# Patient Record
Sex: Female | Born: 1960 | Race: White | Hispanic: No | Marital: Married | Smoking: Never smoker
Health system: Southern US, Community
[De-identification: ages and names within clinical notes are randomized; demographics above are authoritative.]

## PROBLEM LIST (undated history)

## (undated) DIAGNOSIS — M1990 Unspecified osteoarthritis, unspecified site: Secondary | ICD-10-CM

## (undated) DIAGNOSIS — G47419 Narcolepsy without cataplexy: Secondary | ICD-10-CM

## (undated) DIAGNOSIS — G43909 Migraine, unspecified, not intractable, without status migrainosus: Secondary | ICD-10-CM

## (undated) DIAGNOSIS — R351 Nocturia: Secondary | ICD-10-CM

## (undated) DIAGNOSIS — R112 Nausea with vomiting, unspecified: Secondary | ICD-10-CM

## (undated) DIAGNOSIS — E569 Vitamin deficiency, unspecified: Secondary | ICD-10-CM

## (undated) DIAGNOSIS — M329 Systemic lupus erythematosus, unspecified: Secondary | ICD-10-CM

## (undated) DIAGNOSIS — G4710 Hypersomnia, unspecified: Secondary | ICD-10-CM

## (undated) DIAGNOSIS — M8580 Other specified disorders of bone density and structure, unspecified site: Secondary | ICD-10-CM

## (undated) DIAGNOSIS — Z8489 Family history of other specified conditions: Secondary | ICD-10-CM

## (undated) DIAGNOSIS — N903 Dysplasia of vulva, unspecified: Secondary | ICD-10-CM

## (undated) DIAGNOSIS — G4730 Sleep apnea, unspecified: Secondary | ICD-10-CM

## (undated) DIAGNOSIS — Z9889 Other specified postprocedural states: Secondary | ICD-10-CM

## (undated) DIAGNOSIS — Z87442 Personal history of urinary calculi: Secondary | ICD-10-CM

## (undated) DIAGNOSIS — Z87820 Personal history of traumatic brain injury: Secondary | ICD-10-CM

## (undated) DIAGNOSIS — E039 Hypothyroidism, unspecified: Secondary | ICD-10-CM

## (undated) DIAGNOSIS — T148XXA Other injury of unspecified body region, initial encounter: Secondary | ICD-10-CM

## (undated) DIAGNOSIS — Z9289 Personal history of other medical treatment: Secondary | ICD-10-CM

## (undated) DIAGNOSIS — H3530 Unspecified macular degeneration: Secondary | ICD-10-CM

## (undated) DIAGNOSIS — Z973 Presence of spectacles and contact lenses: Secondary | ICD-10-CM

## (undated) DIAGNOSIS — G245 Blepharospasm: Secondary | ICD-10-CM

## (undated) DIAGNOSIS — I7300 Raynaud's syndrome without gangrene: Secondary | ICD-10-CM

## (undated) DIAGNOSIS — J302 Other seasonal allergic rhinitis: Secondary | ICD-10-CM

## (undated) DIAGNOSIS — N200 Calculus of kidney: Secondary | ICD-10-CM

## (undated) HISTORY — PX: SHOULDER ARTHROSCOPY WITH SUBACROMIAL DECOMPRESSION: SHX5684

## (undated) HISTORY — DX: Vitamin deficiency, unspecified: E56.9

## (undated) HISTORY — DX: Calculus of kidney: N20.0

## (undated) HISTORY — DX: Sleep apnea, unspecified: G47.30

## (undated) HISTORY — DX: Unspecified osteoarthritis, unspecified site: M19.90

## (undated) HISTORY — PX: CYSTOSCOPY/RETROGRADE/URETEROSCOPY/STONE EXTRACTION WITH BASKET: SHX5317

## (undated) HISTORY — DX: Other specified disorders of bone density and structure, unspecified site: M85.80

## (undated) HISTORY — PX: ABDOMINAL HYSTERECTOMY: SHX81

## (undated) HISTORY — DX: Raynaud's syndrome without gangrene: I73.00

## (undated) HISTORY — DX: Migraine, unspecified, not intractable, without status migrainosus: G43.909

## (undated) HISTORY — DX: Hypersomnia, unspecified: G47.10

## (undated) HISTORY — PX: WRIST GANGLION EXCISION: SUR520

---

## 1994-05-26 HISTORY — PX: TOTAL ABDOMINAL HYSTERECTOMY W/ BILATERAL SALPINGOOPHORECTOMY: SHX83

## 2002-07-25 ENCOUNTER — Other Ambulatory Visit: Admission: RE | Admit: 2002-07-25 | Discharge: 2002-07-25 | Payer: Self-pay | Admitting: Obstetrics and Gynecology

## 2002-11-29 ENCOUNTER — Encounter: Payer: Self-pay | Admitting: Family Medicine

## 2002-11-29 ENCOUNTER — Ambulatory Visit (HOSPITAL_COMMUNITY): Admission: RE | Admit: 2002-11-29 | Discharge: 2002-11-29 | Payer: Self-pay | Admitting: Family Medicine

## 2003-05-28 ENCOUNTER — Emergency Department (HOSPITAL_COMMUNITY): Admission: AD | Admit: 2003-05-28 | Discharge: 2003-05-28 | Payer: Self-pay | Admitting: Family Medicine

## 2003-08-15 ENCOUNTER — Encounter: Admission: RE | Admit: 2003-08-15 | Discharge: 2003-08-15 | Payer: Self-pay | Admitting: Obstetrics and Gynecology

## 2003-10-31 ENCOUNTER — Ambulatory Visit (HOSPITAL_BASED_OUTPATIENT_CLINIC_OR_DEPARTMENT_OTHER): Admission: RE | Admit: 2003-10-31 | Discharge: 2003-10-31 | Payer: Self-pay | Admitting: Orthopedic Surgery

## 2004-08-27 ENCOUNTER — Encounter: Admission: RE | Admit: 2004-08-27 | Discharge: 2004-08-27 | Payer: Self-pay | Admitting: Obstetrics and Gynecology

## 2005-10-07 ENCOUNTER — Encounter: Admission: RE | Admit: 2005-10-07 | Discharge: 2005-10-07 | Payer: Self-pay | Admitting: Obstetrics and Gynecology

## 2005-10-07 IMAGING — MG MM SCREEN MAMMOGRAM BILATERAL
5 series · 5 of 5 positions shown · non-contrast
Comparison: none

DG SCREEN MAMMOGRAM BILATERAL
Bilateral CC and MLO view(s) were taken.

SCREENING MAMMOGRAM:
Comparison is made to previous study dated [DATE].
There are scattered fibroglandular densities.  There is no dominant mass, architectural distortion,
or calcification to suggest malignancy.

[R CC (1 of 2)]
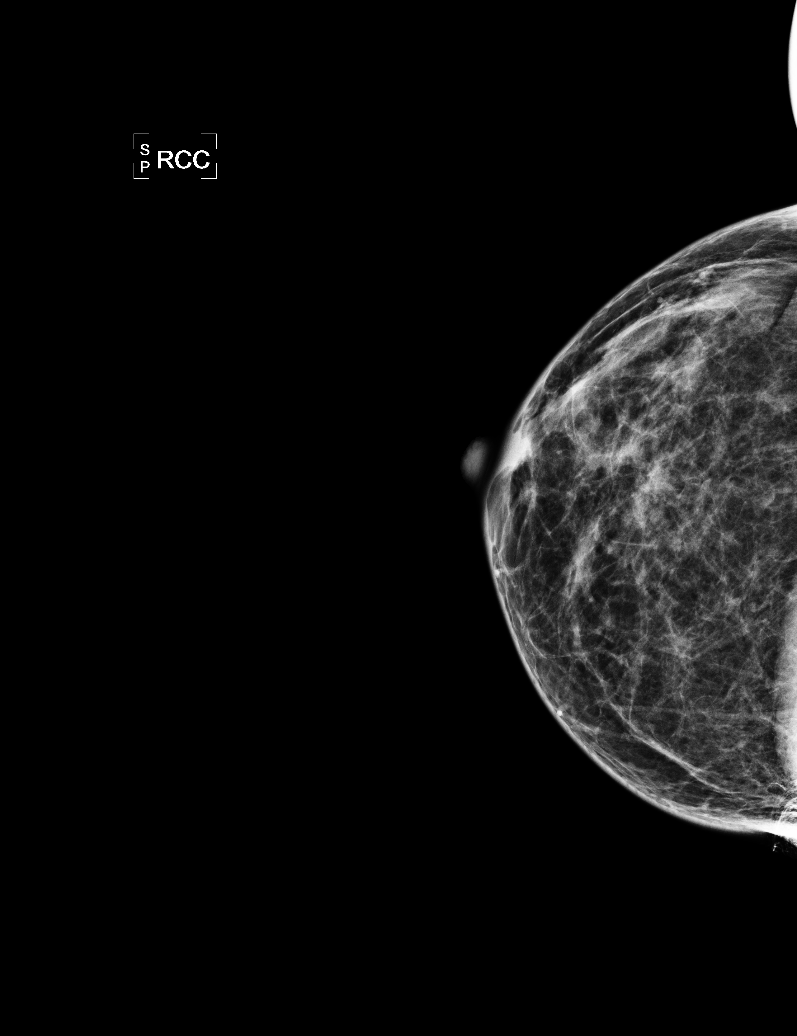

[L CC]
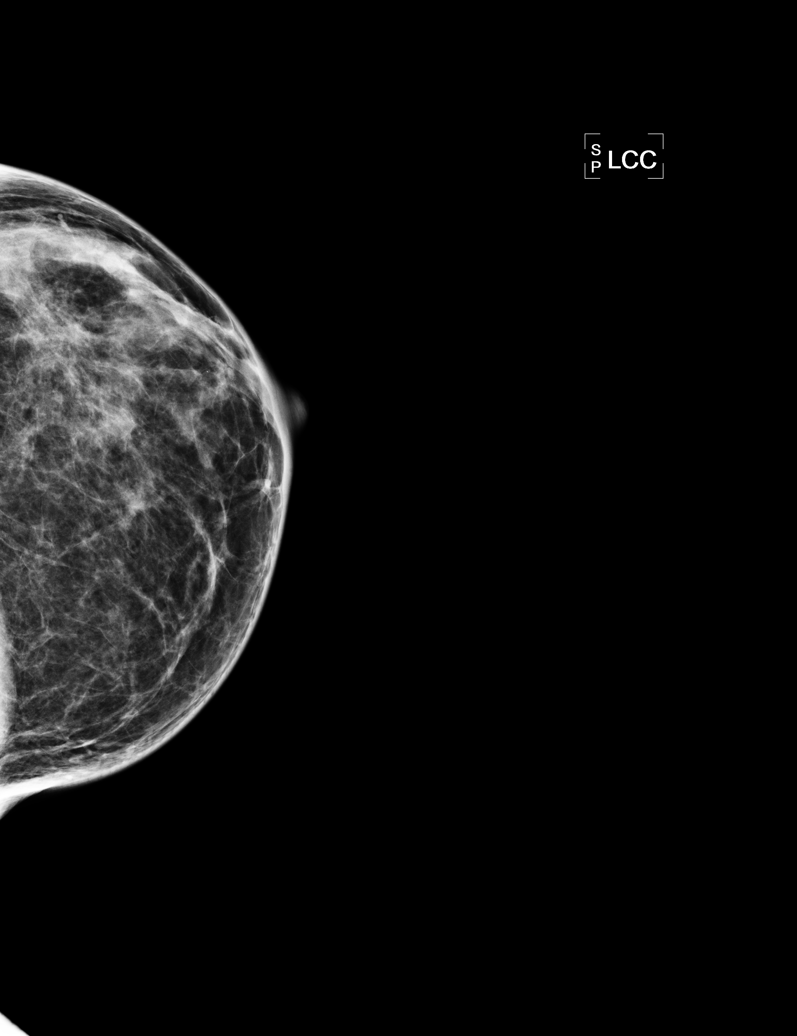

[L MLO]
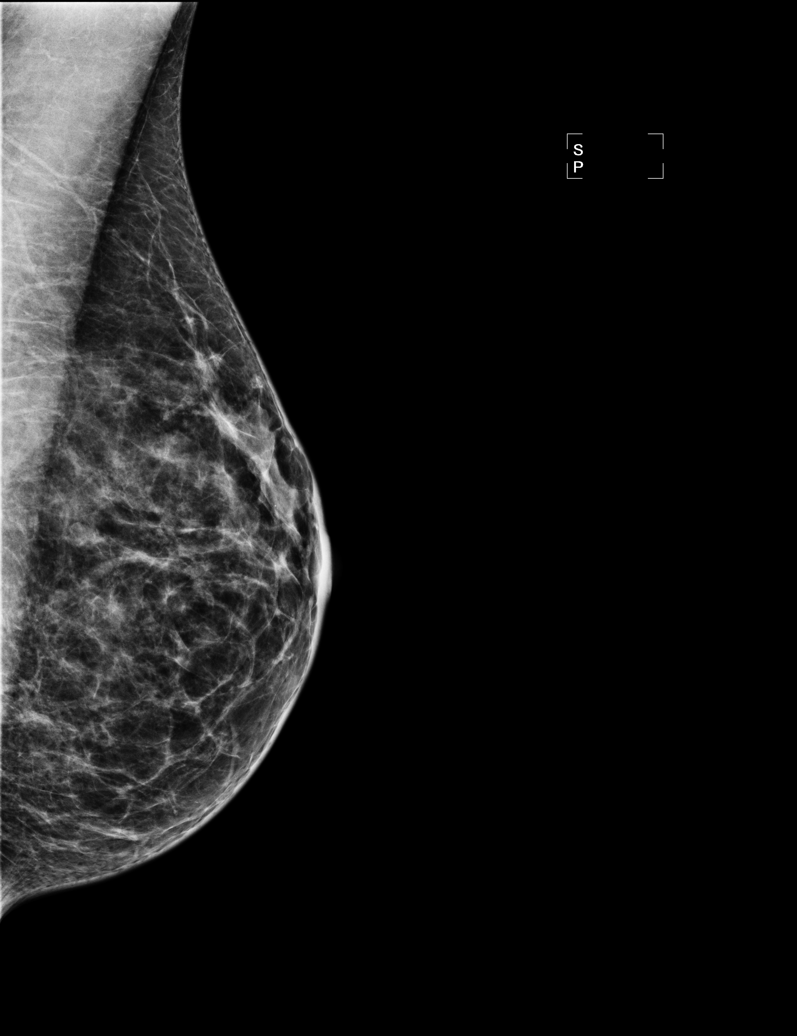

[R MLO]
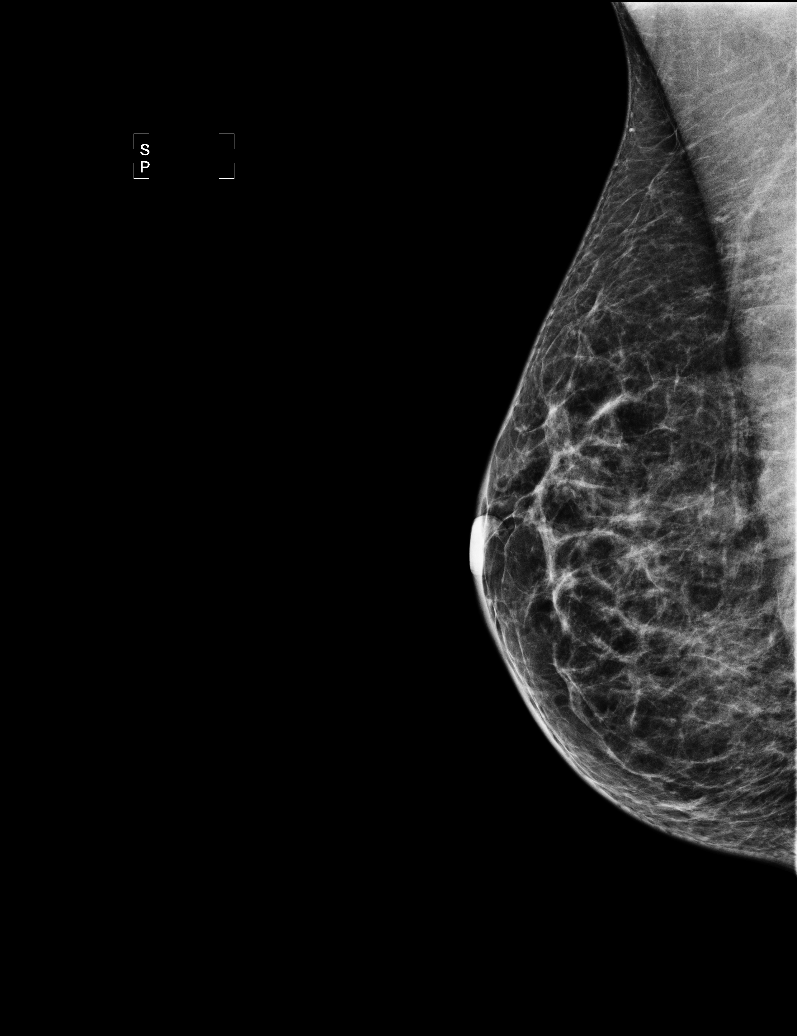

[R CC (2 of 2)]
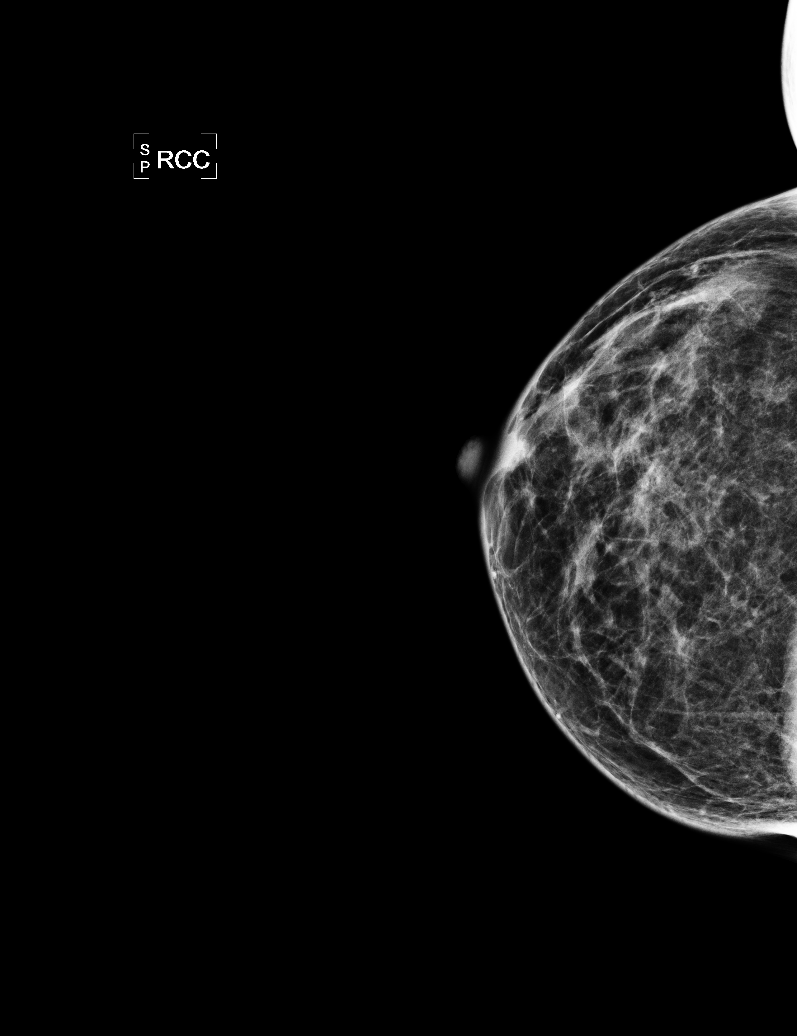

[5 of 5 positions shown; findings below may reference images not displayed]

IMPRESSION: No mammographic evidence of malignancy.  Suggest screening mammography in one year.

ASSESSMENT: Negative - BI-RADS 1

Screening mammogram in 1 year.
ANALYZED BY COMPUTER AIDED DETECTION. , THIS PROCEDURE WAS A DIGITAL MAMMOGRAM.

## 2005-11-11 ENCOUNTER — Encounter: Admission: RE | Admit: 2005-11-11 | Discharge: 2005-11-11 | Payer: Self-pay | Admitting: Family Medicine

## 2005-11-11 IMAGING — CR DG CHEST 2V
3 series · 3 of 3 positions shown · non-contrast
Comparison: none

CLINICAL DATA: Cough for several months. 
 CHEST X-RAY: 
 Two views of the chest show the lungs to be clear but hyperaerated.  No active infiltrate of effusion is seen.  The heart is within normal limits in size.

[view not recorded (1 of 3)]
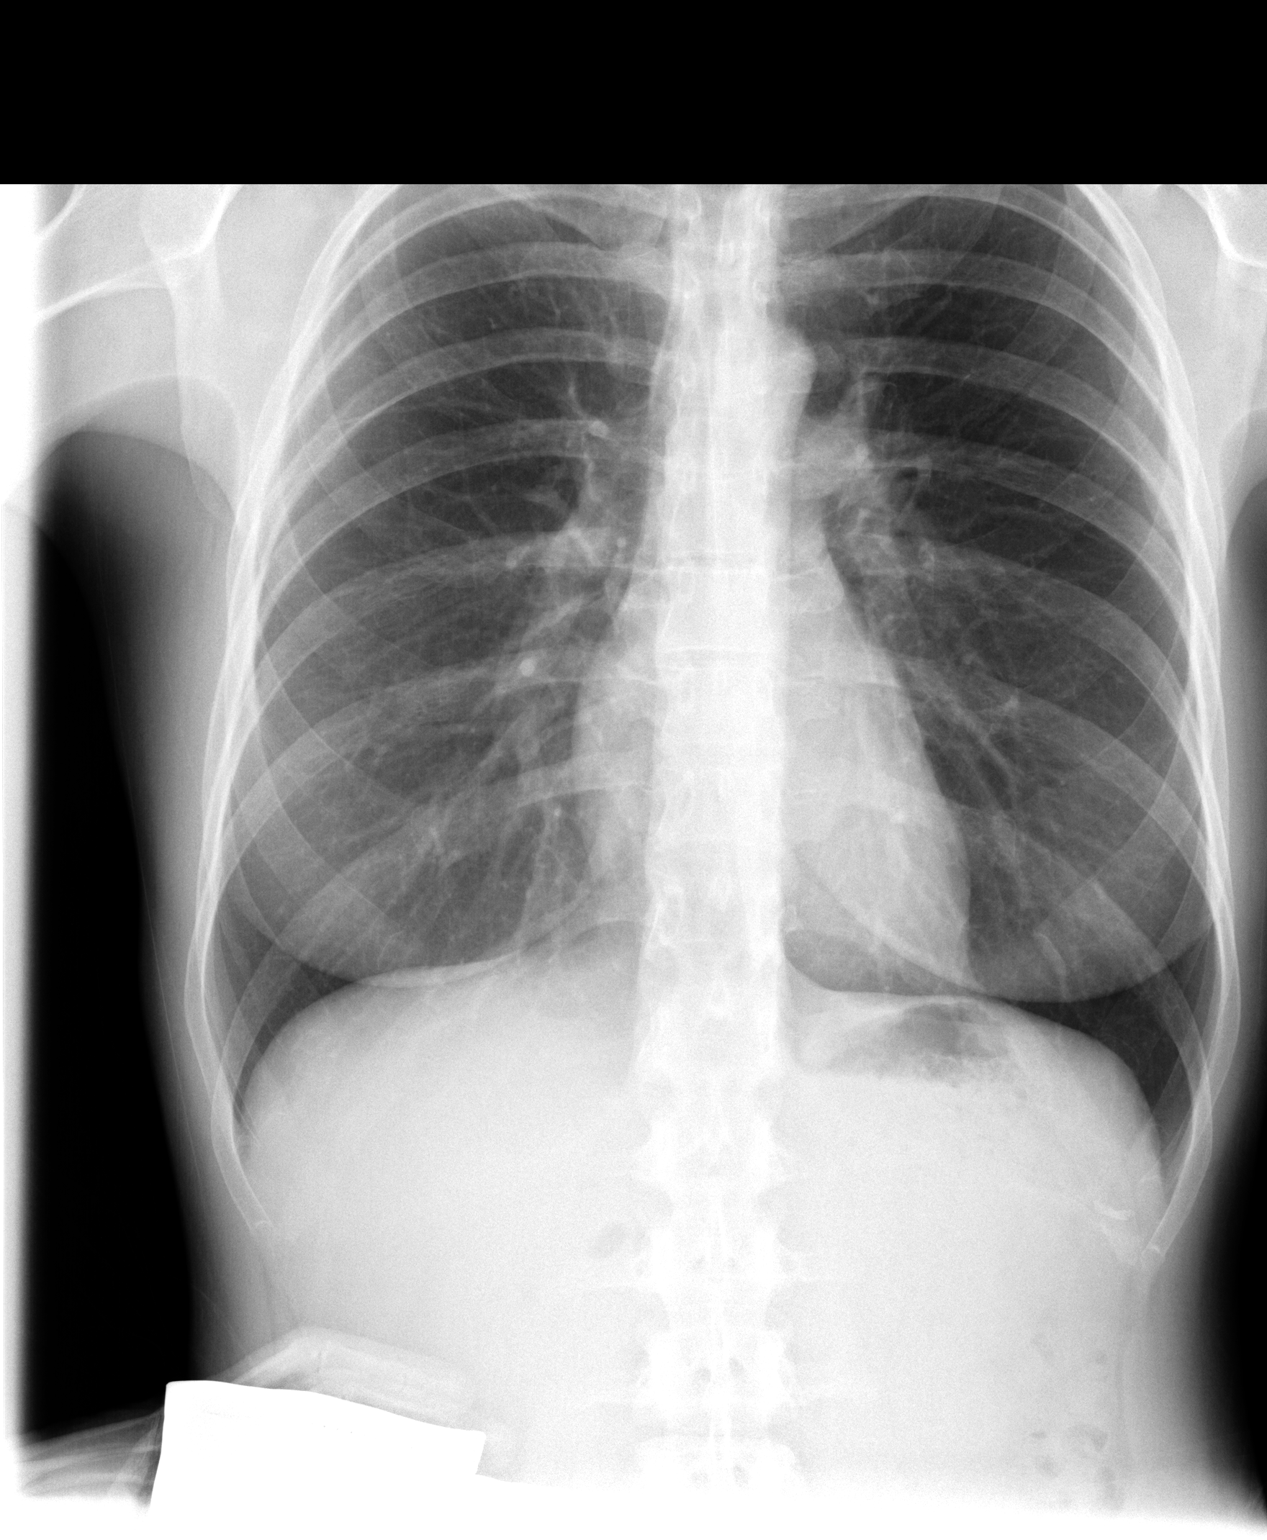

[view not recorded (2 of 3)]
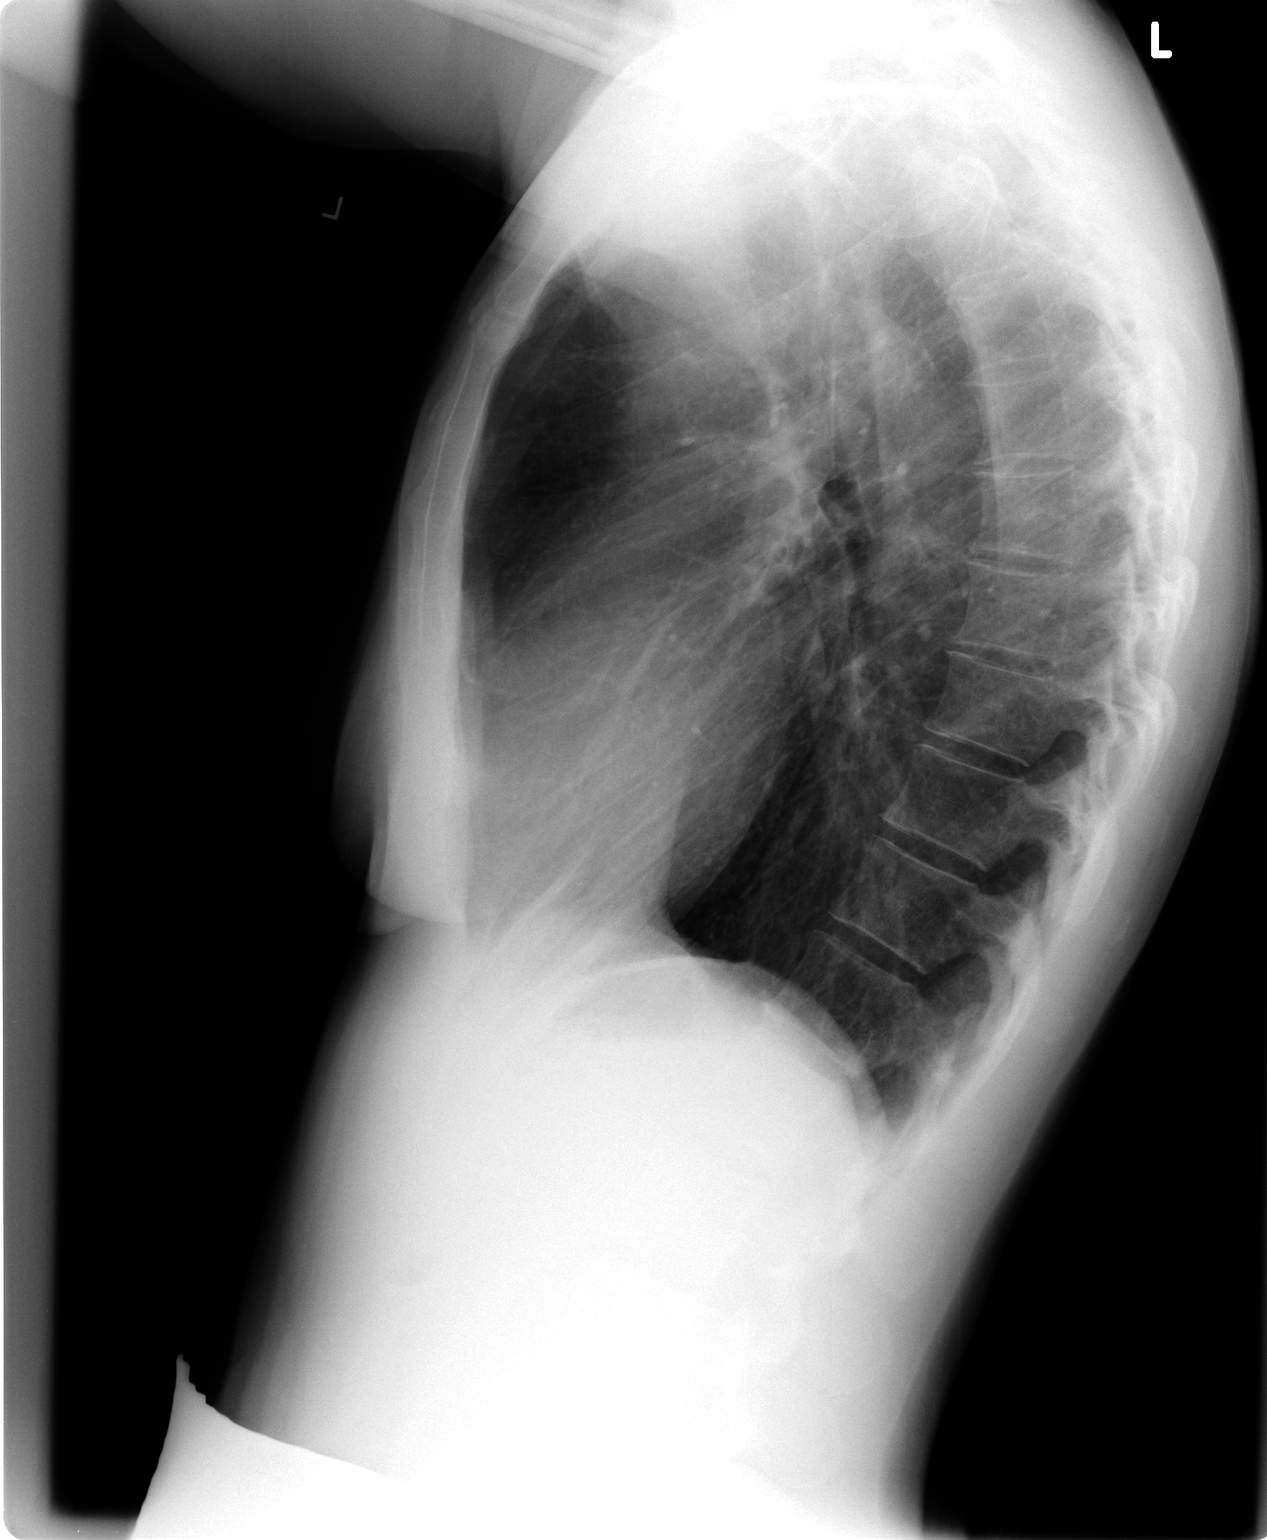

[view not recorded (3 of 3)]
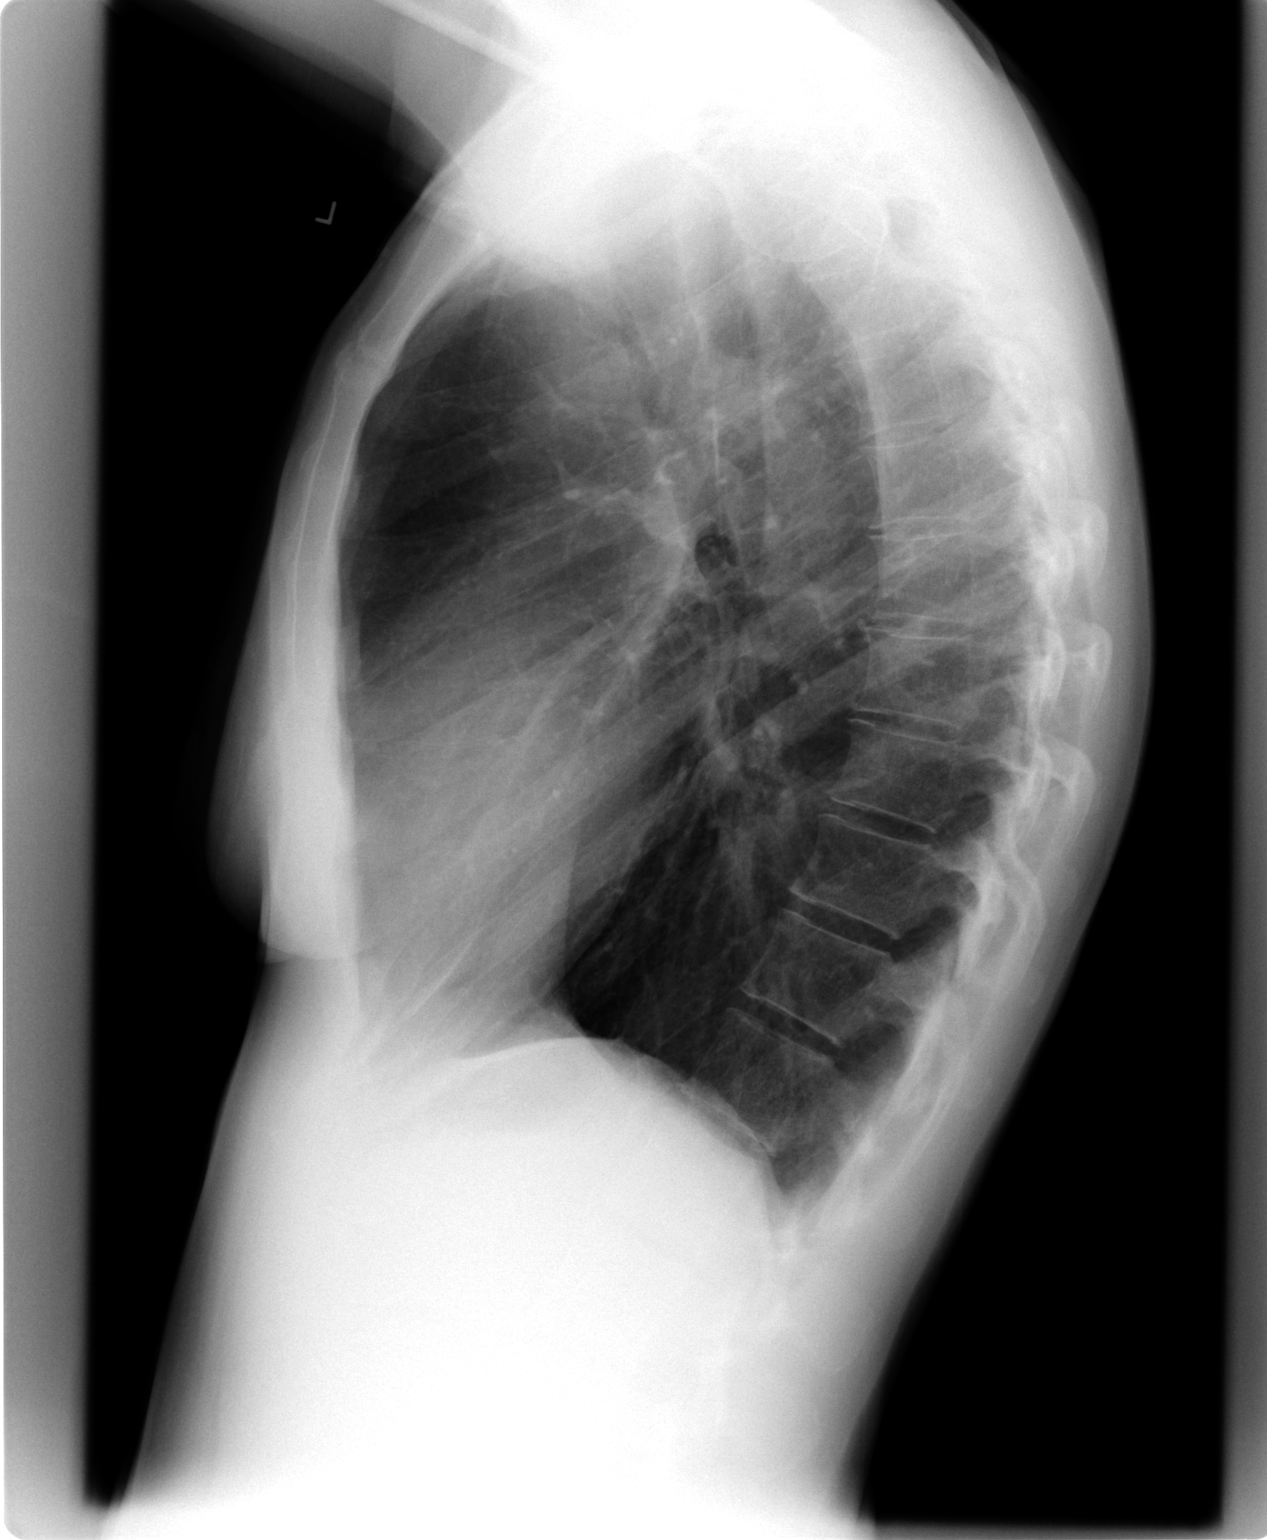

[3 of 3 positions shown; findings below may reference images not displayed]

IMPRESSION: No active lung disease.  Lungs are hyperaerated.

## 2006-02-25 ENCOUNTER — Encounter: Admission: RE | Admit: 2006-02-25 | Discharge: 2006-02-25 | Payer: Self-pay | Admitting: Cardiology

## 2006-02-25 IMAGING — CR DG CHEST 2V
2 series · 2 of 2 positions shown · non-contrast
Comparison: [DATE].

CLINICAL DATA: Shortness of breath.  Dyspnea.  Nonsmoker. 
 CHEST - 2 VIEW:

[view not recorded (1 of 2)]
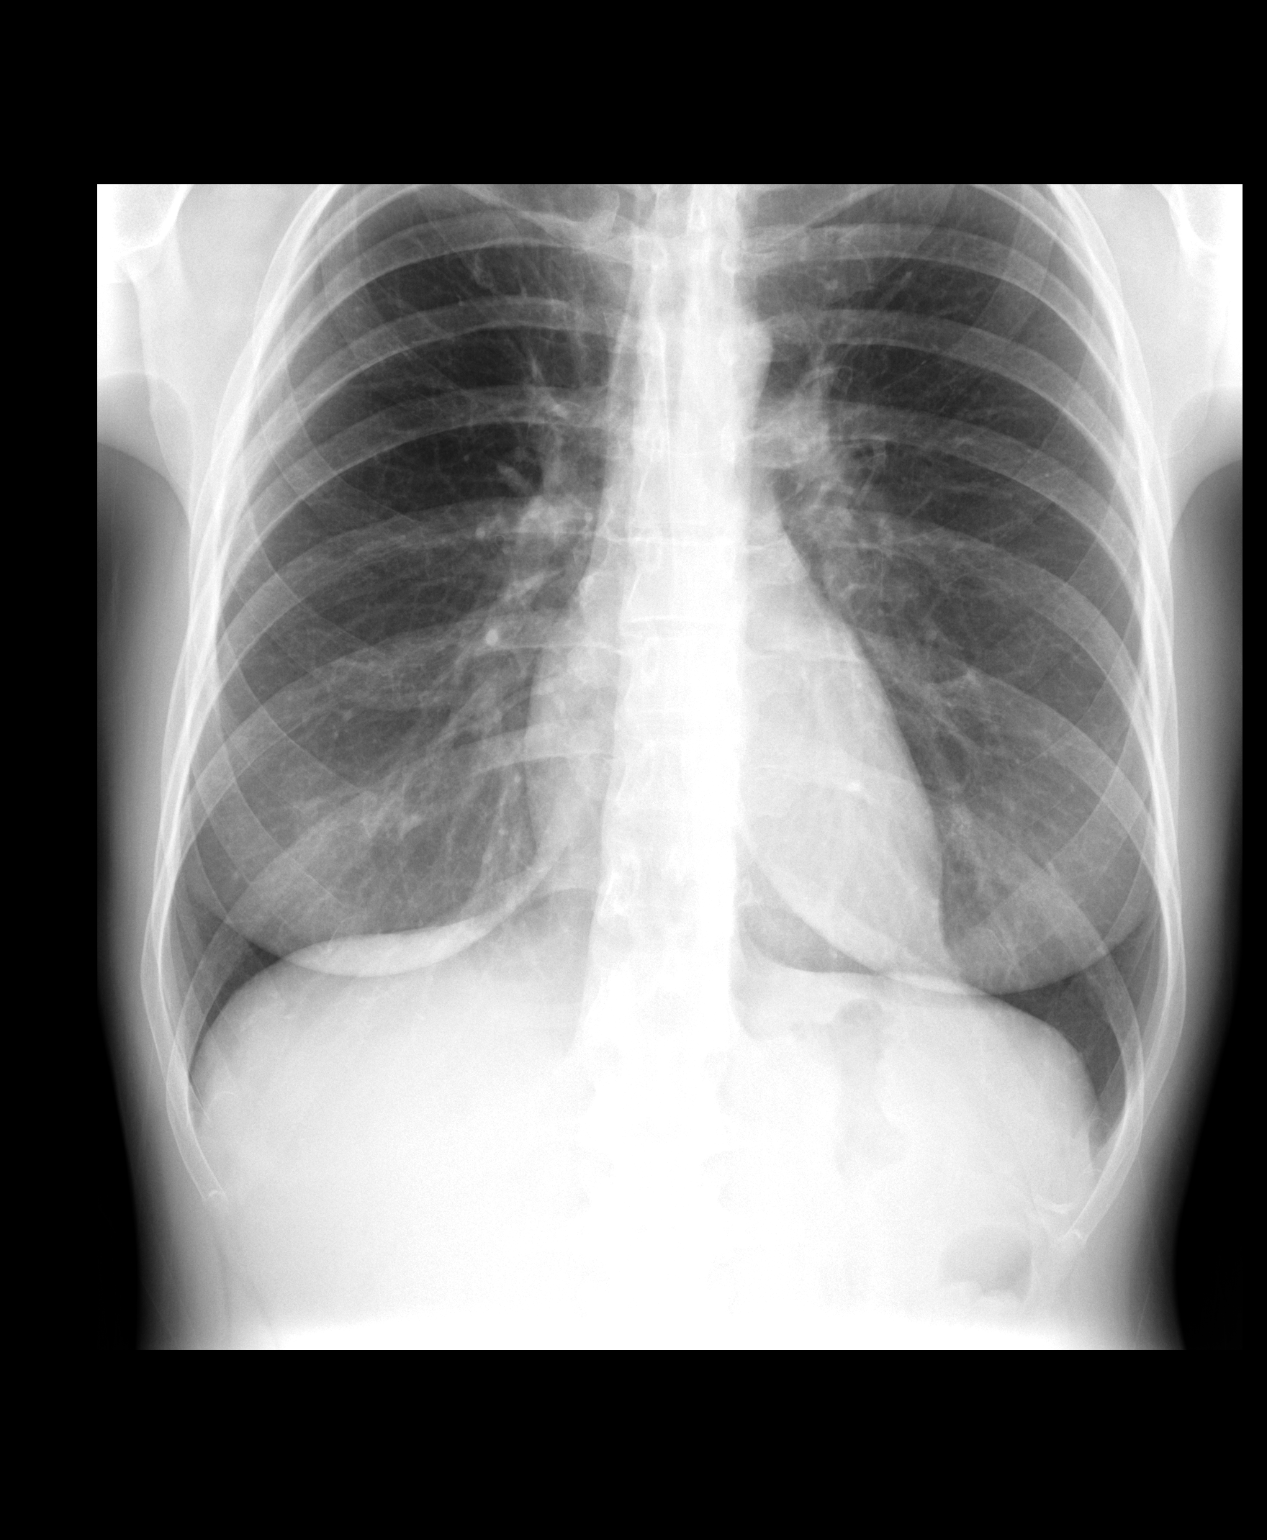

[view not recorded (2 of 2)]
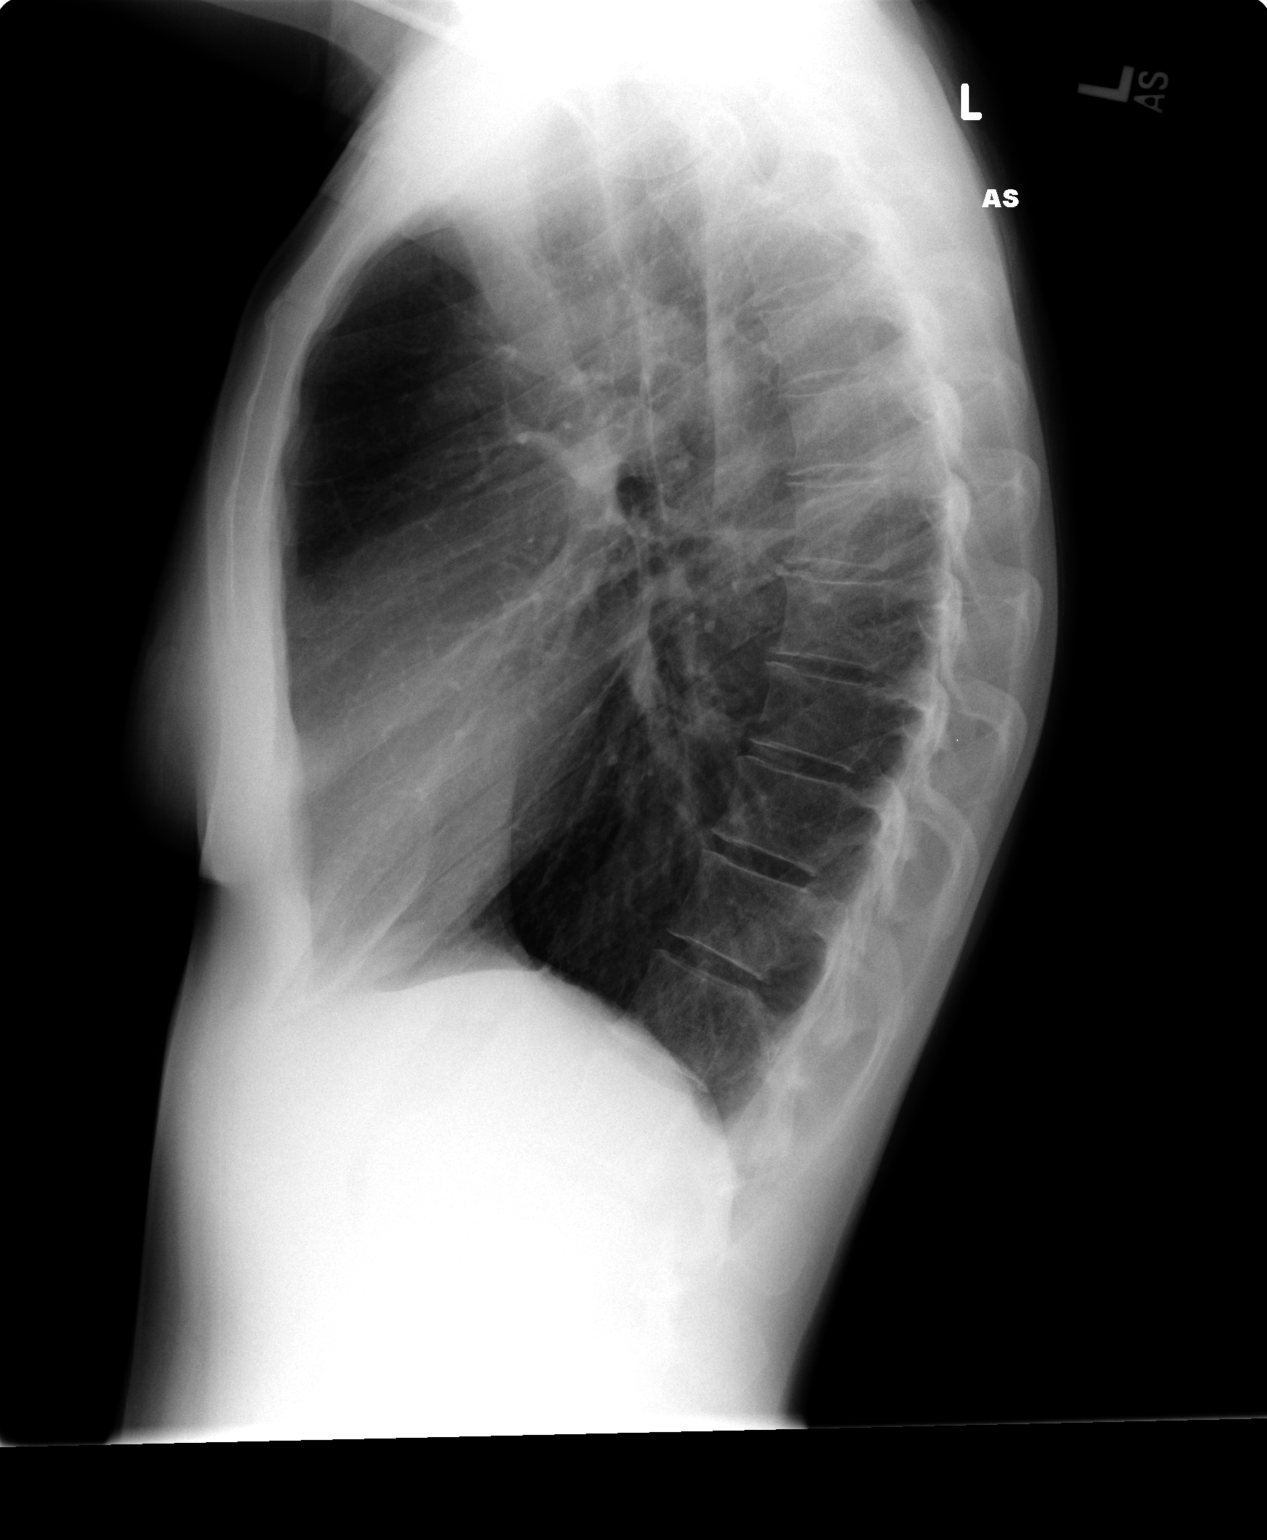

[2 of 2 positions shown; findings below may reference images not displayed]

FINDINGS: Pulmonary hyperinflation is again seen without change.  There is no evidence of pulmonary infiltrate or edema.  There is no evidence of pleural effusion.  Heart size and mediastinal contours are normal.  No mass or adenopathy identified.  No significant change is seen since prior study.
IMPRESSION: Stable chest.  No active disease.  Pulmonary hyperinflation again noted.

## 2006-03-24 ENCOUNTER — Encounter: Admission: RE | Admit: 2006-03-24 | Discharge: 2006-03-24 | Payer: Self-pay | Admitting: Cardiology

## 2006-03-24 IMAGING — CT CT CHEST W/ CM
2 of 3 series · 15 of 36 positions shown, 18 images · IV contrast (75ML OMNI 300)
Comparison: none

CLINICAL DATA: Short of breath.  History of Lupus.  Some chest pain. 
 CT CHEST WITH CONTRAST:
TECHNIQUE: Multidetector CT imaging of the chest was performed following the standard protocol during bolus administration of intravenous contrast.
 Contrast:  75 cc Omnipaque 300

[Series 3: routine chest · axial · 0.59mm/px · z∈[-298,-3]mm · 12 of 71 slices shown, 15 images]
[im 6/71  mediastinal]
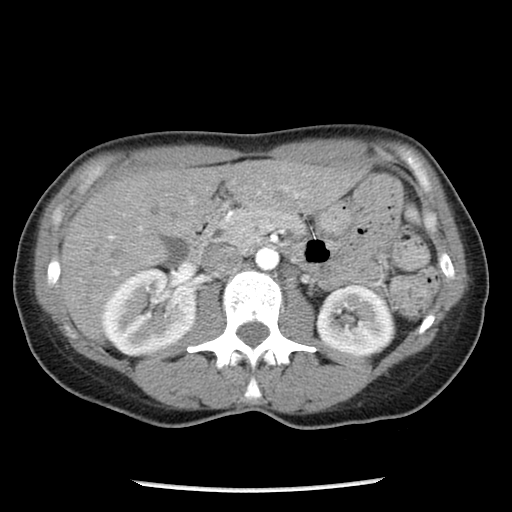
[im 6/71  lung]
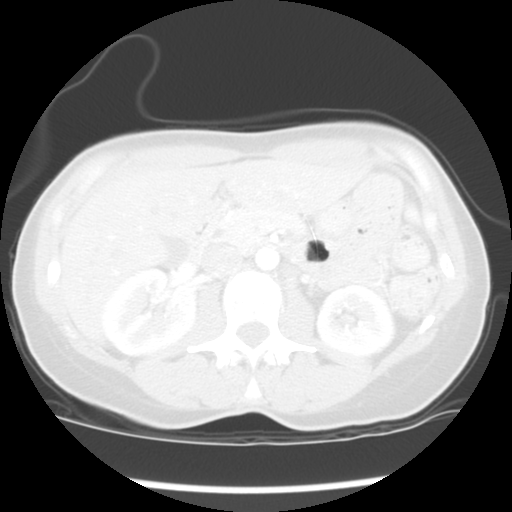
[im 11/71  lung]
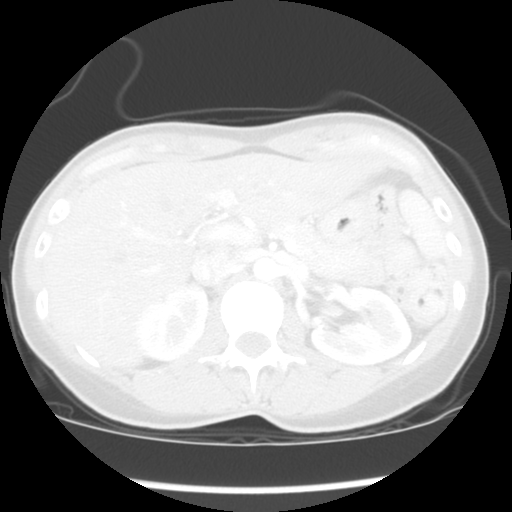
[im 16/71  lung]
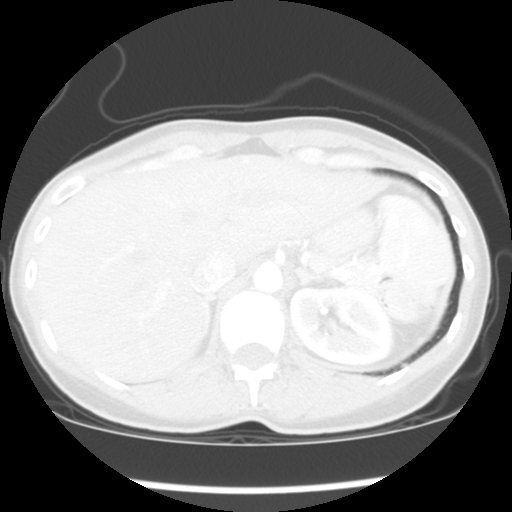
[im 21/71  lung]
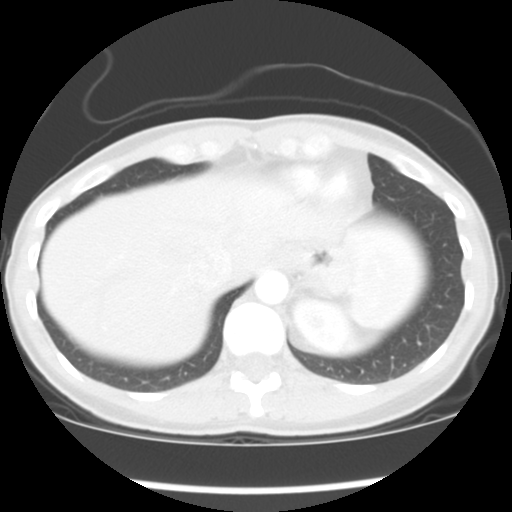
[im 26/71  mediastinal]
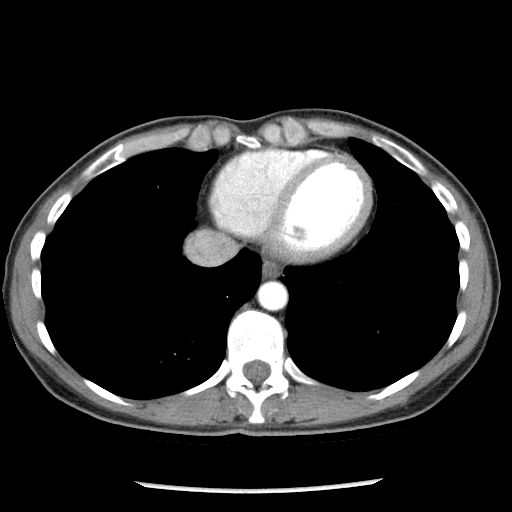
[im 26/71  lung]
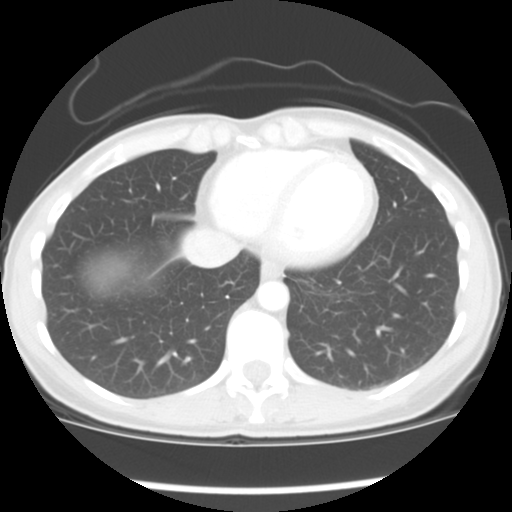
[im 32/71  lung]
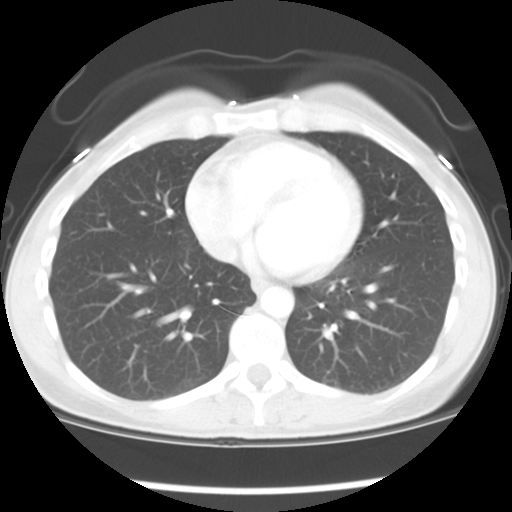
[im 39/71  lung]
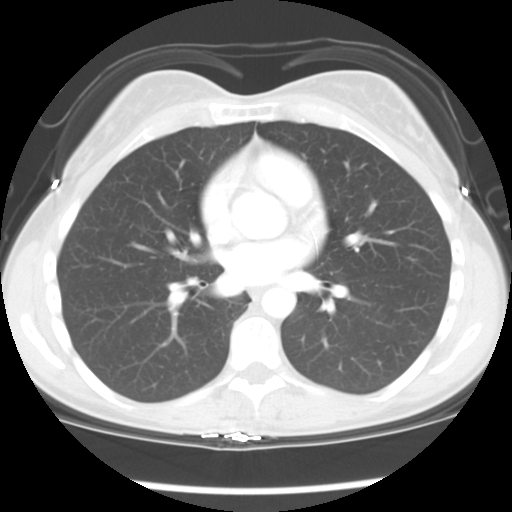
[im 45/71  lung]
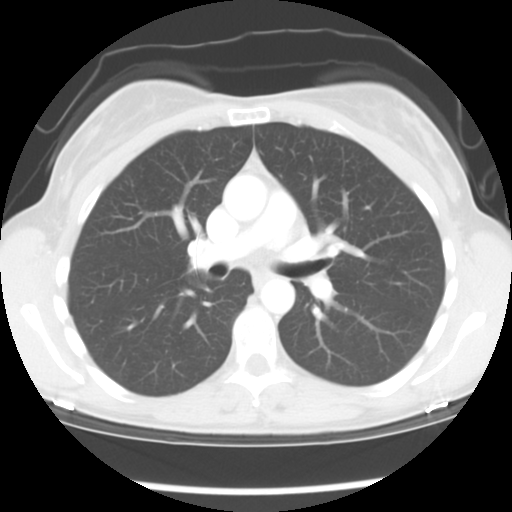
[im 50/71  mediastinal]
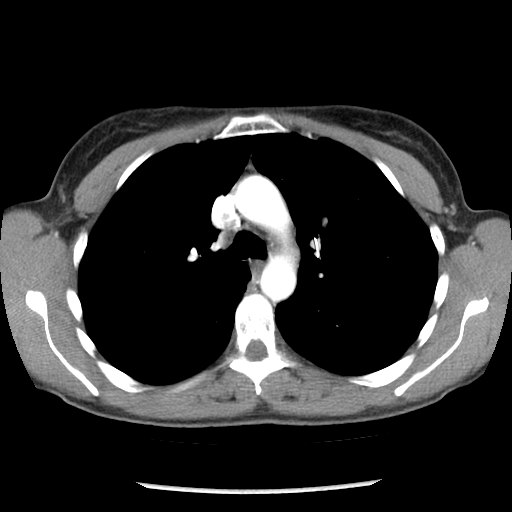
[im 50/71  lung]
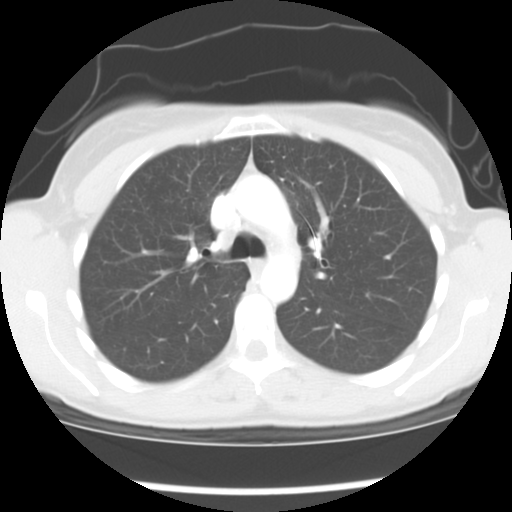
[im 55/71  lung]
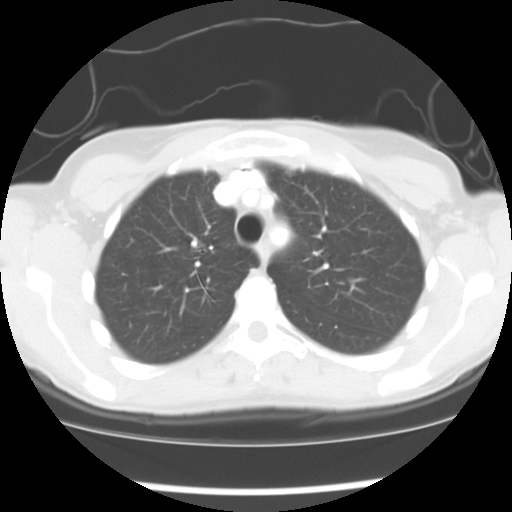
[im 60/71  lung]
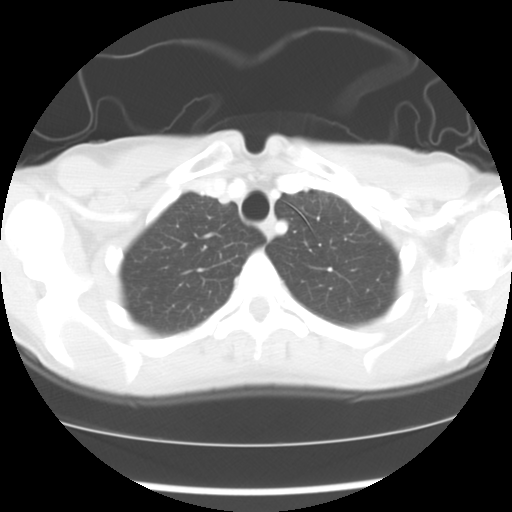
[im 65/71  lung]
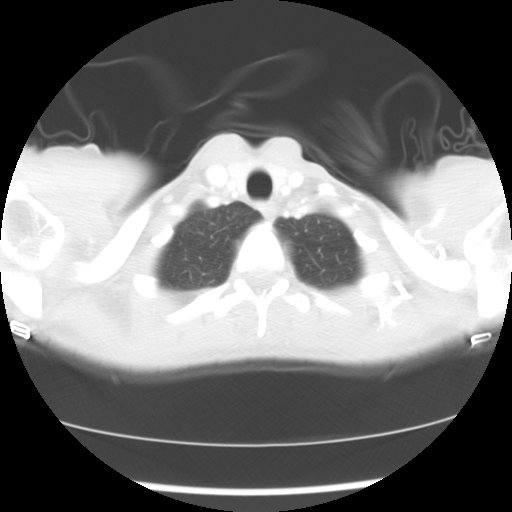

[Series 602: sagittal body · sagittal · 0.68mm/px · 3 of 122 slices shown]
[im 25/122  lung]
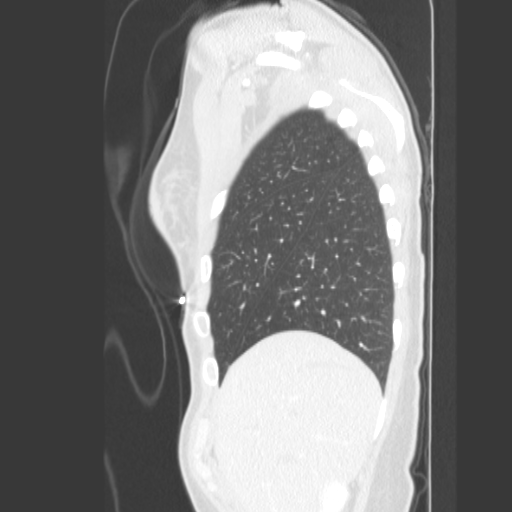
[im 49/122  lung]
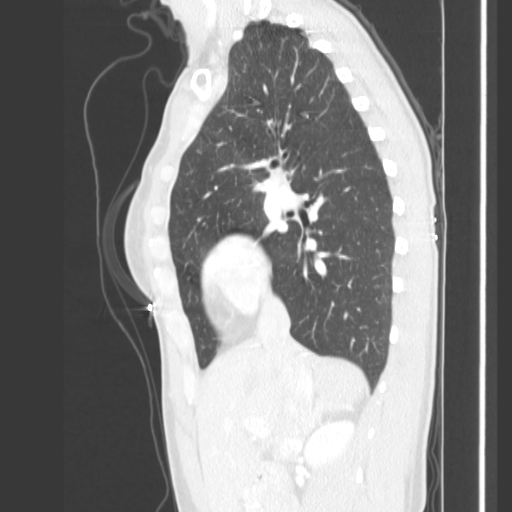
[im 73/122  lung]
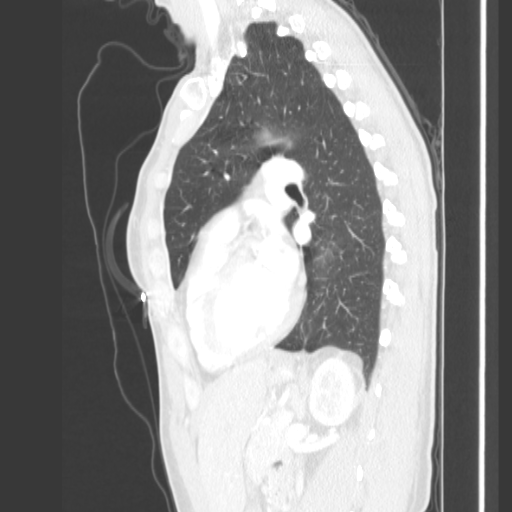

[15 of 36 positions shown; findings below may reference images not displayed]

FINDINGS: On lung window images, there is only a thin linear opacity in the right middle lobe on the minor fissure most consistent with scarring, best seen on sagittal image #[DATE].  No lung nodule is seen and no effusion is  noted.  The pulmonary arteries and thoracic aorta opacify with no significant abnormality.  No mediastinal or hilar adenopathy is seen.
IMPRESSION: No significant abnormality on CT of the chest.  Single linear area of probable scarring is noted along the minor fissure in the right middle lobe of doubtful significance.

## 2006-08-14 ENCOUNTER — Encounter: Admission: RE | Admit: 2006-08-14 | Discharge: 2006-08-14 | Payer: Self-pay | Admitting: Family Medicine

## 2006-08-14 IMAGING — US US ABDOMEN COMPLETE
1 series · 14 of 25 positions shown · non-contrast
Comparison: none

CLINICAL DATA: Right upper quadrant pain.  Epigastric pain.  
 COMPLETE ABDOMINAL ULTRASOUND:
TECHNIQUE: Complete abdominal ultrasound examination was performed including evaluation of the liver, gallbladder, bile ducts, pancreas, kidneys, spleen, IVC, and abdominal aorta.

[Series 1: unknown · 0.27mm/px · 14 of 91 slices shown]
[im 1/91]
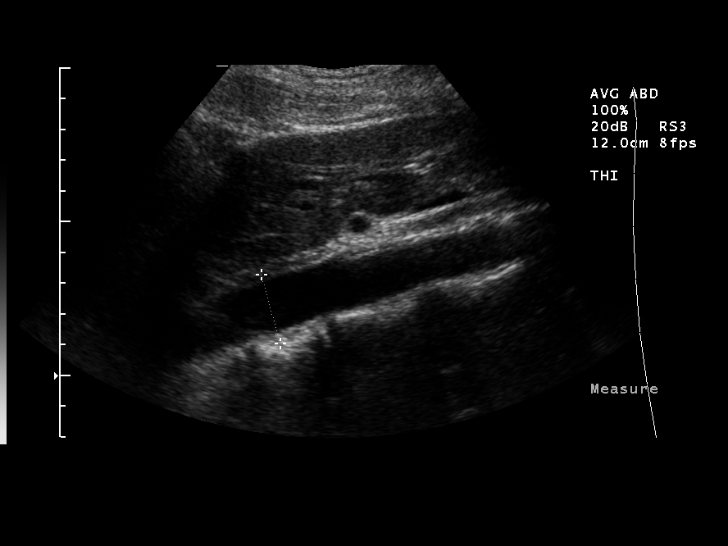
[im 8/91]
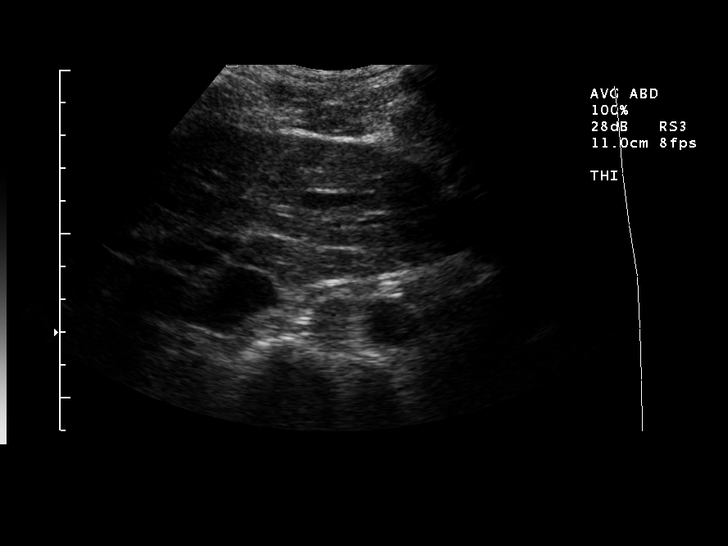
[im 16/91]
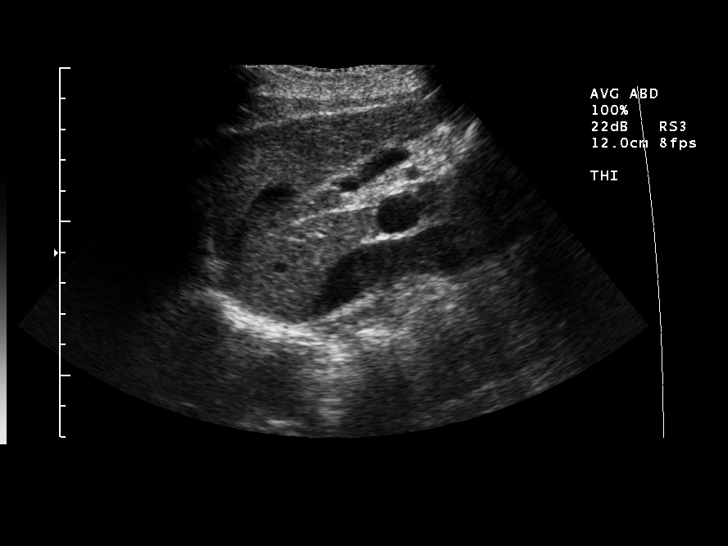
[im 23/91]
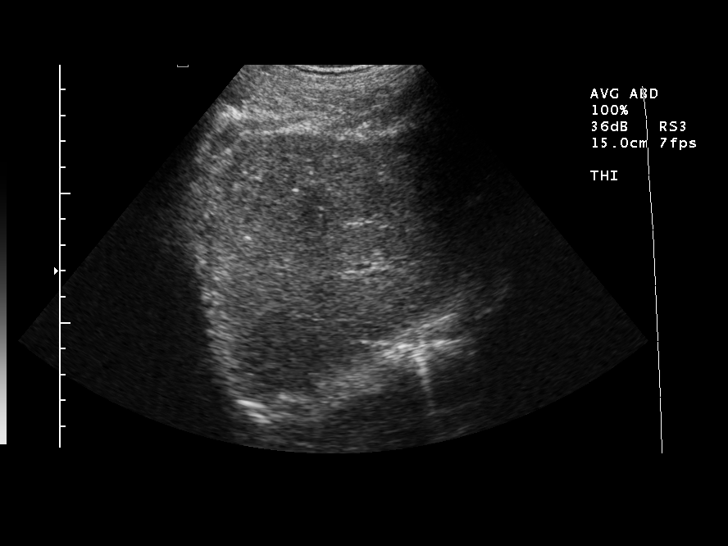
[im 31/91]
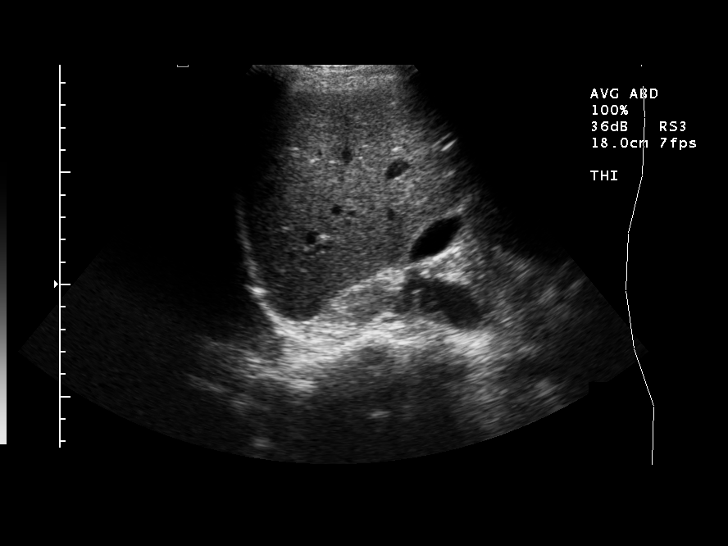
[im 34/91]
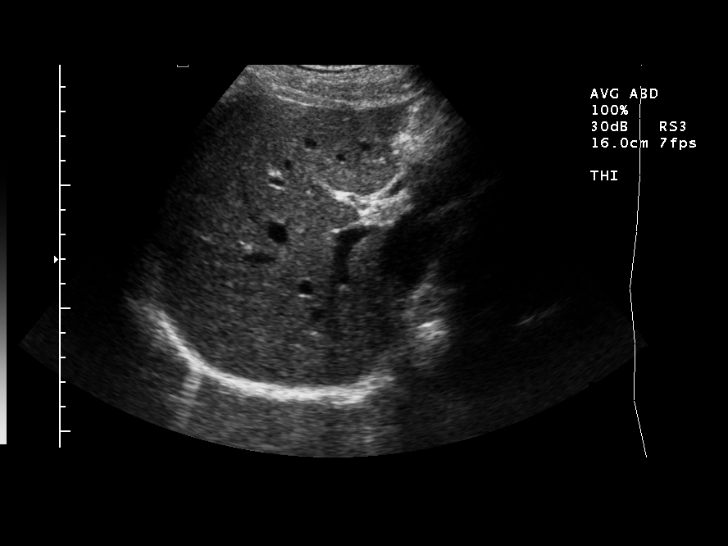
[im 42/91]
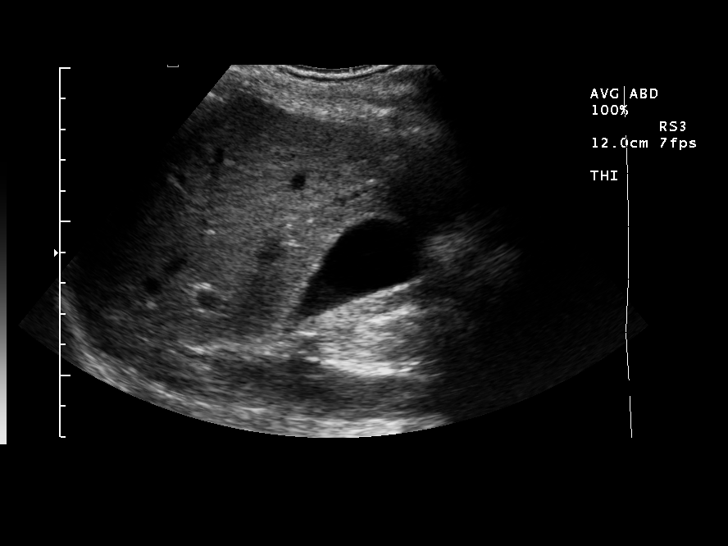
[im 49/91]
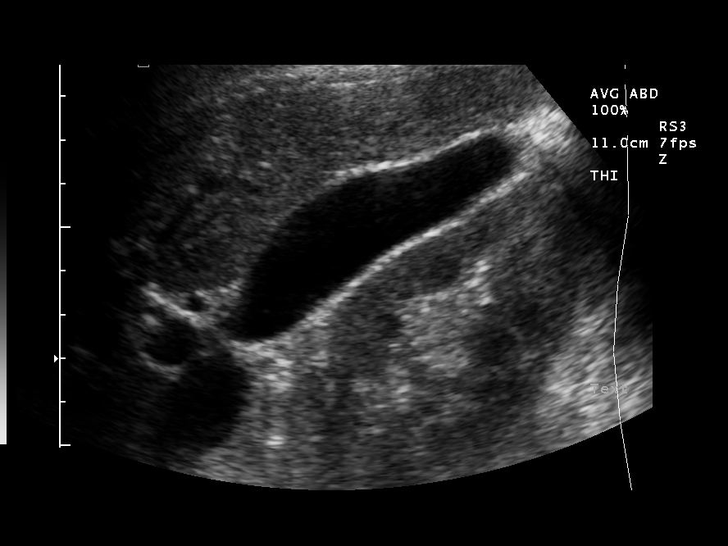
[im 57/91]
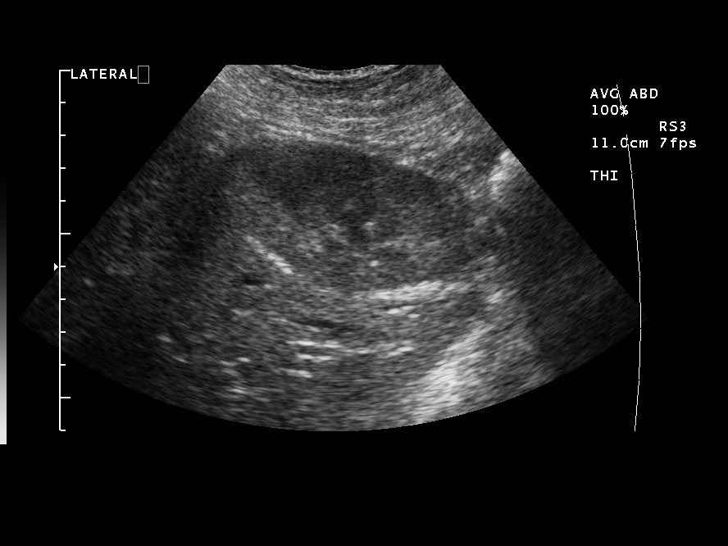
[im 61/91]
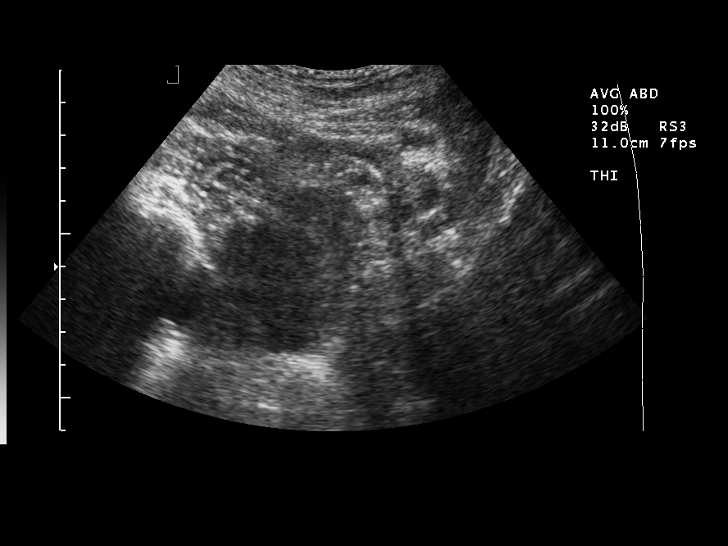
[im 68/91]
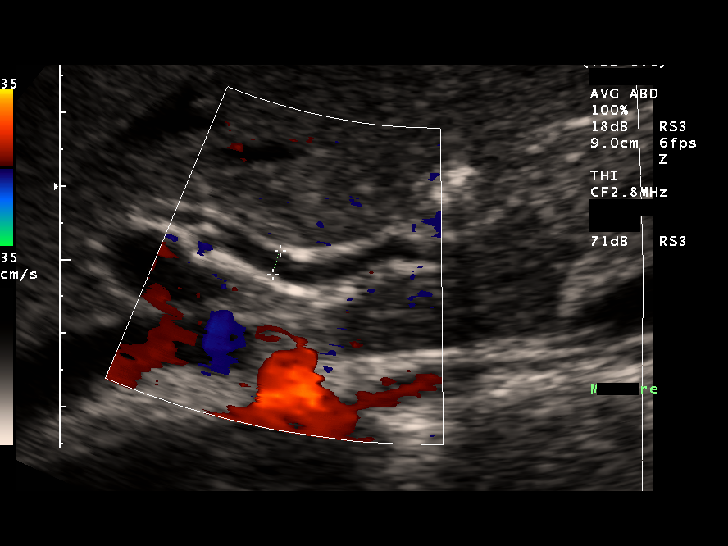
[im 76/91]
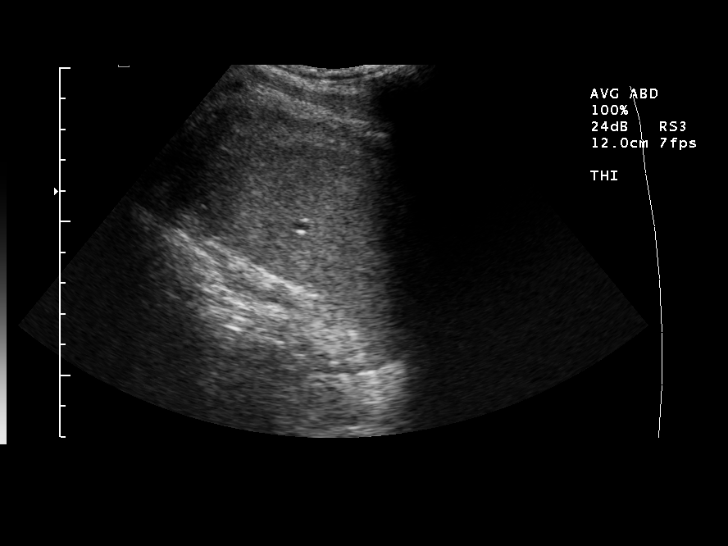
[im 83/91]
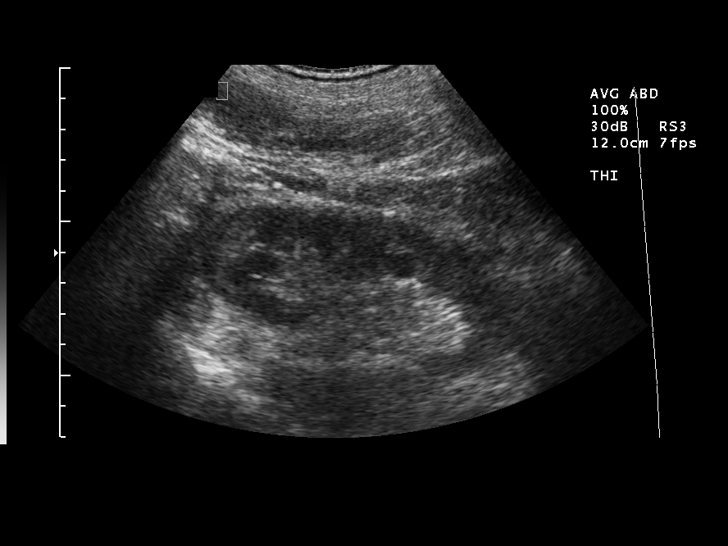
[im 91/91]
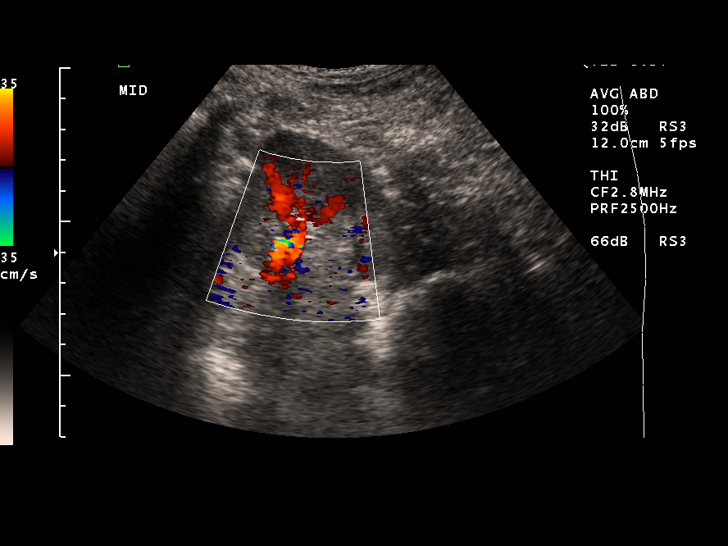

[14 of 25 positions shown; findings below may reference images not displayed]

FINDINGS: The gallbladder is well seen and no gallstones are noted.  The liver has a normal echogenic pattern.  The common bile duct is normal measuring 3.9mm in diameter.  The IVC, pancreas, and spleen appear normal.  No hydronephrosis is seen.  The right kidney measures 10.8cm sagittally with the left kidney measuring 11.3cm.  The abdominal aorta is normal in caliber.
IMPRESSION: Negative abdominal ultrasound.  No gallstones.

## 2006-10-13 ENCOUNTER — Encounter: Admission: RE | Admit: 2006-10-13 | Discharge: 2006-10-13 | Payer: Self-pay | Admitting: Obstetrics and Gynecology

## 2006-10-13 IMAGING — MG MM SCREEN MAMMOGRAM BILATERAL
4 series · 4 of 4 positions shown · non-contrast
Comparison: none

DG SCREEN MAMMOGRAM BILATERAL
Bilateral CC and MLO view(s) were taken.
Prior study comparison: [DATE], bilateral screening mammogram.

DIGITAL SCREENING MAMMOGRAM WITH CAD:
There are scattered fibroglandular densities.  There is no dominant mass, architectural distortion 
or calcification to suggest malignancy.

[R CC]
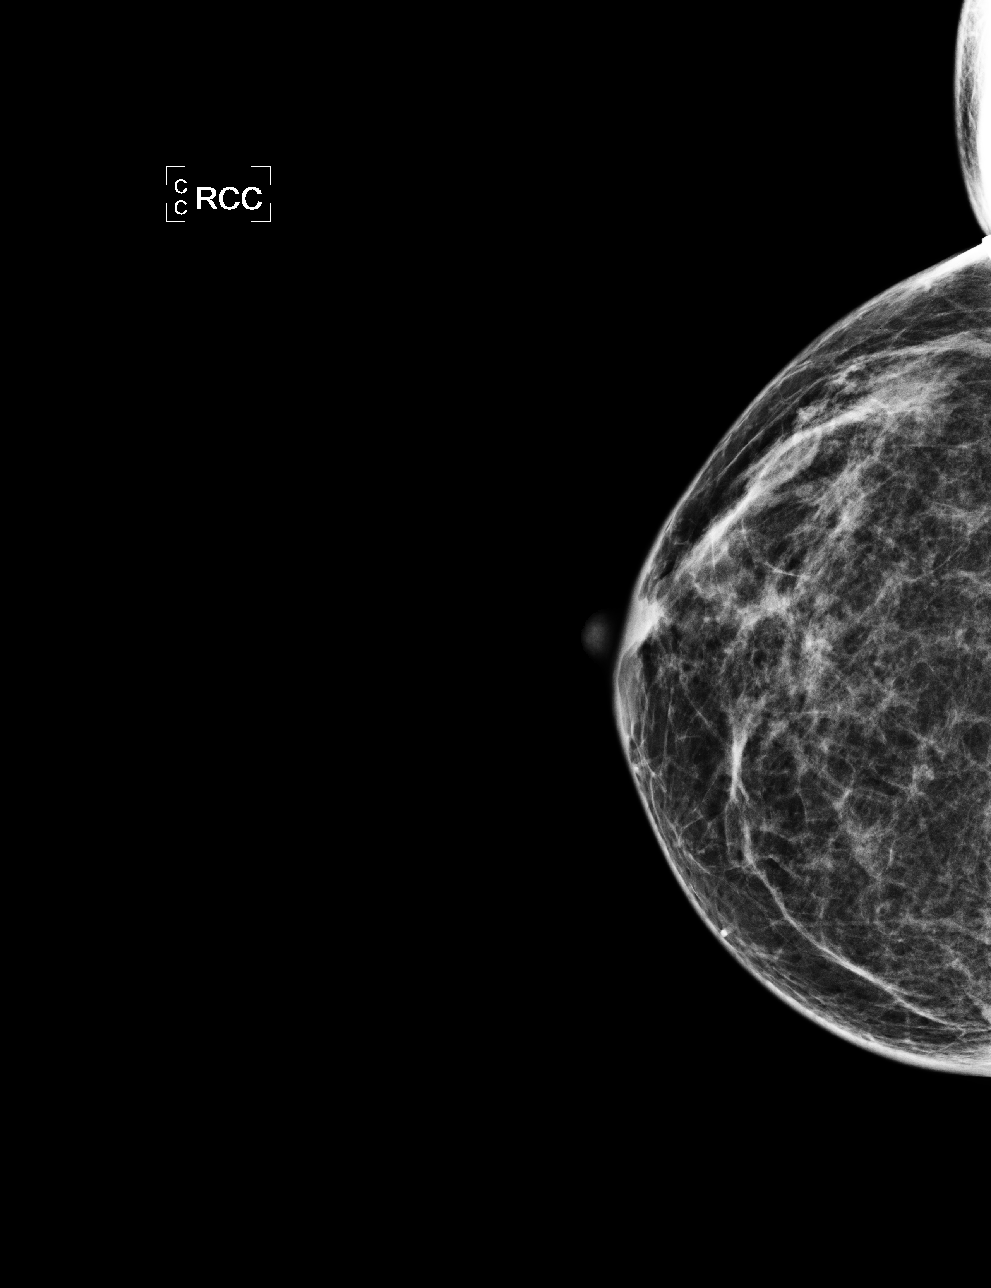

[L CC]
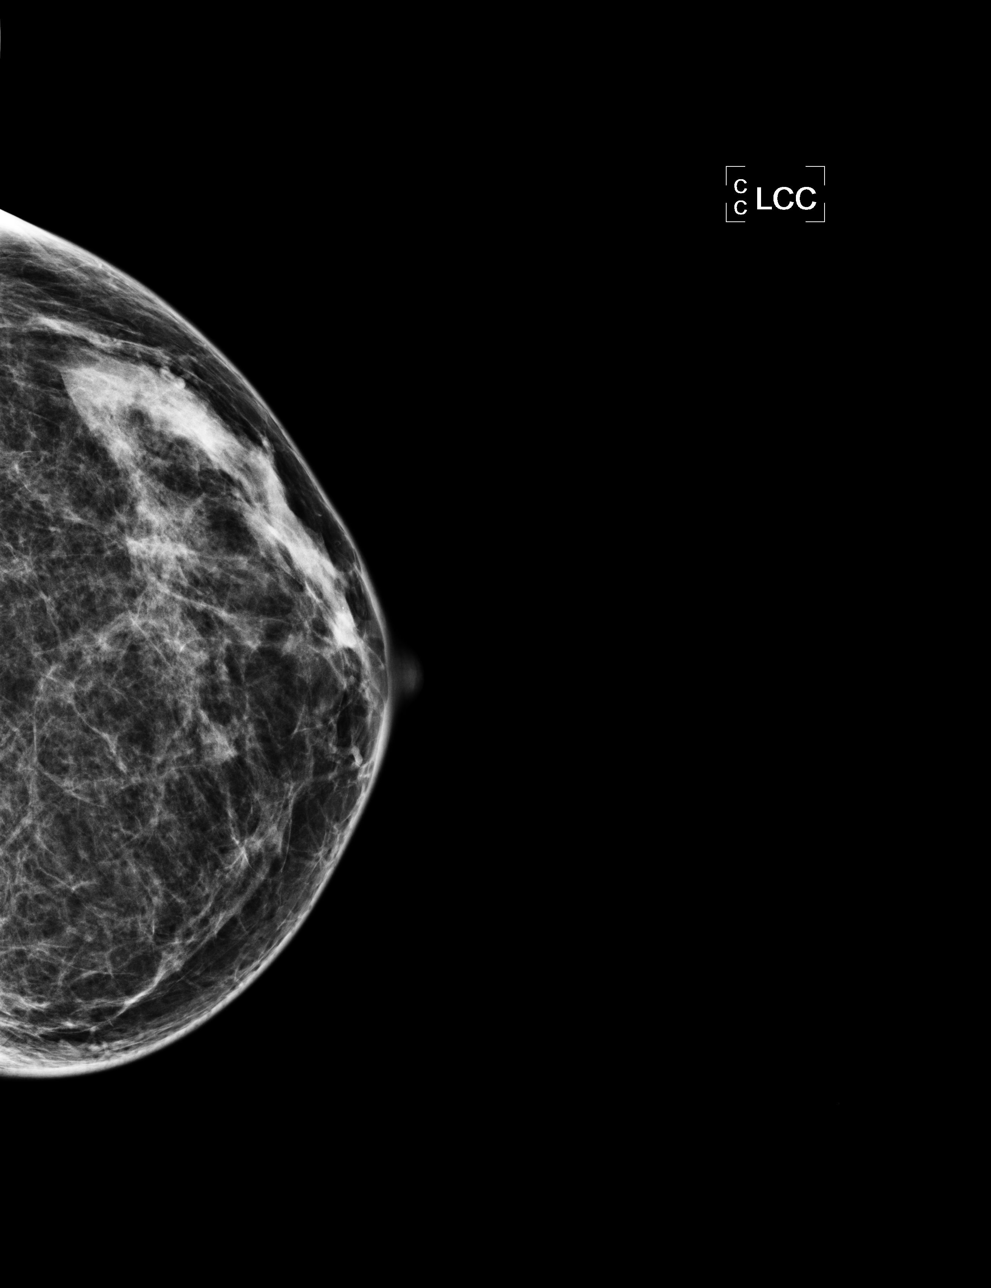

[L MLO]
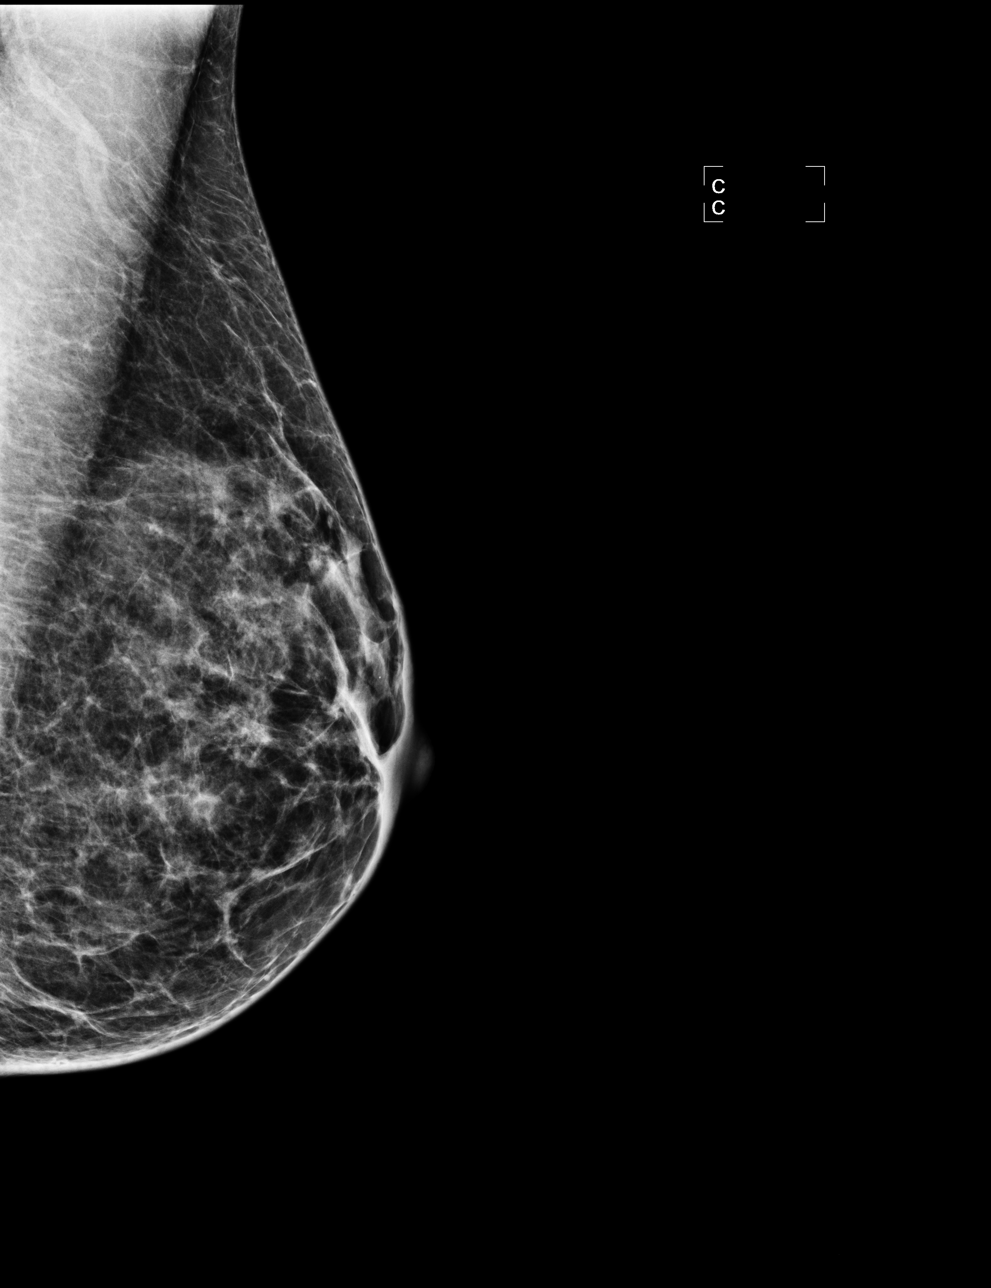

[R MLO]
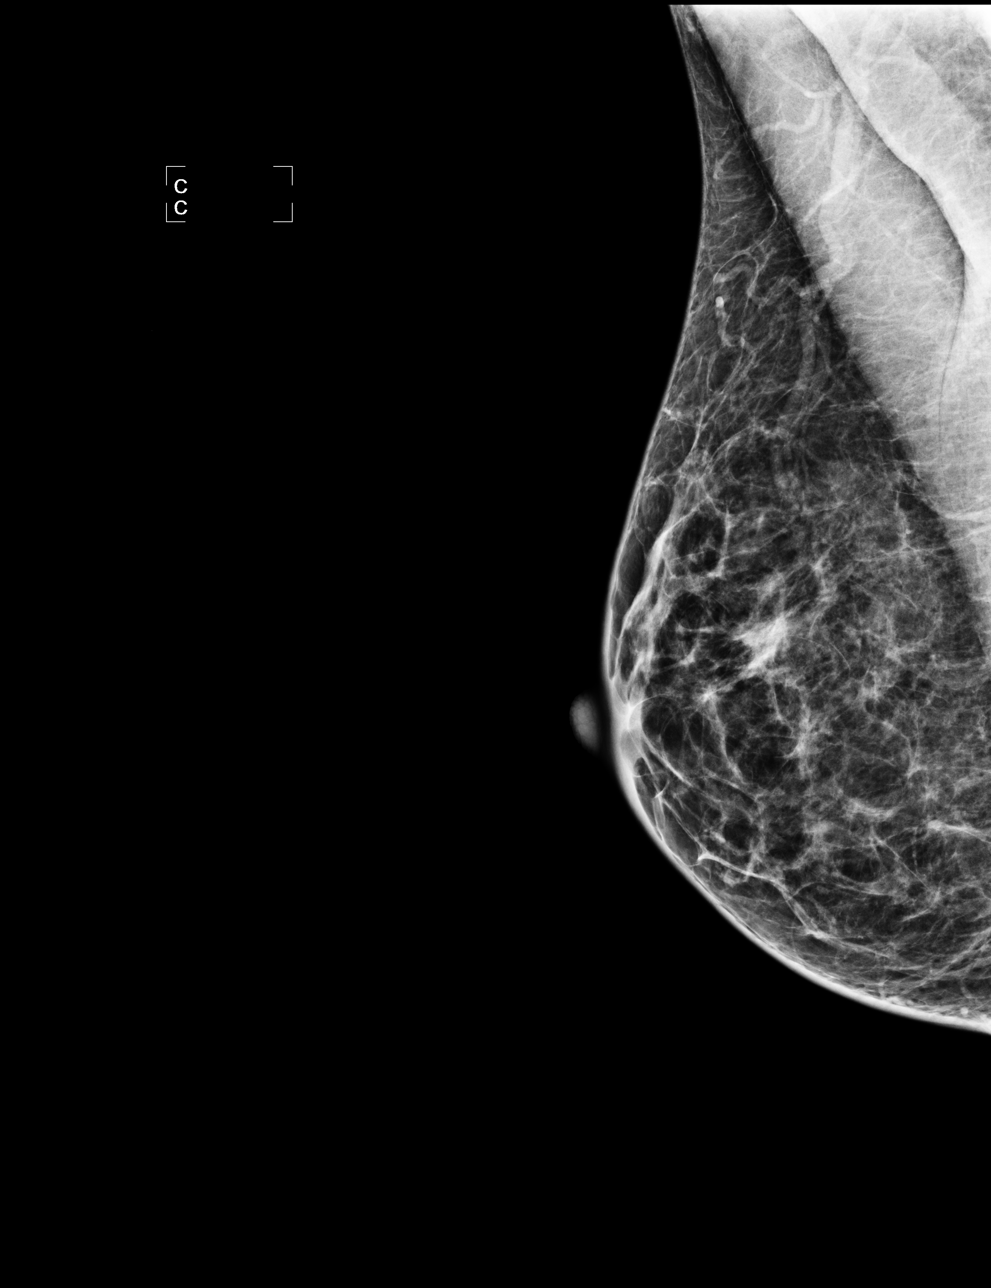

[4 of 4 positions shown; findings below may reference images not displayed]

IMPRESSION: No mammographic evidence of malignancy.  Suggest yearly screening mammography.

ASSESSMENT: Negative - BI-RADS 1

Screening mammogram in 1 year.
ANALYZED BY COMPUTER AIDED DETECTION. , THIS PROCEDURE WAS A DIGITAL MAMMOGRAM.

## 2007-09-01 ENCOUNTER — Ambulatory Visit (HOSPITAL_COMMUNITY): Admission: RE | Admit: 2007-09-01 | Discharge: 2007-09-01 | Payer: Self-pay | Admitting: Obstetrics and Gynecology

## 2007-09-01 IMAGING — CT CT ABDOMEN WO/W CM
1 of 4 series · 13 of 32 positions shown, 19 images · IV contrast (agent unspecified)
Comparison: Abdominal ultrasound [DATE].

CT ABDOMEN

CLINICAL DATA: 46-year-old with bilateral flank and right lower
quadrant pain and nausea for 2 days.

CT ABDOMEN WITHOUT AND WITH CONTRAST
TECHNIQUE: Multidetector CT imaging of the abdomen was performed
following the standard protocol before and following the bolus
administration of intravenous contrast. The pelvis was imaged only
following intravenous contrast administration.
Contrast: 100 ml [MK] intravenously.  Oral contrast was
given.

[Series 3: abd_pel 5.0 b40f st · axial · 0.56mm/px · z∈[-566,-206]mm · 13 of 85 slices shown, 19 images]
[im 7/85  soft-tissue]
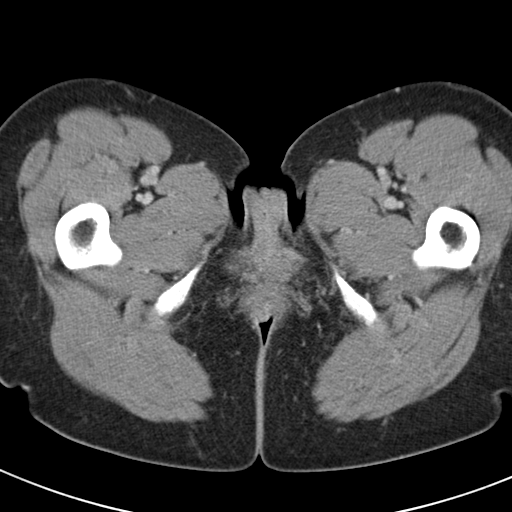
[im 7/85  bone]
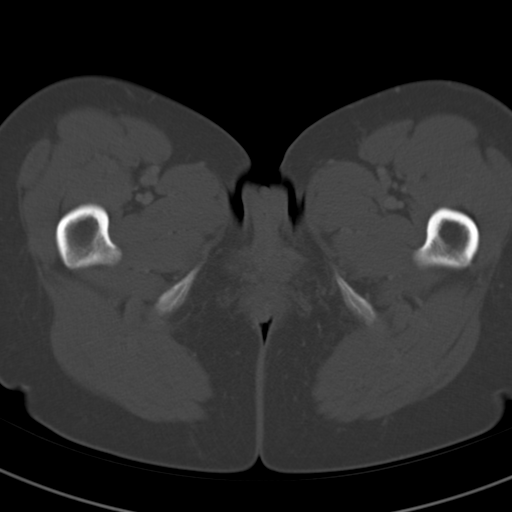
[im 13/85  soft-tissue]
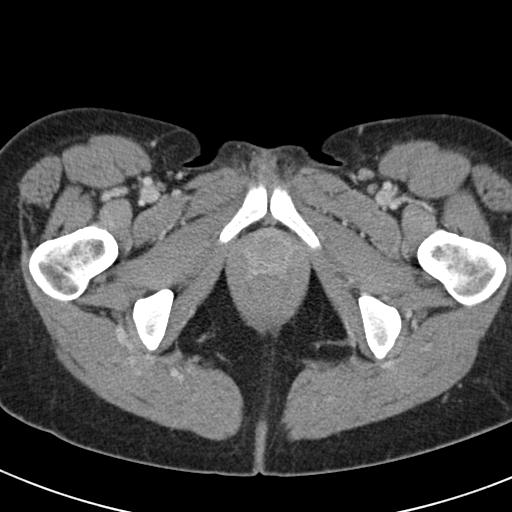
[im 19/85  soft-tissue]
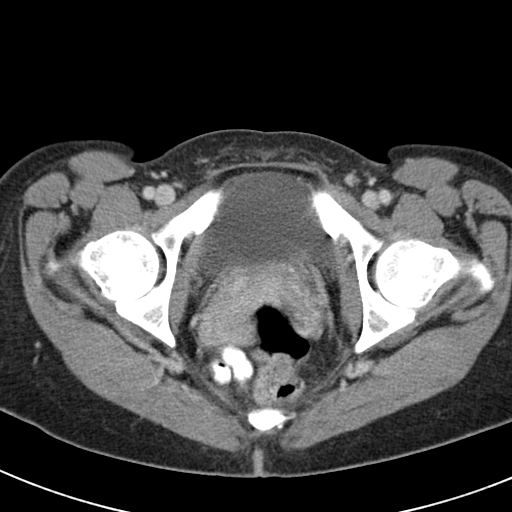
[im 25/85  soft-tissue]
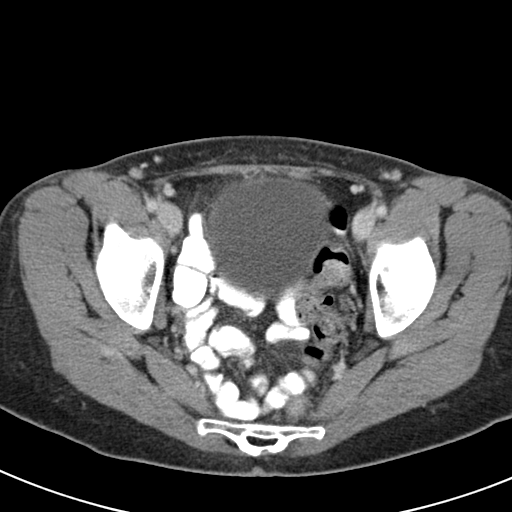
[im 31/85  soft-tissue]
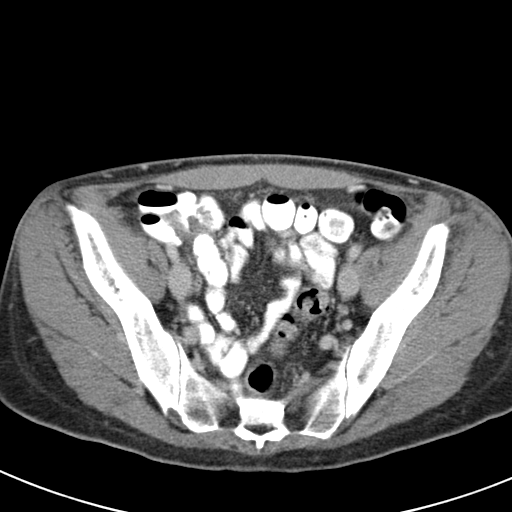
[im 37/85  soft-tissue]
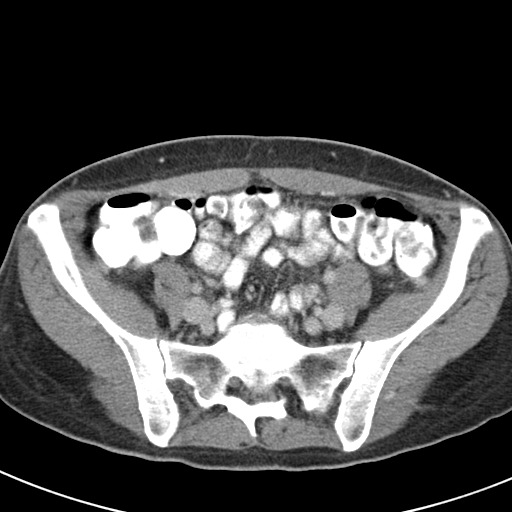
[im 43/85  soft-tissue]
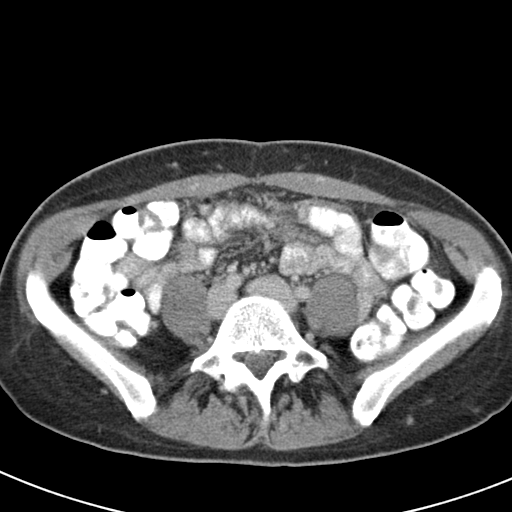
[im 49/85  soft-tissue]
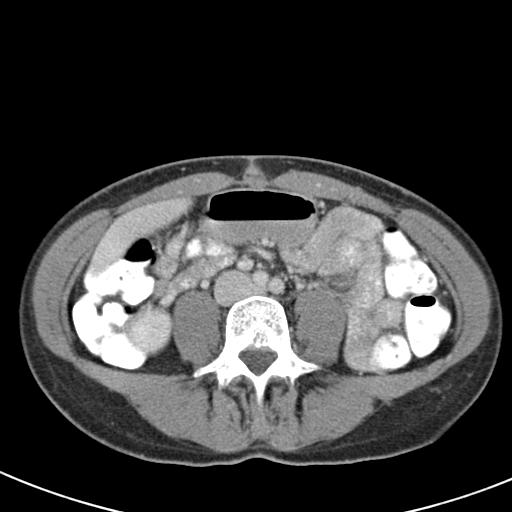
[im 55/85  soft-tissue]
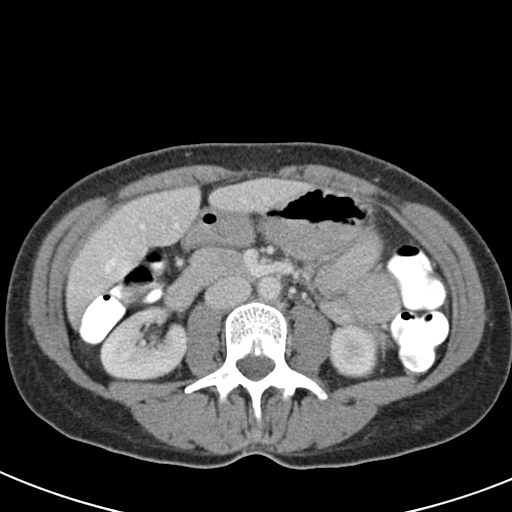
[im 55/85  bone]
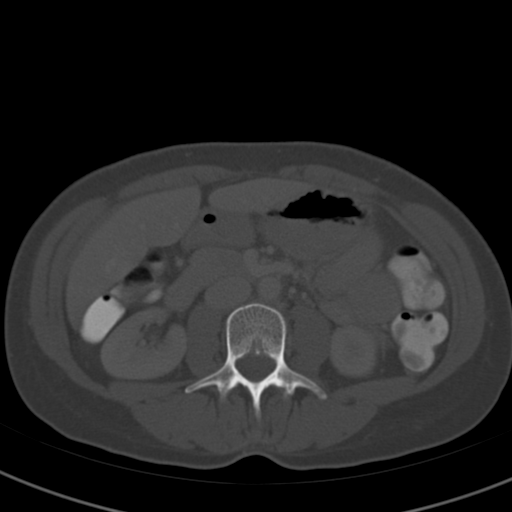
[im 61/85  soft-tissue]
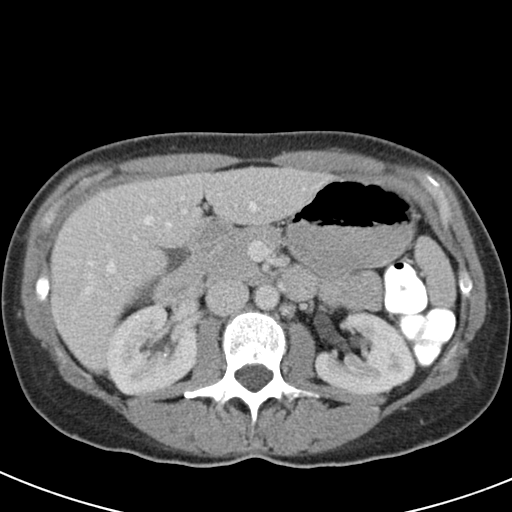
[im 61/85  lung]
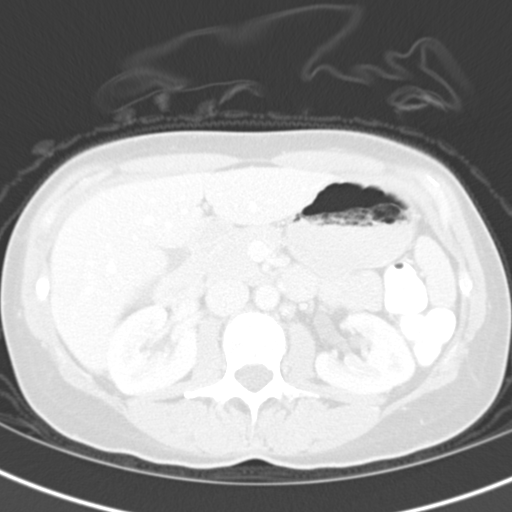
[im 67/85  soft-tissue]
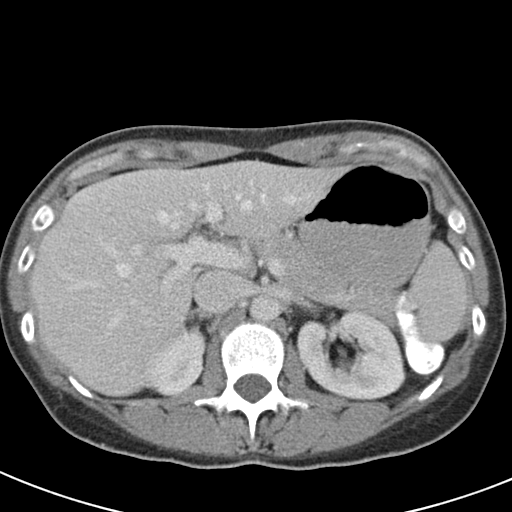
[im 67/85  lung]
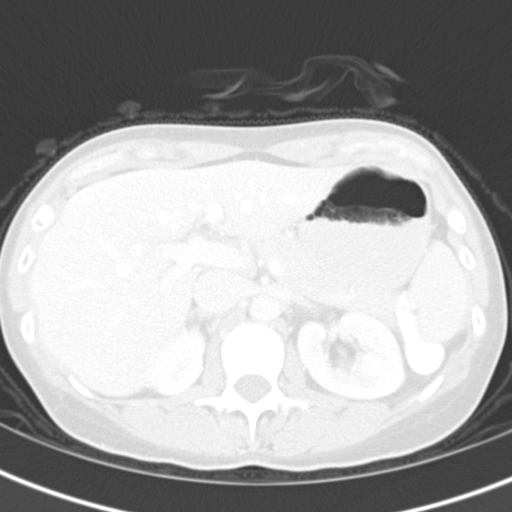
[im 73/85  soft-tissue]
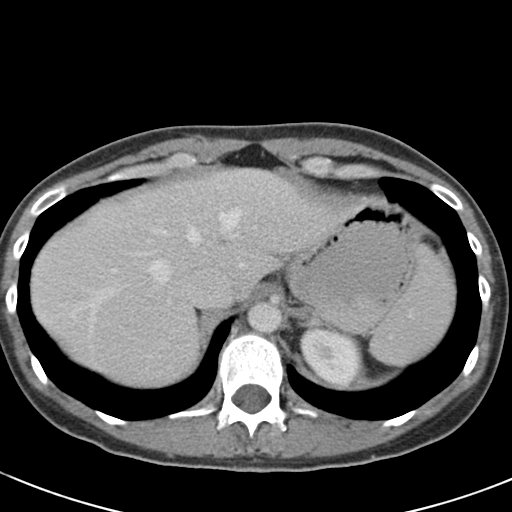
[im 73/85  lung]
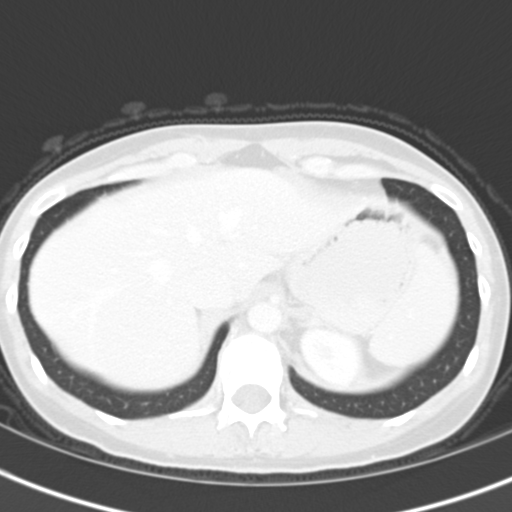
[im 79/85  soft-tissue]
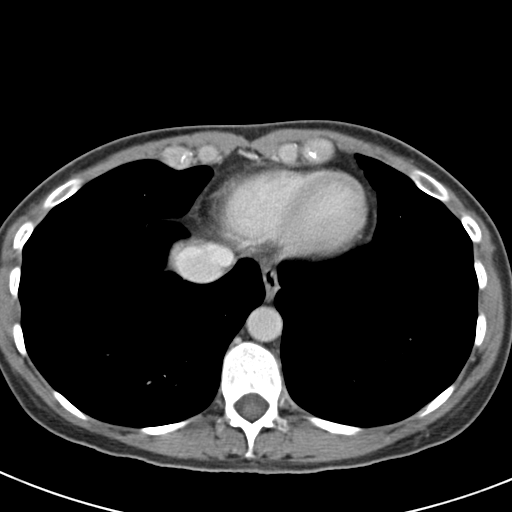
[im 79/85  lung]
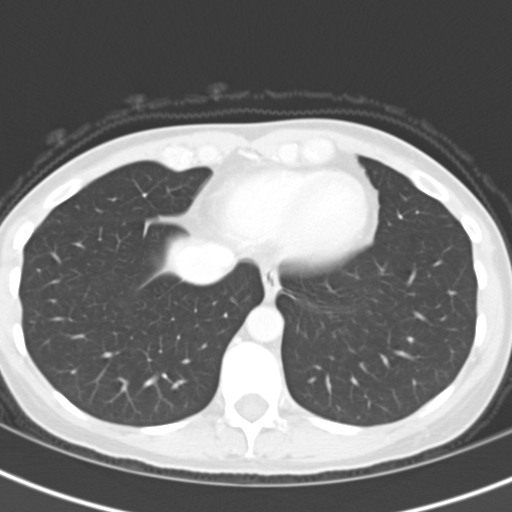

[13 of 32 positions shown; findings below may reference images not displayed]

FINDINGS: The lung bases are clear.  There is no pleural effusion.
The precontrast images through the kidneys demonstrate small
calyceal calculi bilaterally.  There is no hydronephrosis or
perinephric soft tissue stranding.  No ureteral calculi
demonstrated.  Post contrast, both kidneys enhance normally.  There
are no renal masses or delay in contrast excretion.

The liver, spleen, pancreas, adrenal glands and gallbladder appear
normal.  There are no enlarged lymph nodes.  No intra-abdominal
inflammatory changes are identified.
IMPRESSION: 1.  Multiple nonobstructing renal calyceal calculi bilaterally.  No
evidence of ureteral calculus or hydronephrosis.
2.  No definite acute abdominal findings.

CT PELVIS WITH CONTRAST
FINDINGS: The patient is status post hysterectomy.  No adnexal
mass is demonstrated.  A small calcification the left adnexa on
image 64 appears separate from the ureter and may reflect a
phlebolith.  The cecum is low lying, and the appendix is not well
visualized.  There is no evidence of pericecal inflammatory
process.
IMPRESSION: No acute pelvic findings status post hysterectomy.  No evidence of
ureterectasis or ureteral calculus.

## 2007-10-15 ENCOUNTER — Encounter: Admission: RE | Admit: 2007-10-15 | Discharge: 2007-10-15 | Payer: Self-pay | Admitting: Obstetrics and Gynecology

## 2008-10-16 ENCOUNTER — Encounter: Admission: RE | Admit: 2008-10-16 | Discharge: 2008-10-16 | Payer: Self-pay | Admitting: Obstetrics and Gynecology

## 2009-07-19 ENCOUNTER — Encounter: Admission: RE | Admit: 2009-07-19 | Discharge: 2009-07-19 | Payer: Self-pay | Admitting: Obstetrics and Gynecology

## 2009-10-17 ENCOUNTER — Encounter: Admission: RE | Admit: 2009-10-17 | Discharge: 2009-10-17 | Payer: Self-pay | Admitting: Obstetrics and Gynecology

## 2009-10-17 IMAGING — MG MM DIGITAL SCREENING
5 series · 5 of 5 positions shown · non-contrast
Comparison: none

DG SCREEN MAMMOGRAM BILATERAL
Bilateral CC and MLO view(s) were taken.

DIGITAL SCREENING MAMMOGRAM WITH CAD:
There are scattered fibroglandular densities.  No masses or malignant type calcifications are 
identified.  Compared with prior studies.
Images were processed with CAD.

[R CC (1 of 2)]
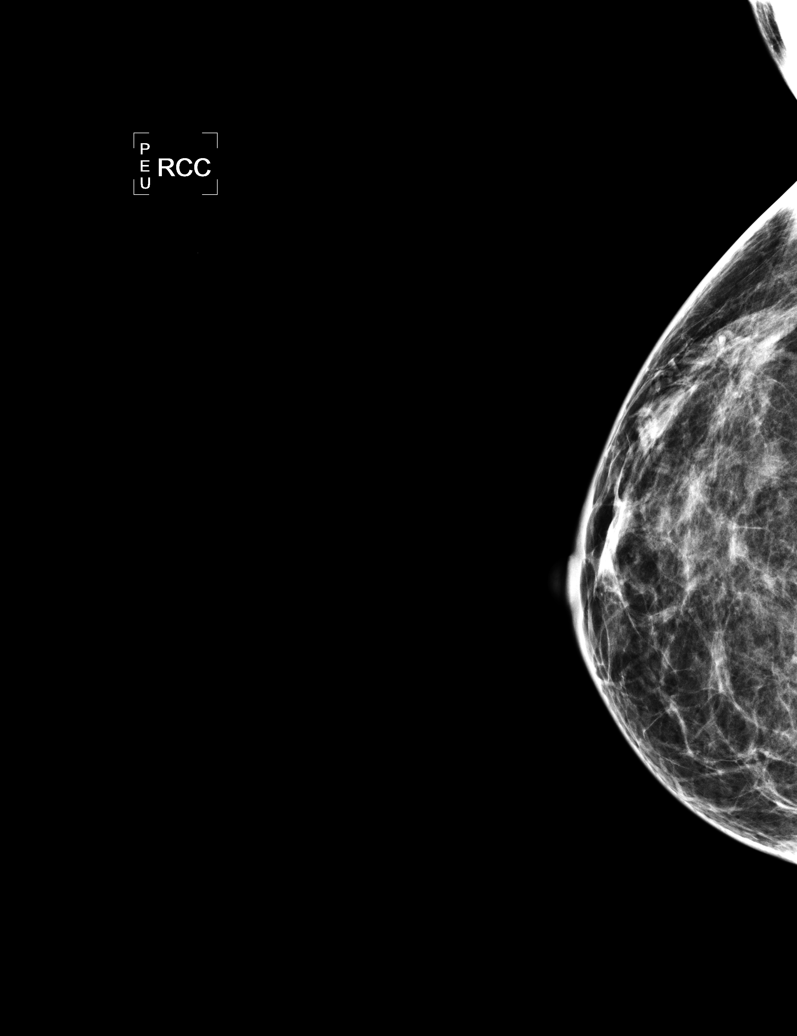

[L CC]
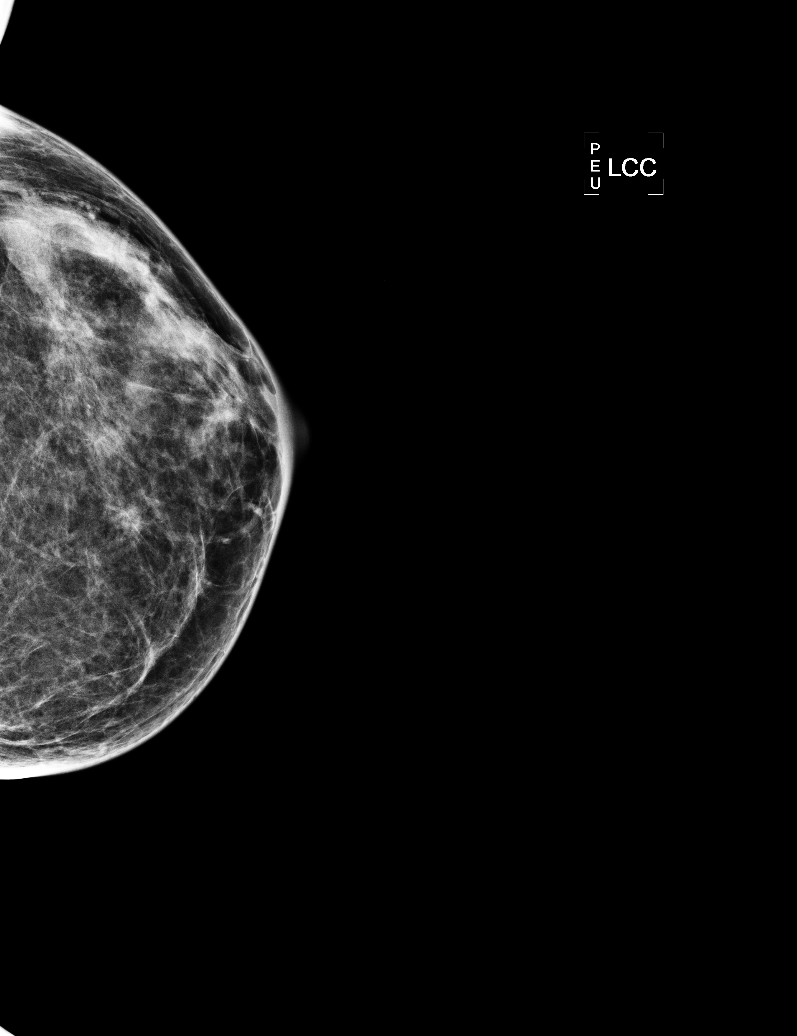

[L MLO]
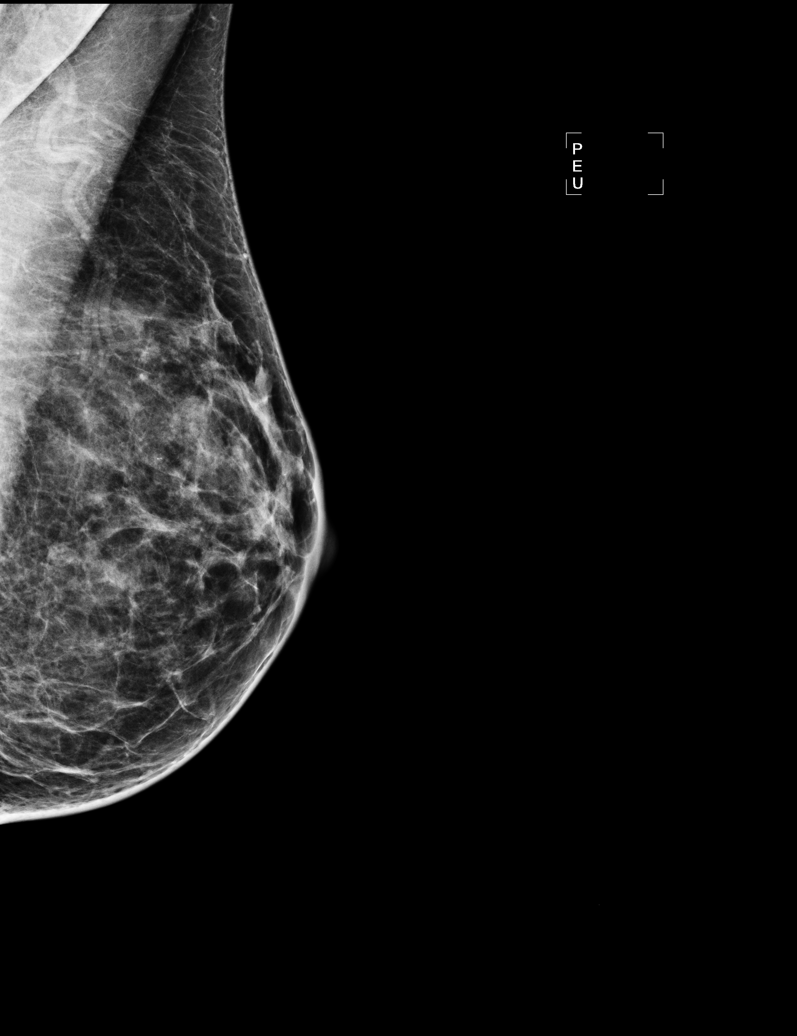

[R MLO]
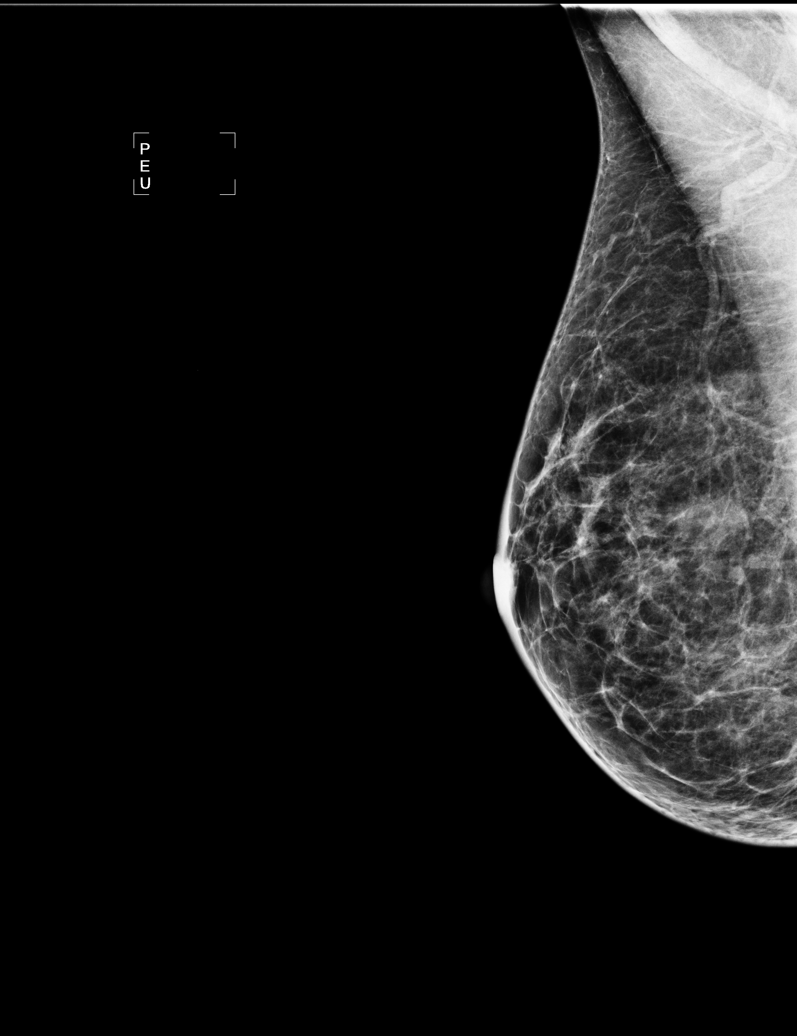

[R CC (2 of 2)]
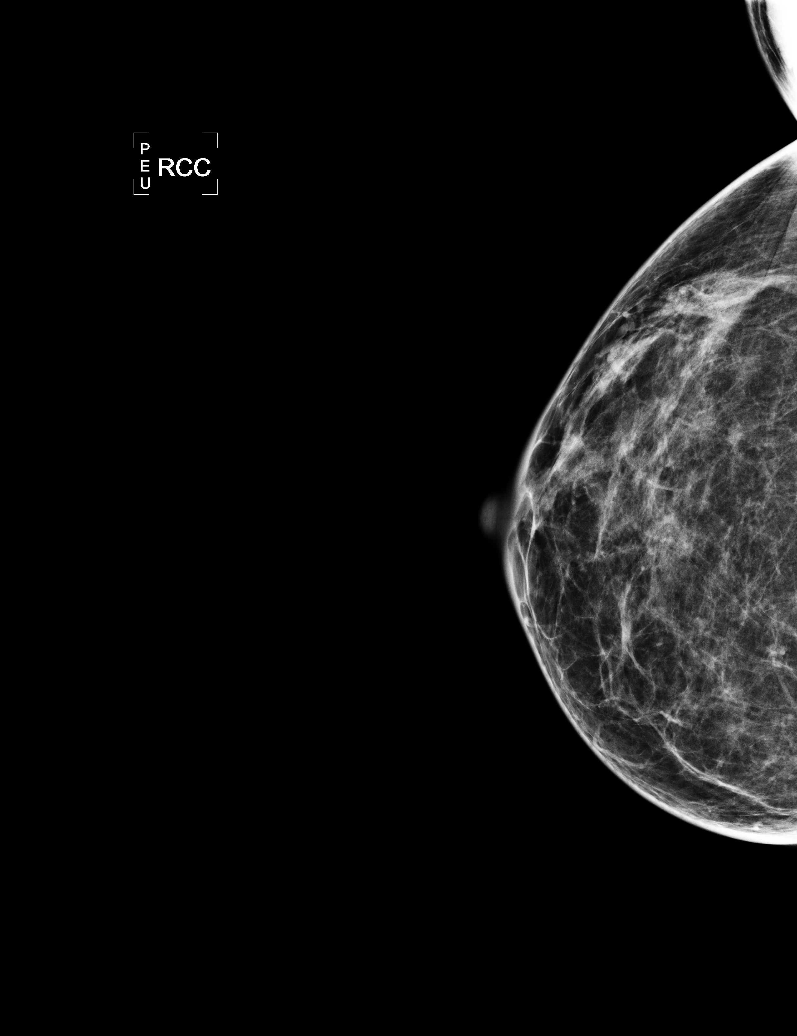

[5 of 5 positions shown; findings below may reference images not displayed]

IMPRESSION: No specific mammographic evidence of malignancy.  Next screening mammogram is recommended in one 
year.

A result letter of this screening mammogram will be mailed directly to the patient.

ASSESSMENT: Negative - BI-RADS 1

Screening mammogram in 1 year.
,

## 2010-01-09 ENCOUNTER — Emergency Department (HOSPITAL_COMMUNITY): Admission: EM | Admit: 2010-01-09 | Discharge: 2010-01-10 | Payer: Self-pay | Admitting: Emergency Medicine

## 2010-01-10 IMAGING — CT CT ABD-PELV W/ CM
2 of 6 series · 13 of 32 positions shown, 18 images · IV contrast (water/omni  & 80ml omni 300)
Comparison: CT of the abdomen and pelvis performed [DATE]

CLINICAL DATA: Left flank pain, radiating to the left side of the
abdomen.  Nausea, vomiting, diarrhea and dysuria.  History of renal
stones.

CT ABDOMEN AND PELVIS WITH CONTRAST
TECHNIQUE: Multidetector CT imaging of the abdomen and pelvis was
performed following the standard protocol during bolus
administration of intravenous contrast.
Contrast: 80 mL of Omnipaque 300 IV contrast

[Series 2: routine abdomen · axial · 0.77mm/px · z∈[-407,-137]mm · 5 of 82 slices shown, 10 images]
[im 14/82  soft-tissue]
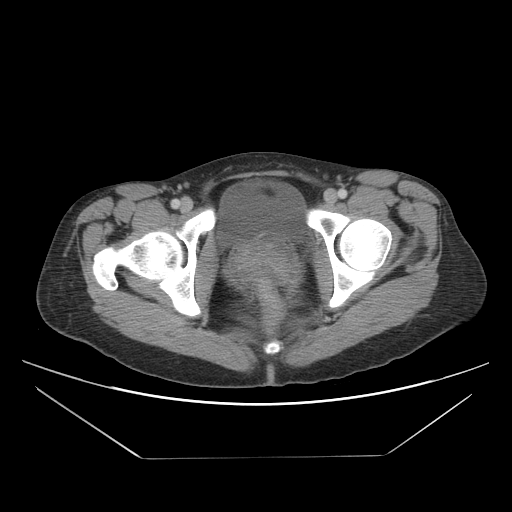
[im 14/82  bone]
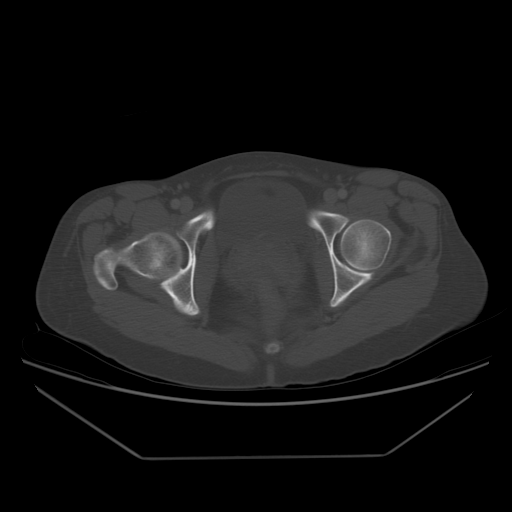
[im 28/82  soft-tissue]
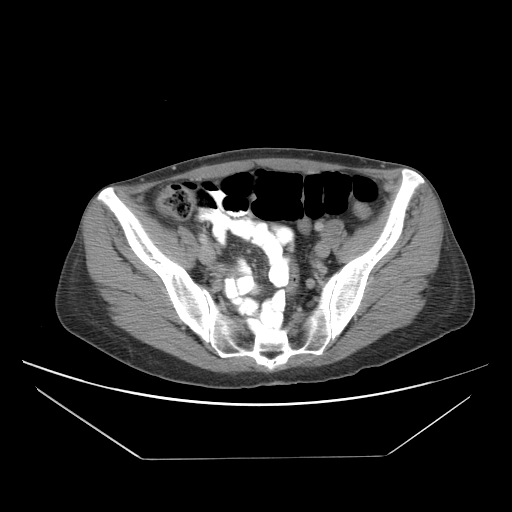
[im 28/82  lung]
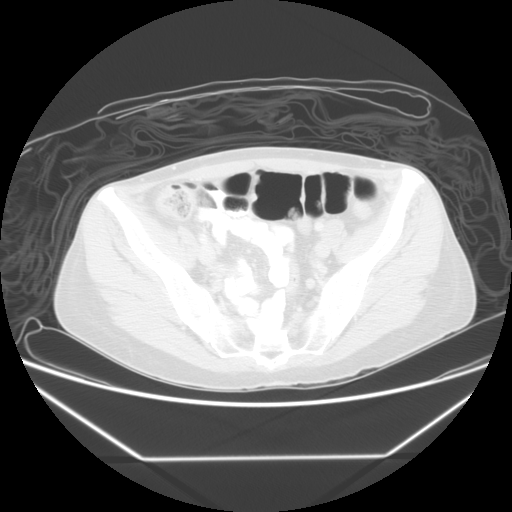
[im 41/82  soft-tissue]
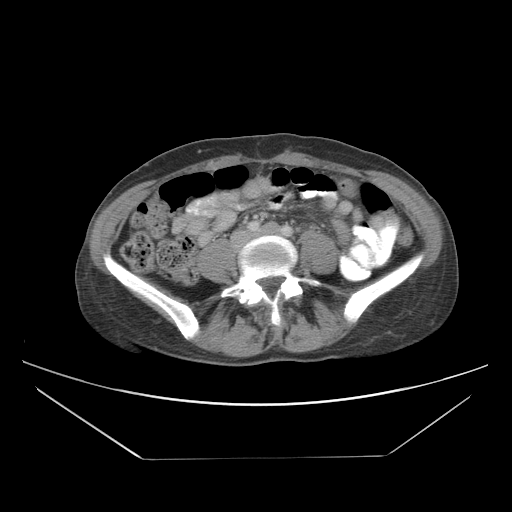
[im 41/82  lung]
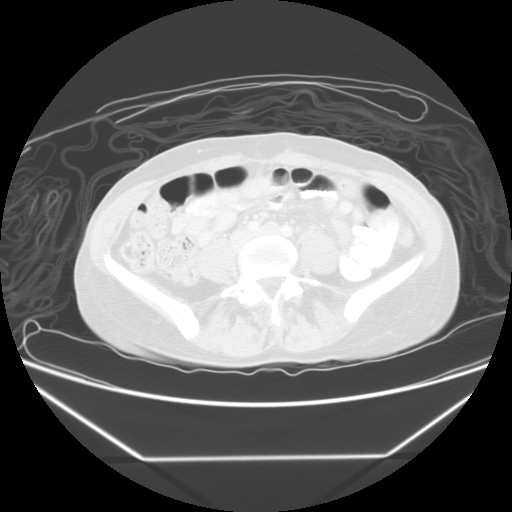
[im 55/82  soft-tissue]
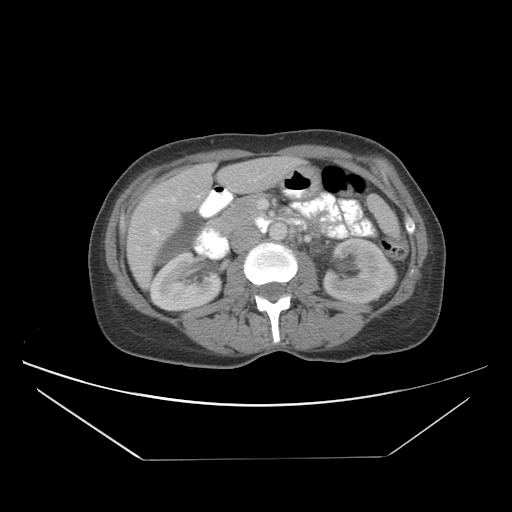
[im 55/82  lung]
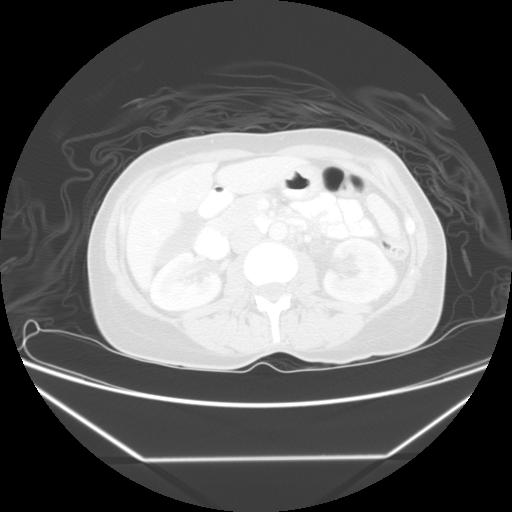
[im 68/82  soft-tissue]
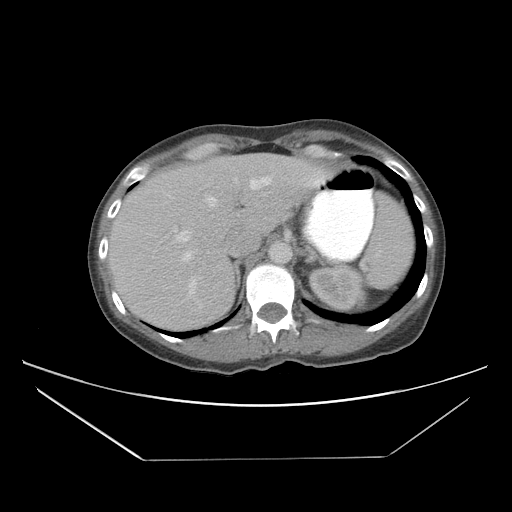
[im 68/82  lung]
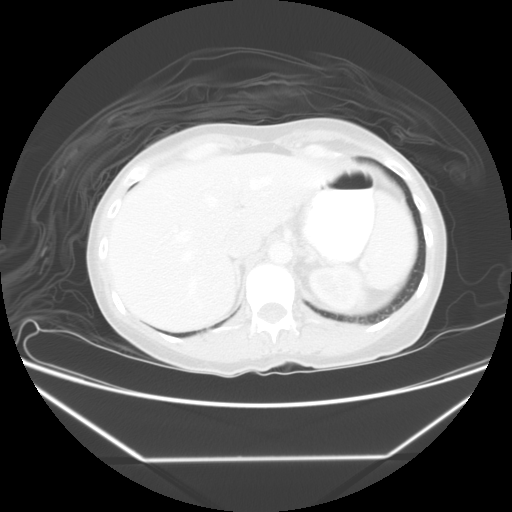

[Series 400: sag · sagittal · 0.81mm/px · 8 of 111 slices shown]
[im 13/111  soft-tissue]
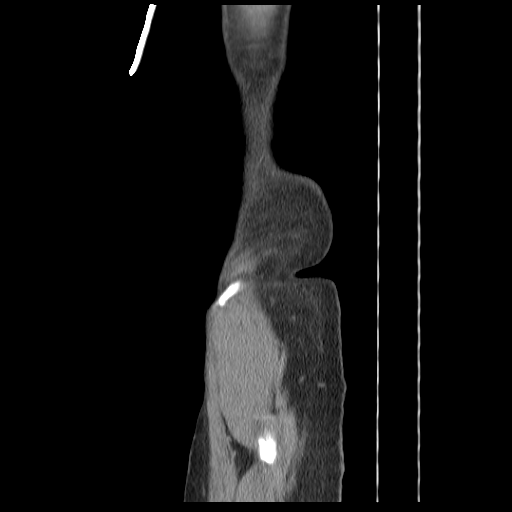
[im 25/111  soft-tissue]
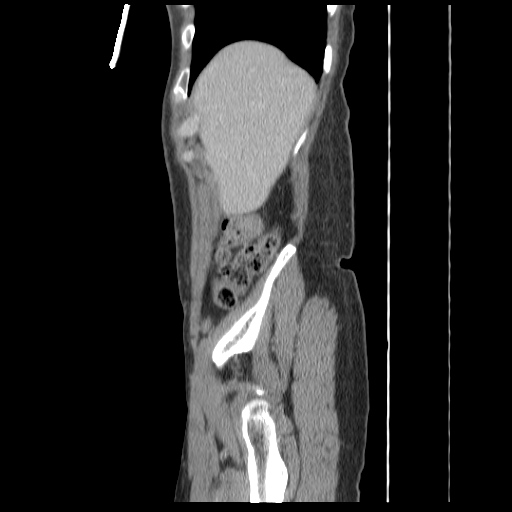
[im 37/111  soft-tissue]
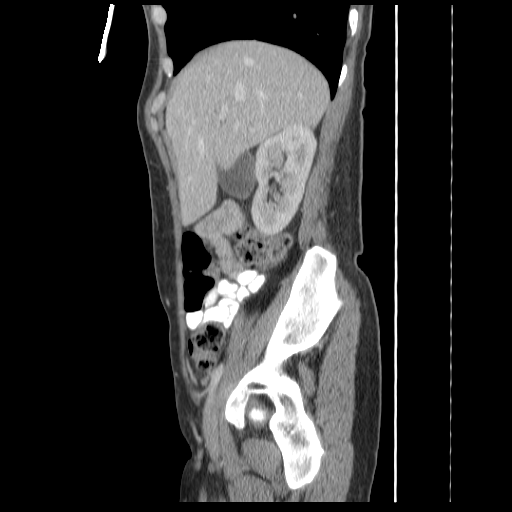
[im 49/111  soft-tissue]
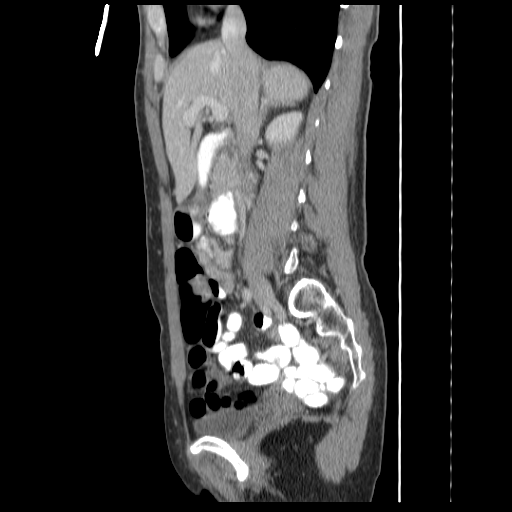
[im 62/111  soft-tissue]
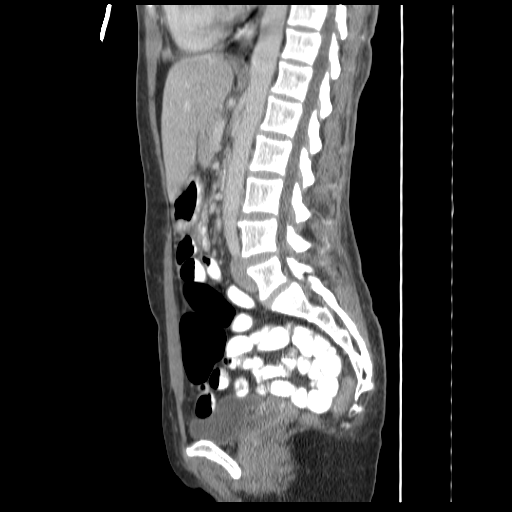
[im 74/111  soft-tissue]
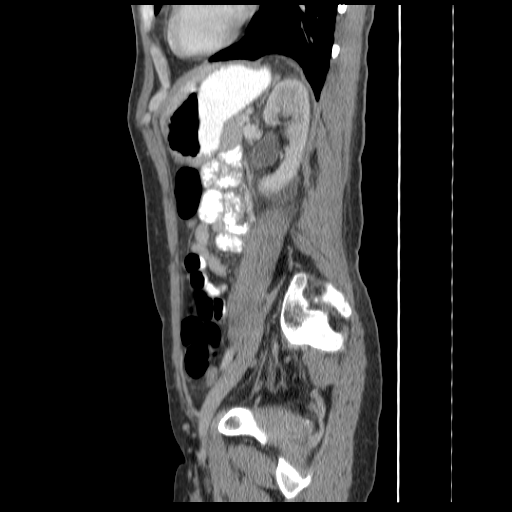
[im 86/111  soft-tissue]
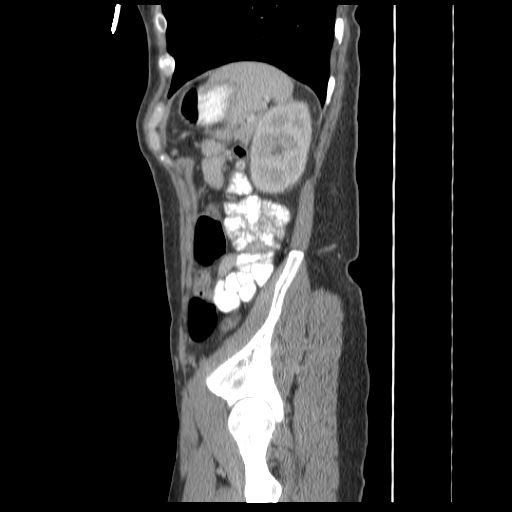
[im 98/111  soft-tissue]
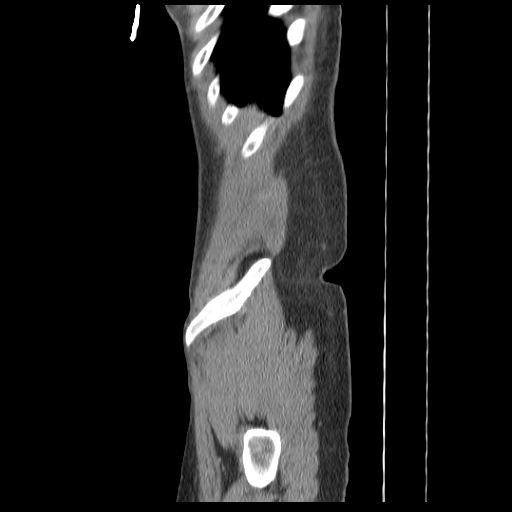

[13 of 32 positions shown; findings below may reference images not displayed]

FINDINGS: Mild bibasilar atelectasis is noted.

The liver and spleen are unremarkable in appearance.  The
gallbladder is within normal limits.  The pancreas and adrenal
glands are unremarkable.

There is mild to moderate left-sided hydronephrosis, with marked
prominence of the left ureter to the level of an obstructing 8 x 4
mm stone at the left vesicoureteral junction, along the base of the
bladder.  Associated left-sided perinephric stranding and fluid are
noted.  A tiny 2 mm stone is again noted at the upper pole of the
right kidney.  Previously noted nonobstructing renal stones are
less well characterized due to contrast enhancement.

No free fluid is identified.  The small bowel is unremarkable in
appearance.  The stomach is within normal limits.  No acute
vascular abnormalities are seen.

The appendix is normal in caliber, without evidence for
appendicitis.  The colon is grossly unremarkable in appearance.
The transverse and sigmoid colon are mildly redundant.

The bladder is mildly distended and otherwise unremarkable in
appearance.  The patient is status post hysterectomy; the adnexa
are grossly unremarkable in appearance.  No suspicious adnexal
masses are seen.  No inguinal lymphadenopathy is seen.

No acute osseous abnormalities are identified.
IMPRESSION: 1.  Mild to moderate left-sided hydronephrosis, with marked
prominence of the distal left ureter, and an obstructing 8 x 4 mm
stone at the left vesicoureteral junction.  Associated left-sided
perinephric stranding and fluid noted.
2.  Tiny 2 mm nonobstructing stone at the upper pole of the right
kidney.
3.  Mild bibasilar atelectasis noted.

## 2010-01-11 ENCOUNTER — Ambulatory Visit (HOSPITAL_COMMUNITY): Admission: RE | Admit: 2010-01-11 | Discharge: 2010-01-11 | Payer: Self-pay | Admitting: Urology

## 2010-01-11 IMAGING — CR DG ABDOMEN 1V
1 series · 1 of 1 positions shown · non-contrast
Comparison: [DATE].

CLINICAL DATA: Distal ureteral stone.  Left-sided pain.

ABDOMEN - 1 VIEW

[t abdomen supine]
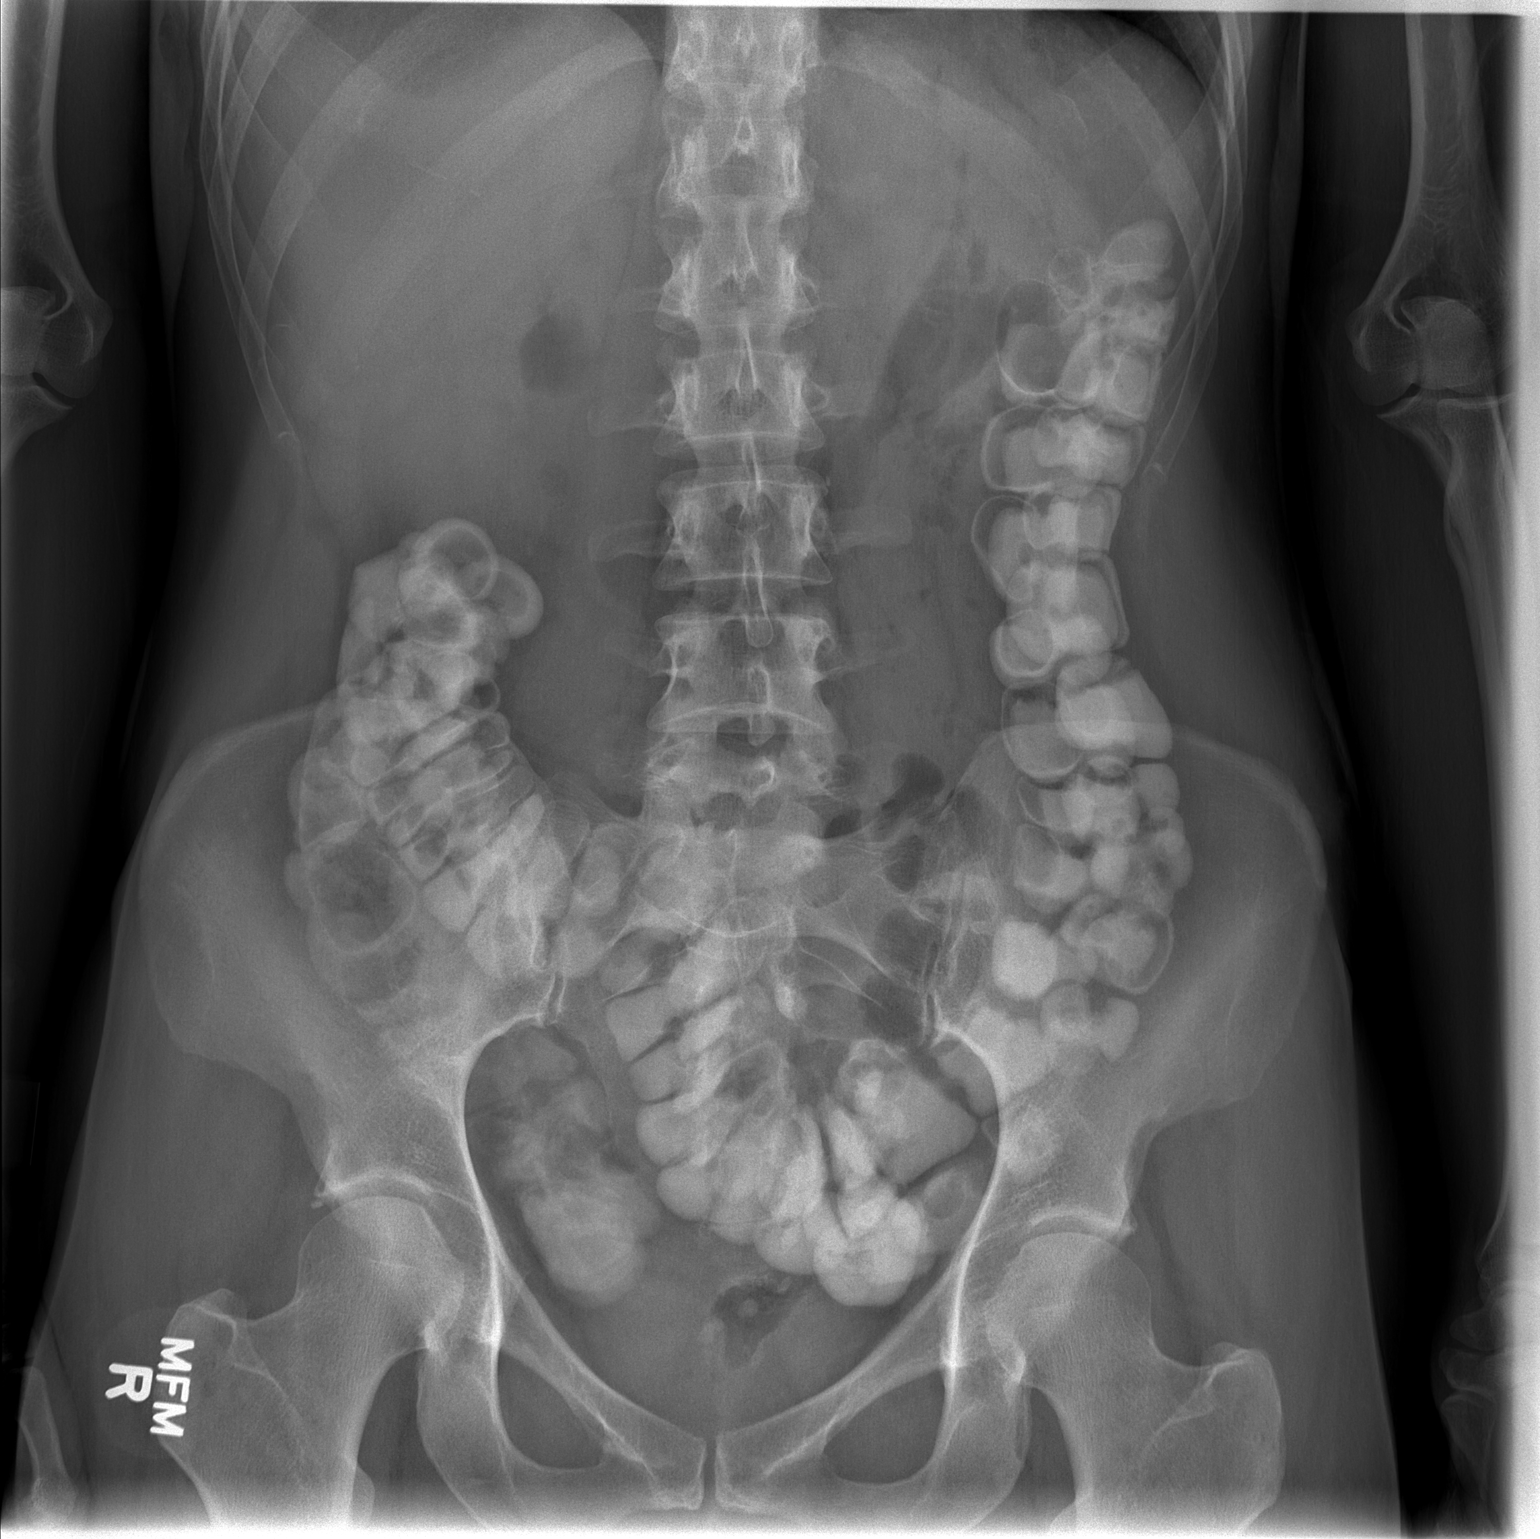

[1 of 1 positions shown; findings below may reference images not displayed]

FINDINGS: Residual contrast throughout the colon from recent CT
limits evaluation.

Within the pelvis, there are two radiopaque structures.  Centrally
6.8 x 5.1 mm corresponds to a CT detected obstructing stone.  Just
to the left and superior is 2.7 x 5.3 mm calcification which
corresponds to a phlebolith on the CT scan.
IMPRESSION: Within the pelvis, there are two radiopaque structures.  Centrally
6.8 x 5.1 mm corresponds to a CT detected obstructing stone.  Just
to the left and superior is 2.7 x 5.3 mm calcification which
corresponds to a phlebolith on the CT scan.

## 2010-06-16 ENCOUNTER — Encounter: Payer: Self-pay | Admitting: Obstetrics and Gynecology

## 2010-08-09 LAB — CBC
HCT: 38.3 % (ref 36.0–46.0)
Hemoglobin: 12.6 g/dL (ref 12.0–15.0)
MCH: 31 pg (ref 26.0–34.0)
MCHC: 32.9 g/dL (ref 30.0–36.0)
MCV: 94.1 fL (ref 78.0–100.0)
Platelets: 149 10*3/uL — ABNORMAL LOW (ref 150–400)
RBC: 4.07 MIL/uL (ref 3.87–5.11)
RDW: 12.3 % (ref 11.5–15.5)
WBC: 7.7 10*3/uL (ref 4.0–10.5)

## 2010-08-09 LAB — URINALYSIS, ROUTINE W REFLEX MICROSCOPIC
Bilirubin Urine: NEGATIVE
Glucose, UA: NEGATIVE mg/dL
Hgb urine dipstick: NEGATIVE
Ketones, ur: 40 mg/dL — AB
Nitrite: NEGATIVE
Protein, ur: NEGATIVE mg/dL
Specific Gravity, Urine: 1.015 (ref 1.005–1.030)
Urobilinogen, UA: 0.2 mg/dL (ref 0.0–1.0)
pH: 6.5 (ref 5.0–8.0)

## 2010-08-09 LAB — COMPREHENSIVE METABOLIC PANEL
ALT: 21 U/L (ref 0–35)
AST: 25 U/L (ref 0–37)
Albumin: 3.6 g/dL (ref 3.5–5.2)
Alkaline Phosphatase: 50 U/L (ref 39–117)
BUN: 12 mg/dL (ref 6–23)
CO2: 23 mEq/L (ref 19–32)
Calcium: 8.8 mg/dL (ref 8.4–10.5)
Chloride: 112 mEq/L (ref 96–112)
Creatinine, Ser: 1.36 mg/dL — ABNORMAL HIGH (ref 0.4–1.2)
GFR calc Af Amer: 51 mL/min — ABNORMAL LOW (ref >60)
GFR calc non Af Amer: 42 mL/min — ABNORMAL LOW (ref >60)
Glucose, Bld: 100 mg/dL — ABNORMAL HIGH (ref 70–99)
Potassium: 3.5 mEq/L (ref 3.5–5.1)
Sodium: 140 mEq/L (ref 135–145)
Total Bilirubin: 0.6 mg/dL (ref 0.3–1.2)
Total Protein: 6 g/dL (ref 6.0–8.3)

## 2010-08-09 LAB — POCT PREGNANCY, URINE: Preg Test, Ur: NEGATIVE

## 2010-08-09 LAB — DIFFERENTIAL
Basophils Absolute: 0 10*3/uL (ref 0.0–0.1)
Basophils Relative: 0 % (ref 0–1)
Eosinophils Absolute: 0 10*3/uL (ref 0.0–0.7)
Eosinophils Relative: 0 % (ref 0–5)
Lymphocytes Relative: 11 % — ABNORMAL LOW (ref 12–46)
Lymphs Abs: 0.9 10*3/uL (ref 0.7–4.0)
Monocytes Absolute: 0.6 10*3/uL (ref 0.1–1.0)
Monocytes Relative: 8 % (ref 3–12)
Neutro Abs: 6.2 10*3/uL (ref 1.7–7.7)
Neutrophils Relative %: 80 % — ABNORMAL HIGH (ref 43–77)

## 2010-08-09 LAB — SURGICAL PCR SCREEN
MRSA, PCR: NEGATIVE
Staphylococcus aureus: NEGATIVE

## 2010-08-09 LAB — LIPASE, BLOOD: Lipase: 40 U/L (ref 11–59)

## 2010-10-07 ENCOUNTER — Other Ambulatory Visit: Payer: Self-pay | Admitting: Obstetrics

## 2010-10-07 DIAGNOSIS — E8940 Asymptomatic postprocedural ovarian failure: Secondary | ICD-10-CM

## 2010-10-11 NOTE — Op Note (Signed)
Cindy Fitzgerald, Cindy Fitzgerald                           ACCOUNT NO.:  [id number]   MEDICAL RECORD NO.:  [id number]                   PATIENT TYPE:  AMB   LOCATION:  [facility]                                  FACILITY:  [facility]   PHYSICIAN:  Robert A. Thurston Hole, M.D.              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   POSTOPERATIVE DIAGNOSIS:  Left shoulder adhesive capsulitis with rotator  cuff tendinitis and impingement.   PROCEDURES:  1. Left shoulder examination under anesthesia, followed by manipulation.  2. Left shoulder arthroscopy with subacromial decompression.   SURGEON:  Elana Alm. Thurston Hole, M.D.   ASSISTANT:  Julien Girt, P.A.   ANESTHESIA:  General.   OPERATIVE TIME:  40 minutes.   COMPLICATIONS:  None.   INDICATION FOR PROCEDURE:  Cindy Fitzgerald is a 50 year old woman who has had  over a year of left shoulder pain with exam and MRI documenting rotator cuff  tendinitis and impingement with adhesive capsulitis.  She has failed  conservative care and is now to undergo arthroscopy.   DESCRIPTION:  Cindy Fitzgerald was brought to the operating room on October 31, 2003,  after an interscalene block had been placed in the holding room by  anesthesia for postoperative pain control.  She was placed on the operating  table in supine position.  After being placed under general anesthesia, her  left shoulder was examined under anesthesia.  She had initial range of  motion with forward flexion to 150, abduction to 160, internal and external  rotation of 80 degrees.  A gentle manipulation was carried out, improving  range of motion, forward flexion to 180, abduction of 180, internal and  external rotation of 90 degrees.  The shoulder remained stable to  ligamentous exam.  She was then placed in a beach  position and her shoulder  and arm were prepped using sterile Duraprep and draped using sterile  technique.  Originally through a posterior arthroscopic portal the  arthroscope with a pump attached was placed and through an anterior portal,  an arthroscopic probe was placed.  On initial inspection, the articular  cartilage in the glenohumeral joint was intact.  The anterior and posterior  labrum intact.  Superior labrum and biceps tendon anchor intact.  Biceps  tendon intact.  Inferior labrum and anterior inferior glenohumeral ligament  complex was intact.  Rotator cuff was thoroughly inspected along the  articular surface, and it was found to be intact.  The inferior capsule  recess was free of pathology.  The subacromial space was entered and a  lateral arthroscopic portal was made.  Moderately thickened bursitis was  resected.  Underneath this the rotator cuff was somewhat inflamed but no  tear was noted.  Impingement was  noted.  Subacromial decompression was  carried out as well as a CA ligament release.  The [facility] joint was not  disturbed.  The shoulder could be brought through a full range of motion  with no impingement on the rotator cuff after this was done.  At this point  it was felt that all pathology had been satisfactorily addressed.  The  instruments removed.  Portals closed with 3-0 nylon sutures, sterile  dressings and a sling applied, and the patient awakened and taken to the  recovery room in stable condition.   FOLLOW-UP CARE:  Cindy Fitzgerald will be followed as an outpatient on Percocet  and Naprosyn with early aggressive physical therapy.  See her back in the  office in a week for sutures out and follow-up.                                               Robert A. Thurston Hole, M.D.    RAW/MEDQ  D:  10/31/2003  T:  11/01/2003  Job:  [id number]

## 2010-10-29 ENCOUNTER — Other Ambulatory Visit: Payer: Self-pay | Admitting: Obstetrics

## 2010-10-29 DIAGNOSIS — Z1231 Encounter for screening mammogram for malignant neoplasm of breast: Secondary | ICD-10-CM

## 2010-12-05 ENCOUNTER — Ambulatory Visit
Admission: RE | Admit: 2010-12-05 | Discharge: 2010-12-05 | Disposition: A | Discharge status: Home / Self Care | Payer: BC Managed Care – PPO | Source: Ambulatory Visit | Attending: Obstetrics | Admitting: Obstetrics

## 2010-12-05 ENCOUNTER — Other Ambulatory Visit: Payer: Self-pay | Admitting: Diagnostic Radiology

## 2010-12-05 DIAGNOSIS — E8940 Asymptomatic postprocedural ovarian failure: Secondary | ICD-10-CM

## 2010-12-05 DIAGNOSIS — Z1231 Encounter for screening mammogram for malignant neoplasm of breast: Secondary | ICD-10-CM

## 2010-12-09 ENCOUNTER — Ambulatory Visit (HOSPITAL_COMMUNITY)
Admission: RE | Admit: 2010-12-09 | Discharge: 2010-12-09 | Disposition: A | Discharge status: Home / Self Care | Payer: BC Managed Care – PPO | Source: Ambulatory Visit | Attending: Urology | Admitting: Urology

## 2010-12-09 DIAGNOSIS — Z7982 Long term (current) use of aspirin: Secondary | ICD-10-CM | POA: Insufficient documentation

## 2010-12-09 DIAGNOSIS — G4730 Sleep apnea, unspecified: Secondary | ICD-10-CM | POA: Insufficient documentation

## 2010-12-09 DIAGNOSIS — Z90710 Acquired absence of both cervix and uterus: Secondary | ICD-10-CM | POA: Insufficient documentation

## 2010-12-09 DIAGNOSIS — I498 Other specified cardiac arrhythmias: Secondary | ICD-10-CM | POA: Insufficient documentation

## 2010-12-09 DIAGNOSIS — Z79899 Other long term (current) drug therapy: Secondary | ICD-10-CM | POA: Insufficient documentation

## 2010-12-09 DIAGNOSIS — N200 Calculus of kidney: Secondary | ICD-10-CM | POA: Insufficient documentation

## 2010-12-09 HISTORY — PX: EXTRACORPOREAL SHOCK WAVE LITHOTRIPSY: SHX1557

## 2010-12-09 IMAGING — CR DG ABDOMEN 1V
1 series · 1 of 1 positions shown · non-contrast
Comparison: [DATE]

CLINICAL DATA: The pre lithotripsy.

ABDOMEN - 1 VIEW

[t abdomen supine]
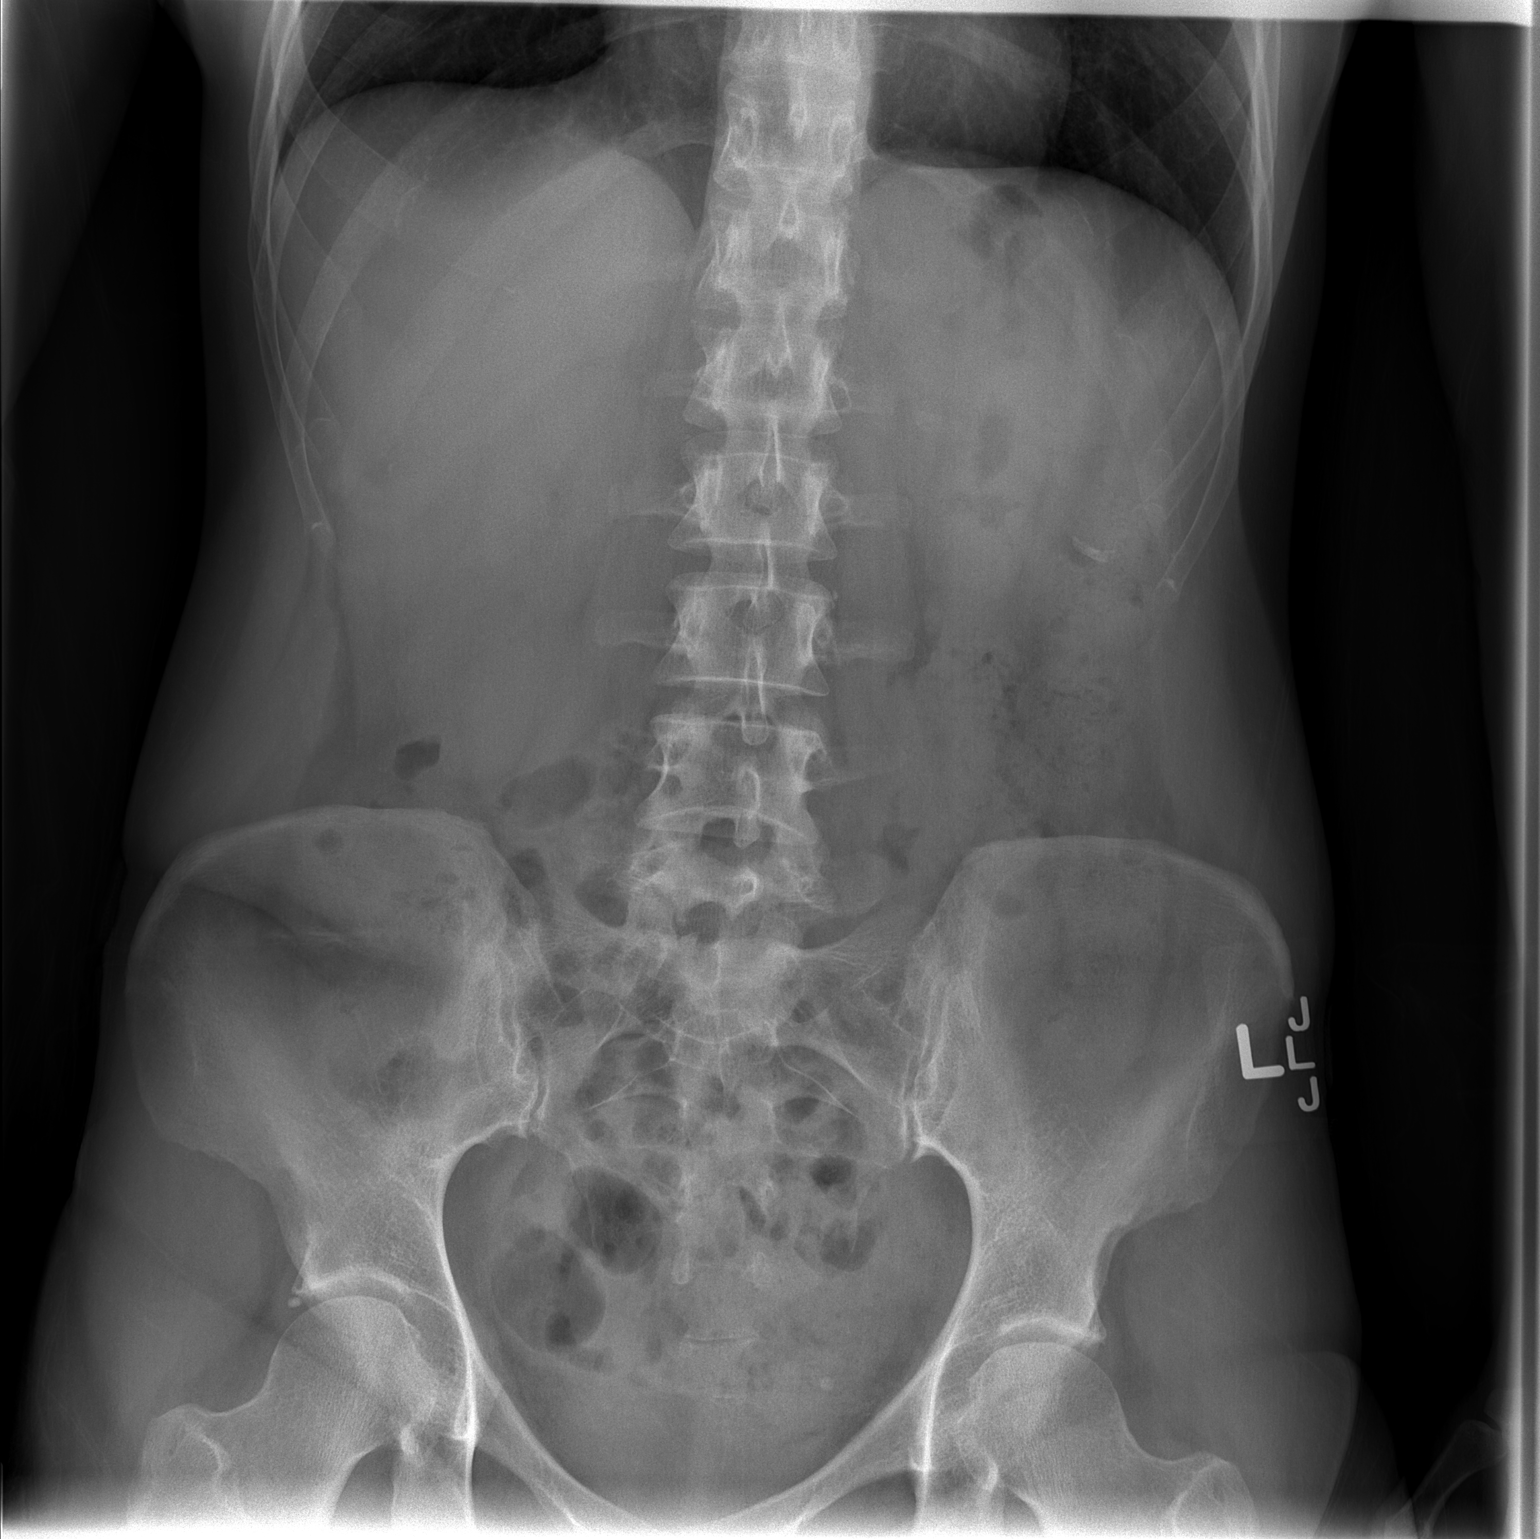

[1 of 1 positions shown; findings below may reference images not displayed]

FINDINGS: There is a nonobstructive bowel gas pattern.  Rounded
calcification noted in the left anatomic pelvis, stable since prior
study.  This was present dating back to [DATE], compatible with
a phlebolith.  No visible suspicious calcifications.  No
organomegaly.  Lung bases are clear.  No bony abnormality.
IMPRESSION: No acute findings.

## 2011-03-31 ENCOUNTER — Emergency Department (HOSPITAL_BASED_OUTPATIENT_CLINIC_OR_DEPARTMENT_OTHER)
Admission: EM | Admit: 2011-03-31 | Discharge: 2011-03-31 | Disposition: A | Discharge status: Home / Self Care | Payer: BC Managed Care – PPO | Attending: Emergency Medicine | Admitting: Emergency Medicine

## 2011-03-31 ENCOUNTER — Other Ambulatory Visit: Payer: Self-pay

## 2011-03-31 ENCOUNTER — Encounter (HOSPITAL_BASED_OUTPATIENT_CLINIC_OR_DEPARTMENT_OTHER): Payer: Self-pay | Admitting: *Deleted

## 2011-03-31 DIAGNOSIS — I951 Orthostatic hypotension: Secondary | ICD-10-CM | POA: Insufficient documentation

## 2011-03-31 DIAGNOSIS — R112 Nausea with vomiting, unspecified: Secondary | ICD-10-CM | POA: Insufficient documentation

## 2011-03-31 DIAGNOSIS — R197 Diarrhea, unspecified: Secondary | ICD-10-CM

## 2011-03-31 HISTORY — DX: Migraine, unspecified, not intractable, without status migrainosus: G43.909

## 2011-03-31 HISTORY — DX: Systemic lupus erythematosus, unspecified: M32.9

## 2011-03-31 LAB — URINALYSIS, ROUTINE W REFLEX MICROSCOPIC
Glucose, UA: NEGATIVE mg/dL
Hgb urine dipstick: NEGATIVE
Ketones, ur: >80 mg/dL — AB
Leukocytes, UA: NEGATIVE
Nitrite: NEGATIVE
Protein, ur: NEGATIVE mg/dL
Specific Gravity, Urine: 1.026 (ref 1.005–1.030)
Urobilinogen, UA: 1 mg/dL (ref 0.0–1.0)
pH: 6 (ref 5.0–8.0)

## 2011-03-31 LAB — CBC
HCT: 39.3 % (ref 36.0–46.0)
Hemoglobin: 13.3 g/dL (ref 12.0–15.0)
MCH: 30.9 pg (ref 26.0–34.0)
MCHC: 33.8 g/dL (ref 30.0–36.0)
MCV: 91.4 fL (ref 78.0–100.0)
Platelets: 148 10*3/uL — ABNORMAL LOW (ref 150–400)
RBC: 4.3 MIL/uL (ref 3.87–5.11)
RDW: 11.3 % — ABNORMAL LOW (ref 11.5–15.5)
WBC: 2.4 10*3/uL — ABNORMAL LOW (ref 4.0–10.5)

## 2011-03-31 LAB — DIFFERENTIAL
Basophils Absolute: 0 10*3/uL (ref 0.0–0.1)
Basophils Relative: 0 % (ref 0–1)
Eosinophils Absolute: 0 10*3/uL (ref 0.0–0.7)
Eosinophils Relative: 2 % (ref 0–5)
Lymphocytes Relative: 49 % — ABNORMAL HIGH (ref 12–46)
Lymphs Abs: 1.2 10*3/uL (ref 0.7–4.0)
Monocytes Absolute: 0.1 10*3/uL (ref 0.1–1.0)
Monocytes Relative: 5 % (ref 3–12)
Neutro Abs: 1.1 10*3/uL — ABNORMAL LOW (ref 1.7–7.7)
Neutrophils Relative %: 44 % (ref 43–77)

## 2011-03-31 LAB — COMPREHENSIVE METABOLIC PANEL
ALT: 14 U/L (ref 0–35)
AST: 21 U/L (ref 0–37)
Albumin: 3.6 g/dL (ref 3.5–5.2)
Alkaline Phosphatase: 55 U/L (ref 39–117)
BUN: 18 mg/dL (ref 6–23)
CO2: 22 mEq/L (ref 19–32)
Calcium: 8.7 mg/dL (ref 8.4–10.5)
Chloride: 106 mEq/L (ref 96–112)
Creatinine, Ser: 0.7 mg/dL (ref 0.50–1.10)
GFR calc Af Amer: >90 mL/min (ref >90)
GFR calc non Af Amer: >90 mL/min (ref >90)
Glucose, Bld: 83 mg/dL (ref 70–99)
Potassium: 3.4 mEq/L — ABNORMAL LOW (ref 3.5–5.1)
Sodium: 139 mEq/L (ref 135–145)
Total Bilirubin: 0.2 mg/dL — ABNORMAL LOW (ref 0.3–1.2)
Total Protein: 6.6 g/dL (ref 6.0–8.3)

## 2011-03-31 LAB — PREGNANCY, URINE: Preg Test, Ur: NEGATIVE

## 2011-03-31 MED ORDER — ONDANSETRON HCL 4 MG/2ML IJ SOLN
4.0000 mg | Freq: Once | INTRAMUSCULAR | Status: AC
  Administered 2011-03-31: 4 mg via INTRAVENOUS
  Filled 2011-03-31: qty 2

## 2011-03-31 MED ORDER — SODIUM CHLORIDE 0.9 % IV BOLUS (SEPSIS)
1000.0000 mL | Freq: Once | INTRAVENOUS | Status: AC
  Administered 2011-03-31: 1000 mL via INTRAVENOUS

## 2011-03-31 MED ORDER — DIPHENOXYLATE-ATROPINE 2.5-0.025 MG PO TABS
1.0000 | ORAL_TABLET | Freq: Once | ORAL | Status: AC
  Administered 2011-03-31: 1 via ORAL
  Filled 2011-03-31: qty 1

## 2011-03-31 MED ORDER — MORPHINE SULFATE 4 MG/ML IJ SOLN: 4.0000 mg | Freq: Once | INTRAMUSCULAR | Status: DC

## 2011-03-31 MED ORDER — ONDANSETRON HCL 4 MG/2ML IJ SOLN: 4.0000 mg | Freq: Once | INTRAMUSCULAR | Status: DC

## 2011-03-31 NOTE — ED Notes (Signed)
Pt c/o vomiting and diarrhea since Saturday. Pt c/o abdominal pain that began after the N/V/D started.

## 2011-03-31 NOTE — ED Notes (Signed)
Pt has tolerated water and gingerale-given saltines and aware of d/c after current IVF/2nd L NS infused

## 2011-03-31 NOTE — ED Notes (Signed)
Pt given ginger ale.

## 2011-03-31 NOTE — ED Notes (Signed)
Pt tolerated po while in ED-no vomiting while in ED-no further diarrhea after lomotil

## 2011-03-31 NOTE — ED Provider Notes (Signed)
History     CSN: [id number] Arrival date & time: 03/31/2011  3:44 PM   First MD Initiated Contact with Patient 03/31/11 1546      Chief Complaint  Patient presents with  . Emesis    (Consider location/radiation/quality/duration/timing/severity/associated sxs/prior treatment) HPI Comments: Pt states that he whole family has had similar symptoms:pt states that when it started she had a syncopal episode and ems was called out and she had an ekg, but never went home:pt states that she has had zofran and that the vomiting has gotten better, but the diarrhea is not:pt states that she was sent over by her pcp for fluids  Patient is a 50 y.o. female presenting with vomiting. The history is provided by the patient. No language interpreter was used.  Emesis  This is a new problem. The current episode started 2 days ago. The problem occurs more than 10 times per day. The problem has been gradually improving. The emesis has an appearance of stomach contents. There has been no fever. Pertinent negatives include no cough and no fever. Risk factors include ill contacts.    Past Medical History  Diagnosis Date  . Lupus   . Migraines   . Narcolepsy     Past Surgical History  Procedure Date  . Abdominal hysterectomy     No family history on file.  History  Substance Use Topics  . Smoking status: Never Smoker   . Smokeless tobacco: Not on file  . Alcohol Use: No    OB History    Grav Para Term Preterm Abortions TAB SAB Ect Mult Living                  Review of Systems  Constitutional: Negative for fever.  Respiratory: Negative for cough.   Gastrointestinal: Positive for vomiting.  All other systems reviewed and are negative.    Allergies  Codeine; Lamisil; and Penicillins cross reactors  Home Medications   Current Outpatient Rx  Name Route Sig Dispense Refill  . ACETAMINOPHEN 325 MG PO TABS Oral Take 650 mg by mouth every 6 (six) hours as needed. For pain     . ASPIRIN  81 MG PO TABS Oral Take 81 mg by mouth daily.      Marland Kitchen VITAMIN D 1000 UNITS PO TABS Oral Take 1,000 Units by mouth daily.      Marland Kitchen ESTRADIOL 0.025 MG/24HR TD PTTW Transdermal Place 1 patch onto the skin 2 (two) times a week.      Marland Kitchen HYDROXYCHLOROQUINE SULFATE 200 MG PO TABS Oral Take 100 mg by mouth daily.      Marland Kitchen LORATADINE 10 MG PO TABS Oral Take 10 mg by mouth daily.      Marland Kitchen MAGNESIUM OXIDE 400 MG PO TABS Oral Take 400 mg by mouth daily.      Marland Kitchen MODAFINIL 100 MG PO TABS Oral Take 200 mg by mouth daily.      Marland Kitchen ALIGN 4 MG PO CAPS Oral Take 1 capsule by mouth daily.      Marland Kitchen RIZATRIPTAN BENZOATE 10 MG PO TBDP Oral Take 10 mg by mouth as needed. May repeat in 2 hours if needed     . TOPIRAMATE 100 MG PO TABS Oral Take 200 mg by mouth daily.      Marland Kitchen VITAMIN E 400 UNITS PO CAPS Oral Take 400 Units by mouth daily.        BP 115/57  Pulse 63  Temp(Src) 98.1 F (36.7 C) (Oral)  Resp 16  Ht 5\' 5"  (1.651 m)  Wt 116 lb (52.617 kg)  BMI 19.30 kg/m2  SpO2 100%  Physical Exam  Nursing note and vitals reviewed. Constitutional: She is oriented to person, place, and time. She appears well-developed and well-nourished.  HENT:  Head: Normocephalic and atraumatic.  Eyes: Pupils are equal, round, and reactive to light.  Neck: Normal range of motion. Neck supple.  Cardiovascular: Normal rate and regular rhythm.   Pulmonary/Chest: Effort normal and breath sounds normal.  Abdominal: Soft. Bowel sounds are increased.  Musculoskeletal: Normal range of motion.  Neurological: She is alert and oriented to person, place, and time.  Skin: Skin is warm and dry.    ED Course  Procedures (including critical care time)  Labs Reviewed  URINALYSIS, ROUTINE W REFLEX MICROSCOPIC - Abnormal; Notable for the following:    Bilirubin Urine SMALL (*)    Ketones, ur >80 (*)    All other components within normal limits  CBC - Abnormal; Notable for the following:    WBC 2.4 (*)    RDW 11.3 (*)    Platelets 148 (*)    All  other components within normal limits  DIFFERENTIAL - Abnormal; Notable for the following:    Lymphocytes Relative 49 (*)    Neutro Abs 1.1 (*)    All other components within normal limits  COMPREHENSIVE METABOLIC PANEL - Abnormal; Notable for the following:    Potassium 3.4 (*)    Total Bilirubin 0.2 (*)    All other components within normal limits  PREGNANCY, URINE   No results found.  Date: 03/31/2011  Rate: 61  Rhythm: normal sinus rhythm  QRS Axis: normal  Intervals: normal and PR shortened  ST/T Wave abnormalities: normal  Conduction Disutrbances:incomplete rbb  Narrative Interpretation:   Old EKG Reviewed: unchanged   1. Nausea vomiting and diarrhea   2. Syncope due to orthostatic hypotension       MDM  Pt is feeling better after the fluids:discussed wbc with pt:pt is okay to follow up as needed:pt ambulating without any problem        Teressa Lower, NP 03/31/11 1900

## 2011-03-31 NOTE — ED Provider Notes (Signed)
Medical screening examination/treatment/procedure(s) were performed by non-physician practitioner and as supervising physician I was immediately available for consultation/collaboration.  Kayler Rise, MD 03/31/11 2142 

## 2011-06-24 ENCOUNTER — Institutional Professional Consult (permissible substitution): Payer: BC Managed Care – PPO | Admitting: Pulmonary Disease

## 2011-07-01 ENCOUNTER — Encounter: Payer: Self-pay | Admitting: Pulmonary Disease

## 2011-07-01 ENCOUNTER — Ambulatory Visit (INDEPENDENT_AMBULATORY_CARE_PROVIDER_SITE_OTHER): Payer: BC Managed Care – PPO | Admitting: Pulmonary Disease

## 2011-07-01 VITALS — BP 100/64 | HR 64 | Temp 97.6°F | Ht 65.0 in | Wt 124.8 lb

## 2011-07-01 DIAGNOSIS — G4710 Hypersomnia, unspecified: Secondary | ICD-10-CM

## 2011-07-01 MED ORDER — MODAFINIL 100 MG PO TABS: 200.0000 mg | ORAL_TABLET | Freq: Every day | ORAL | Status: DC

## 2011-07-01 NOTE — Progress Notes (Signed)
  Subjective:    Patient ID: Cindy Fitzgerald, female    DOB: 1960/06/27, 51 y.o.   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 Epworth sleepiness score is 17/24. She report sleepiness and very suppression such as sitting and reading, watching TV, sitting inactive in a public place, lying down to rest in the afternoon. She sees Dr.Beekman for lupus and has been maintained on Plaquenil for 6 years. She does do the headache Center for migraines since 2000 and and has been maintained on Topamax nightly.  Bedtime is 10:50 PM, sleep latency is 5 minutes, she sleeps on her side with one pillow, 3-4 nocturnal awakenings spontaneously mainly due to her husband moving around (he has sleep apnea) , out of bed at 6 AM feeling tired with a dry mouth and occasional headache. A daily nap around 4 PM of 1 hour as refreshing There is no history suggestive of cataplexy, sleep paralysis or parasomnias Her weight has fluctuated but but is now at her baseline of 124 pounds. Overnight polysomnogram in 4/09 showed total sleep time 383 minutes, and normal REM latency, AHI was 2.7 per hour. This was repeated in 5/09 and showed an AHI of 8 events per hour with desaturation into the low 90s, REM latency was again normal. CPAP was titrated on a subsequent study to 6 cm with supine REM sleep. Since then, the patient has been maintained on 200 mg of Provigil in the morning with good results. She has been told that she may have narcolepsy. She used CPAP for a year and then stopped since sleepiness persisted  Review of Systems  Constitutional: Positive for unexpected weight change. Negative for fever.  HENT: Positive for ear pain, congestion, sore throat and sneezing. Negative for nosebleeds, rhinorrhea, trouble swallowing, dental problem, postnasal drip and sinus pressure.   Eyes: Negative for redness and itching.  Respiratory: Positive for cough  and shortness of breath. Negative for chest tightness and wheezing.   Cardiovascular: Negative for palpitations and leg swelling.  Gastrointestinal: Negative for nausea and vomiting.  Genitourinary: Negative for dysuria.  Musculoskeletal: Positive for arthralgias. Negative for joint swelling.  Skin: Negative for rash.  Neurological: Negative for headaches.  Hematological: Does not bruise/bleed easily.  Psychiatric/Behavioral: Negative for dysphoric mood. The patient is not nervous/anxious.        Objective:   Physical Exam  Gen. Pleasant, well-nourished, in no distress, normal affect ENT - no lesions, no post nasal drip, class 1 airway Neck: No JVD, no thyromegaly, no carotid bruits Lungs: no use of accessory muscles, no dullness to percussion, clear without rales or rhonchi  Cardiovascular: Rhythm regular, heart sounds  normal, no murmurs or gallops, no peripheral edema Abdomen: soft and non-tender, no hepatosplenomegaly, BS normal. Musculoskeletal: No deformities, no cyanosis or clubbing Neuro:  alert, non focal       Assessment & Plan:

## 2011-07-01 NOTE — Patient Instructions (Signed)
Schedule Nap test daytime Prefer that you stay off provigil for at least 1 week prior Nap x 1 hr Try to increase sleep time to 8h at night OK to stay on provigil 200 daily

## 2011-07-01 NOTE — Assessment & Plan Note (Signed)
I doubt the diagnosis of narcolepsy - of note she has no symptoms suggestive of cataplexy, the baseline polysomnogram does not show a shortened REM latency. A multiple sleep latency test was never performed. The differential diagnosis here includes idiopathic hypersomnolence and fatigue and somnolence due to lupus Schedule MSLT - ideally would prefer to get nocturnal polysomnogram on the night prior to MSL T. Prefer that you stay off provigil for at least 1 week prior Nap x 1 hr Try to increase sleep time to 8h at night OK to stay on provigil 200 daily

## 2011-07-21 ENCOUNTER — Encounter (HOSPITAL_BASED_OUTPATIENT_CLINIC_OR_DEPARTMENT_OTHER): Payer: Self-pay | Admitting: Radiology

## 2011-07-21 ENCOUNTER — Ambulatory Visit (HOSPITAL_BASED_OUTPATIENT_CLINIC_OR_DEPARTMENT_OTHER): Payer: BC Managed Care – PPO | Attending: Pulmonary Disease | Admitting: Radiology

## 2011-07-21 DIAGNOSIS — Z7982 Long term (current) use of aspirin: Secondary | ICD-10-CM | POA: Insufficient documentation

## 2011-07-21 DIAGNOSIS — R51 Headache: Secondary | ICD-10-CM | POA: Insufficient documentation

## 2011-07-21 DIAGNOSIS — G4710 Hypersomnia, unspecified: Secondary | ICD-10-CM

## 2011-07-21 DIAGNOSIS — Z79899 Other long term (current) drug therapy: Secondary | ICD-10-CM | POA: Insufficient documentation

## 2011-07-31 ENCOUNTER — Telehealth: Payer: Self-pay | Admitting: Pulmonary Disease

## 2011-07-31 DIAGNOSIS — G4710 Hypersomnia, unspecified: Secondary | ICD-10-CM

## 2011-07-31 NOTE — Telephone Encounter (Signed)
Pt was scheduled for appt on 3/11 with TP ok per Shanda Bumps to get her sleep results.

## 2011-07-31 NOTE — Telephone Encounter (Signed)
Pl change her appt to me instead of TP Her MSLT did not show narcolepsy - OK to resume provigil -will discuss in detail on OV

## 2011-08-01 ENCOUNTER — Ambulatory Visit: Payer: BC Managed Care – PPO | Admitting: Pulmonary Disease

## 2011-08-01 NOTE — Procedures (Signed)
NAMEHEDAYA, LATENDRESSE               ACCOUNT NO.:  [id number]  MEDICAL RECORD NO.:  [id number]          PATIENT TYPE:  OUT  LOCATION:  SLEEP CENTER                 FACILITY:  [facility]  PHYSICIAN:  Oretha Milch, MD      DATE OF BIRTH:  [birth year]  DATE OF STUDY:  07/31/2011                       MAINTENANCE OF WAKEFULNESS TEST  REFERRING PHYSICIAN:  Oretha Milch, MD  INDICATION FOR THE STUDY:  Excessive daytime somnolence in this 51 year old woman with a history of headache.Overnight polysomnogram in 4/09 showed total sleep time 383 minutes, and normal REM latency, AHI was 2.7 per hour. This was repeated in 5/09 and showed an AHI of 8 events per hour with desaturation into the low 90s, REM latency was again normal. CPAP was titrated on a subsequent study to 6 cm with supine REM sleep.Since then, the patient has been maintained on 200 mg of Provigil in the morning with good results. At the time of this study, she weighed 120 pounds with a height of 5 feet 5 inches, BMI of 20, neck size of 14 inches.  EPWORTH SLEEPINESS:  14 of 16.  MEDICATIONS:  Include aspirin, vitamin D, Plaquenil, Claritin, magnesium, Provigil, vitamin E, Topamax, probiotic, Urocit Maxalt. She was asked to stop Provigil for at least a week prior to the study.  This multiple sleep latency testing was performed with a sleep technologist in attendance.  EEG, EOG, EMG were recorded.  Sleep stages, arousals were scored according to criteria laid out by the American academy of [employer].  She reported 7 hours of uninterrupted sleep the night before.  On the first nap lights off was at 8 a.m., lights on was at 8:19 a.m. sleep latency was 4 minutes.  REM sleep was not noted.   On the second nap lights off was at 10 a.m., lights on was at 10:26 a.m., sleep latency was 11 minute.  REM sleep was not noted.   On the third nap lights off was at 12 noon, and lights on was at 12:21 p.m. Sleep latency was 6.5 minutes.  REM sleep  was not noted.   On the fourth nap lights off was at 2 p.m., lights on was at 2:17 p.m.  Sleep latency was 2 minutes. REM sleep was not noted.   On the fifth nap, lights off was at 4 p.m., lights on was at 4:31 a.m.  Sleep latency was 18 minutes.  REM sleep was again not noted.  IMPRESSION:  The mean sleep latency was 8.3 minutes.  No REM sleep was noted on any of the 5 naps.   This finding confirms hypersomnolence but is not consistent with a diagnosis of narcolepsy. If other causes of hypersomnolence are ruled out such as obstructive sleep apnea, limb movements during sleep or Medications, then this can be labeled as idiopathic hypersomnolence.  RECOMMENDATION:  The treatment options for this degree of hypersomnolence includes increasing sleep time to at least 8-9 hours, adding a nap of 30-60 minutes in the afternoon, addition of Provigil especially on days when increased vigilance and concentration is required.     Oretha Milch, MD    RVA/MEDQ  D:  07/31/2011 13:12:13  T:  08/01/2011 03:22:14  Job:  [id number]

## 2011-08-04 ENCOUNTER — Ambulatory Visit: Payer: BC Managed Care – PPO | Admitting: Adult Health

## 2011-08-04 NOTE — Telephone Encounter (Signed)
Pt notified of MLST result and appt has been cancelled with TP today. Pt will now see RA on Thurs., 09/11/11 @ 2:45pm. Pt will resume Provigil until OV with RA. Pt verbalized understanding.

## 2011-09-11 ENCOUNTER — Ambulatory Visit (INDEPENDENT_AMBULATORY_CARE_PROVIDER_SITE_OTHER): Payer: BC Managed Care – PPO | Admitting: Pulmonary Disease

## 2011-09-11 ENCOUNTER — Encounter: Payer: Self-pay | Admitting: Pulmonary Disease

## 2011-09-11 VITALS — BP 112/64 | HR 72 | Temp 98.4°F | Ht 65.0 in | Wt 125.8 lb

## 2011-09-11 DIAGNOSIS — G4710 Hypersomnia, unspecified: Secondary | ICD-10-CM

## 2011-09-11 NOTE — Assessment & Plan Note (Addendum)
The treatment options for this degree of hypersomnolence includes increasing sleep time to at least 8-9 hours,  adding a nap of 30-60 minutes in the afternoon, addition of Provigil especially on days when increased vigilance and concentration is required. 'idiopathic hypersomnolence ' - may be related to lupus, Absence of SOREMs on MSLT makes narcolepsy unlikely We discussed nuvigil instead of provigil - will use 200 at 8a & 100 at 2pm Nap ok & recommended Sleep time at least 8h Diet & exercise

## 2011-09-11 NOTE — Patient Instructions (Signed)
You have 'idiopathic hypersomnolence ' - may be related to lupus We discussed nuvigil instead of provigil Nap ok Sleep time at least 8h

## 2011-09-11 NOTE — Progress Notes (Signed)
  Subjective:    Patient ID: Cindy Fitzgerald, female    DOB: 02-Feb-1961, 51 y.o.   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 Epworth sleepiness score is 17/24. She report sleepiness and very suppression such as sitting and reading, watching TV, sitting inactive in a public place, lying down to rest in the afternoon. She sees Dr.Beekman for lupus and has been maintained on Plaquenil for 6 years. She does do the headache [facility] for migraines since 2000 and and has been maintained on Topamax nightly.  Bedtime is 10:50 PM, sleep latency is 5 minutes, she sleeps on her side with one pillow, 3-4 nocturnal awakenings spontaneously mainly due to her husband moving around (he has sleep apnea) , out of bed at 6 AM feeling tired with a dry mouth and occasional headache. A daily nap around 4 PM of 1 hour as refreshing Her weight has fluctuated but but is now at her baseline of 124 pounds. Overnight polysomnogram in 4/09 showed total sleep time 383 minutes, and normal REM latency, AHI was 2.7 per hour. This was repeated in 5/09 and showed an AHI of 8 events per hour with desaturation into the low 90s, REM latency was again normal. CPAP was titrated on a subsequent study to 6 cm with supine REM sleep. Since then, the patient has been maintained on 200 mg of Provigil in the morning with good results. She has been told that she may have narcolepsy. She used CPAP for a year and then stopped since sleepiness persisted      09/11/2011 MSLT showed avg sleep latency of 8 mins,no SOREMs She did sleep x 8h the night before stayed off provigil for at least 1 week prior  Has trried to implement Nap x 30 m  Tried to increase sleep time to 8h at night  Was taking provigil 100 bid   Review of Systems  Constitutional: Positive for unexpected weight change. Negative for fever.  HENT: Positive for ear pain, congestion, sore  throat and sneezing. Negative for nosebleeds, rhinorrhea, trouble swallowing, dental problem, postnasal drip and sinus pressure.   Eyes: Negative for redness and itching.  Respiratory: Positive for cough and shortness of breath. Negative for chest tightness and wheezing.   Cardiovascular: Negative for palpitations and leg swelling.  Gastrointestinal: Negative for nausea and vomiting.  Genitourinary: Negative for dysuria.  Musculoskeletal: Positive for arthralgias. Negative for joint swelling.  Skin: Negative for rash.  Neurological: Negative for headaches.  Hematological: Does not bruise/bleed easily.  Psychiatric/Behavioral: Negative for dysphoric mood. The patient is not nervous/anxious.        Objective:   Physical Exam   Gen. Pleasant, well-nourished, in no distress, normal affect ENT - no lesions, no post nasal drip, class 1 airway Neck: No JVD, no thyromegaly, no carotid bruits Lungs: no use of accessory muscles, no dullness to percussion, clear without rales or rhonchi  Cardiovascular: Rhythm regular, heart sounds  normal, no murmurs or gallops, no peripheral edema Abdomen: soft and non-tender, no hepatosplenomegaly, BS normal. Musculoskeletal: No deformities, no cyanosis or clubbing Neuro:  alert, non focal       Assessment & Plan:

## 2011-12-04 ENCOUNTER — Other Ambulatory Visit: Payer: Self-pay | Admitting: Obstetrics

## 2011-12-04 DIAGNOSIS — Z1231 Encounter for screening mammogram for malignant neoplasm of breast: Secondary | ICD-10-CM

## 2012-01-06 ENCOUNTER — Ambulatory Visit
Admission: RE | Admit: 2012-01-06 | Discharge: 2012-01-06 | Disposition: A | Discharge status: Home / Self Care | Payer: BC Managed Care – PPO | Source: Ambulatory Visit | Attending: Obstetrics | Admitting: Obstetrics

## 2012-01-06 DIAGNOSIS — Z1231 Encounter for screening mammogram for malignant neoplasm of breast: Secondary | ICD-10-CM

## 2012-01-06 IMAGING — MG MM DIGITAL SCREENING BILAT
9 series · 9 of 25 positions shown · non-contrast
Comparison: Previous exams.

CLINICAL DATA: Screening.

DIGITAL BILATERAL SCREENING MAMMOGRAM WITH CAD
DIGITAL BREAST TOMOSYNTHESIS
Digital breast tomosynthesis images are acquired in two
projections.  These images are reviewed in combination with the
digital mammogram, confirming the findings below.

[R MLO (1 of 2)]
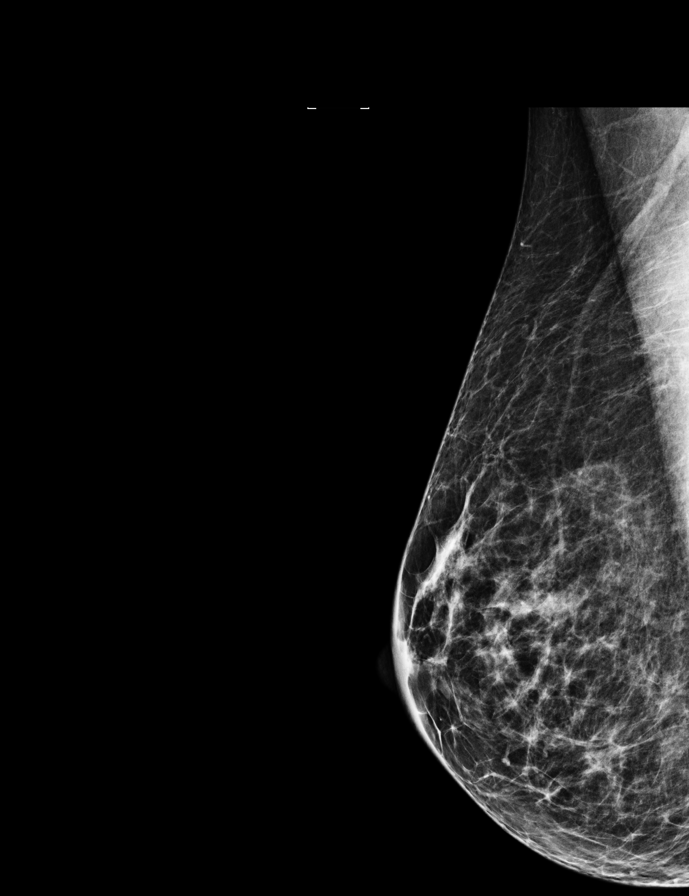

[R CC]
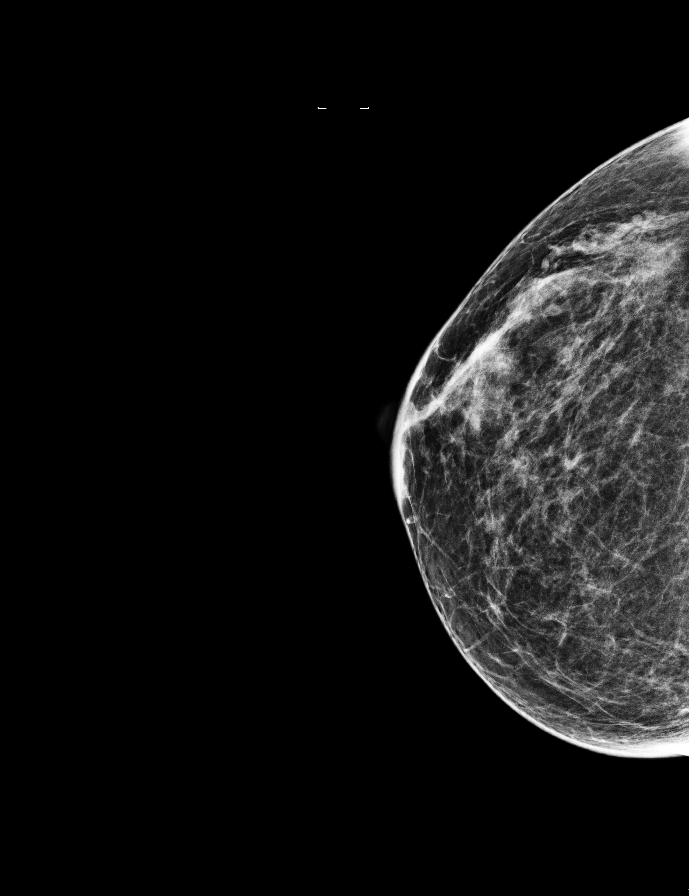

[L CC]
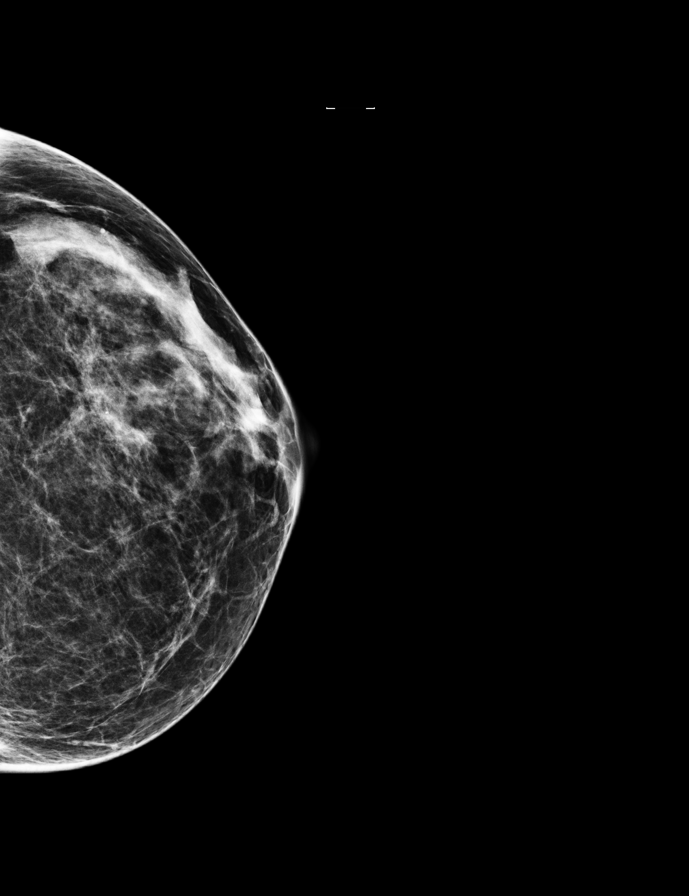

[R MLO (2 of 2)]
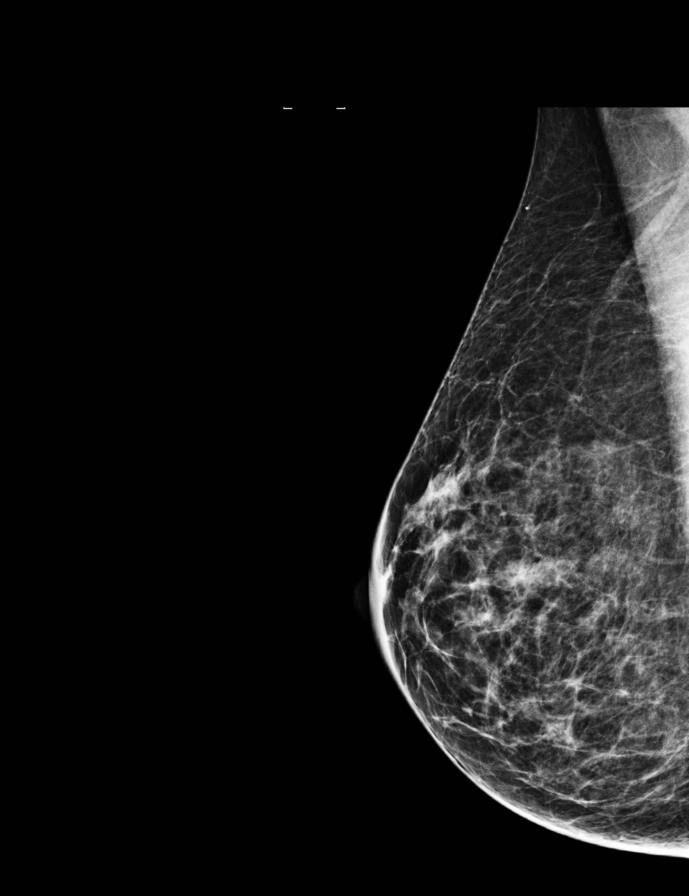

[L MLO]
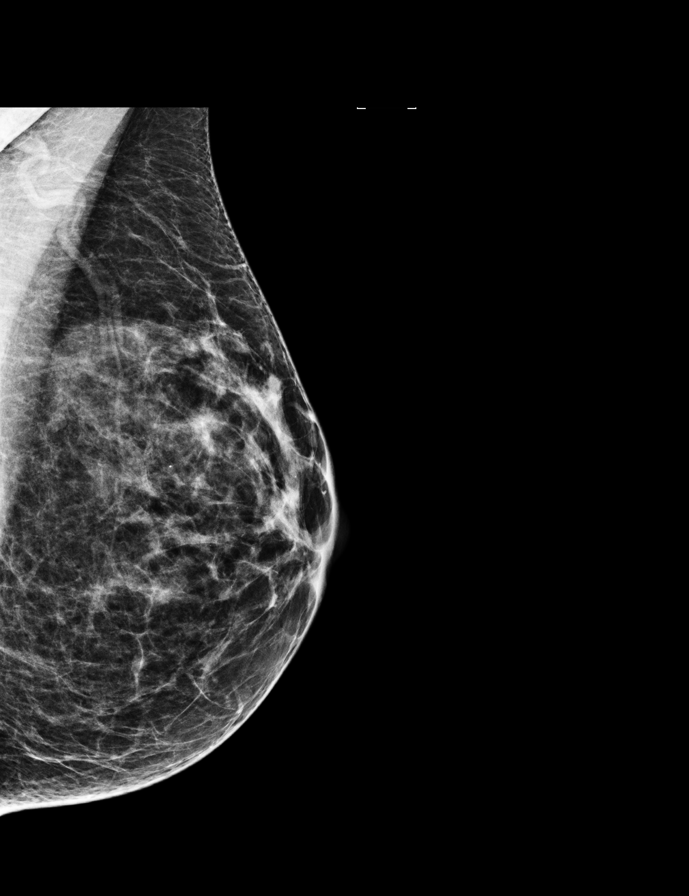

[LMLO COMBO BREAST TOMOSYNTHESIS IMAGE tomo · tomo slice 27/54.0]
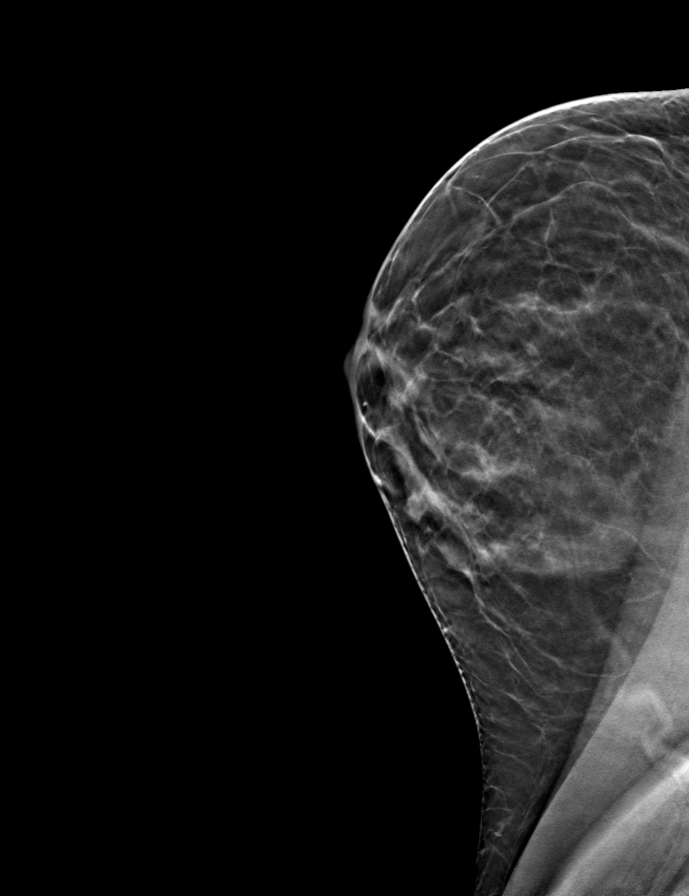

[RMLO COMBO BREAST TOMOSYNTHESIS IMAGE tomo · tomo slice 27/54.0]
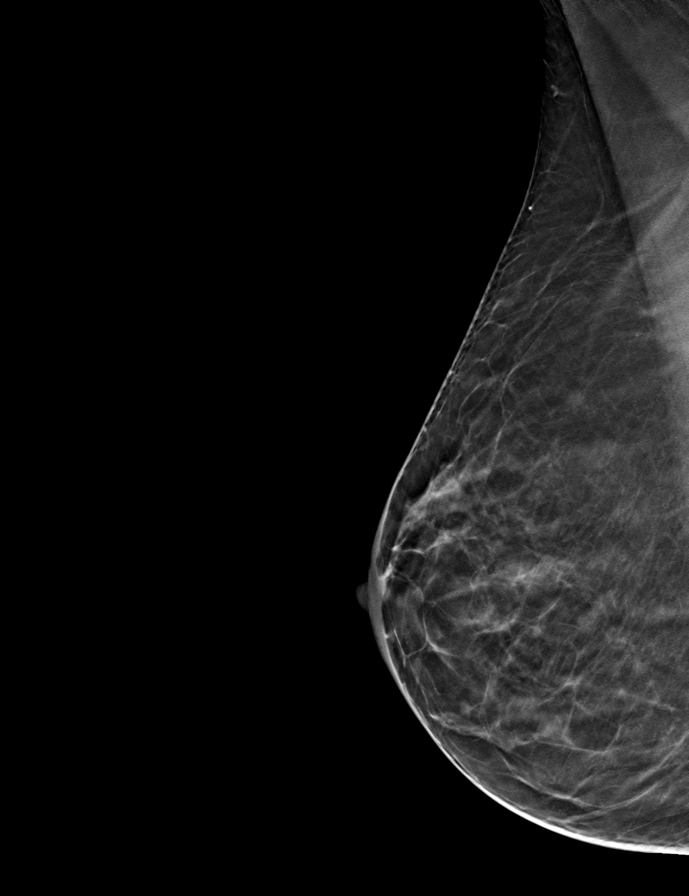

[LCC COMBO BREAST TOMOSYNTHESIS IMAGE tomo · tomo slice 27/53.0]
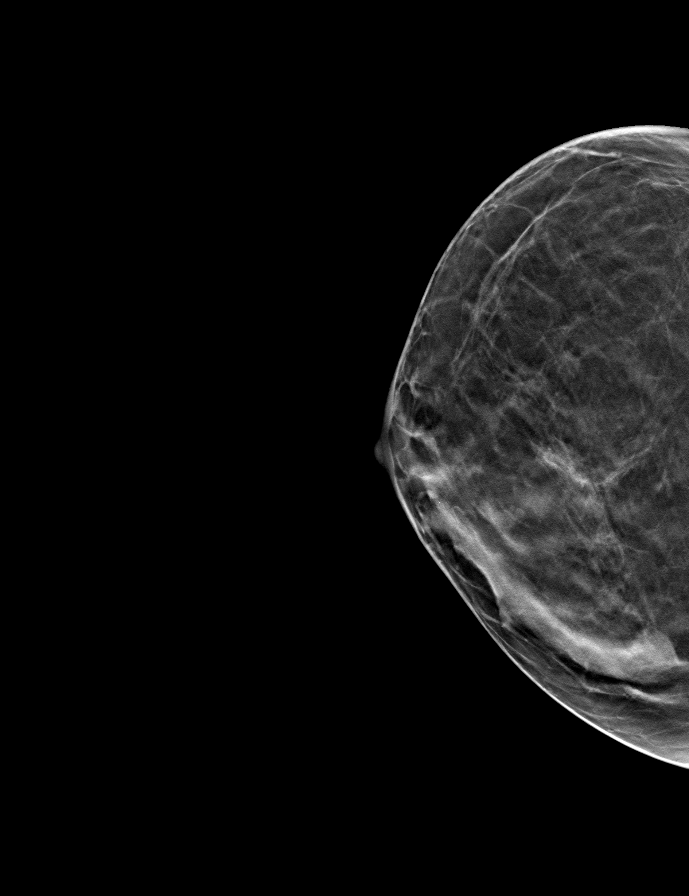

[RCC COMBO BREAST TOMOSYNTHESIS IMAGE tomo · tomo slice 27/54.0]
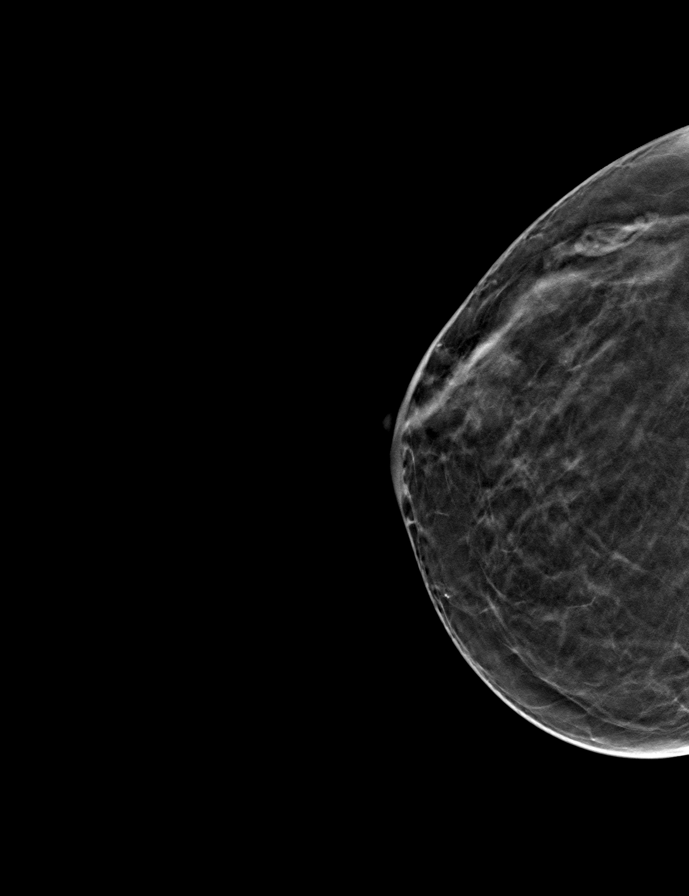

[9 of 25 positions shown; findings below may reference images not displayed]

FINDINGS: There are scattered fibroglandular densities. No
suspicious masses, architectural distortion, or calcifications are
present.

Images were processed with CAD.
IMPRESSION: No mammographic evidence of malignancy.

A result letter of this screening mammogram will be mailed directly
to the patient.

RECOMMENDATION:
Screening mammogram in one year. (Code:[1L])

BI-RADS CATEGORY 1:  Negative.

## 2012-03-31 ENCOUNTER — Ambulatory Visit: Payer: BC Managed Care – PPO | Admitting: Pulmonary Disease

## 2012-12-10 ENCOUNTER — Other Ambulatory Visit: Payer: Self-pay

## 2012-12-10 DIAGNOSIS — Z1231 Encounter for screening mammogram for malignant neoplasm of breast: Secondary | ICD-10-CM

## 2013-01-07 ENCOUNTER — Ambulatory Visit
Admission: RE | Admit: 2013-01-07 | Discharge: 2013-01-07 | Disposition: A | Discharge status: Home / Self Care | Payer: BC Managed Care – PPO | Source: Ambulatory Visit

## 2013-01-07 DIAGNOSIS — Z1231 Encounter for screening mammogram for malignant neoplasm of breast: Secondary | ICD-10-CM

## 2013-01-11 ENCOUNTER — Other Ambulatory Visit: Payer: Self-pay | Admitting: Obstetrics

## 2013-01-11 DIAGNOSIS — R928 Other abnormal and inconclusive findings on diagnostic imaging of breast: Secondary | ICD-10-CM

## 2013-01-28 ENCOUNTER — Ambulatory Visit
Admission: RE | Admit: 2013-01-28 | Discharge: 2013-01-28 | Disposition: A | Discharge status: Home / Self Care | Payer: BC Managed Care – PPO | Source: Ambulatory Visit | Attending: Obstetrics | Admitting: Obstetrics

## 2013-01-28 DIAGNOSIS — R928 Other abnormal and inconclusive findings on diagnostic imaging of breast: Secondary | ICD-10-CM

## 2013-05-11 ENCOUNTER — Other Ambulatory Visit: Payer: Self-pay | Admitting: Family Medicine

## 2013-05-11 DIAGNOSIS — E2839 Other primary ovarian failure: Secondary | ICD-10-CM

## 2013-05-12 ENCOUNTER — Ambulatory Visit
Admission: RE | Admit: 2013-05-12 | Discharge: 2013-05-12 | Disposition: A | Discharge status: Home / Self Care | Payer: BC Managed Care – PPO | Source: Ambulatory Visit | Attending: Family Medicine | Admitting: Family Medicine

## 2013-05-12 DIAGNOSIS — E2839 Other primary ovarian failure: Secondary | ICD-10-CM

## 2013-08-05 ENCOUNTER — Ambulatory Visit (INDEPENDENT_AMBULATORY_CARE_PROVIDER_SITE_OTHER): Payer: BC Managed Care – PPO | Admitting: Cardiovascular Disease

## 2013-08-05 ENCOUNTER — Encounter: Payer: Self-pay | Admitting: Cardiovascular Disease

## 2013-08-05 VITALS — BP 130/84 | HR 64 | Ht 65.0 in | Wt 130.8 lb

## 2013-08-05 DIAGNOSIS — R079 Chest pain, unspecified: Secondary | ICD-10-CM | POA: Insufficient documentation

## 2013-08-05 NOTE — Patient Instructions (Signed)
Your physician recommends that you schedule a follow-up appointment in:   AS NEEDED   Your physician recommends that you continue on your current medications as directed. Please refer to the Current Medication list given to you today.   Your physician has requested that you have a stress echocardiogram. For further information please visit [email or web address]. Please follow instruction sheet as given.  

## 2013-08-05 NOTE — Assessment & Plan Note (Signed)
Atypical normal ECG  In setting of lupus prefer stress echo to r/o valve disease, effusion and DCM as well

## 2013-08-05 NOTE — Progress Notes (Signed)
Patient ID: Cindy Fitzgerald, female   DOB: Aug 24, 1960, 53 y.o.   MRN: [id number]  53 yo referred by Dr Paulino Rily for chest pain.  Recent UTI finished antibiotics last week Frequent kidney stones Sees Otelin.  Started with epigastric and flank pain.  Then complained of squeezing pain between breasts.  Pain last few weeks.  Pain is not pleuritic or positional  Not always worse with exertion but sometimes.  No real risk factors although dad had CVA Pain is persistant Nothing makes it better or worse.    She has lupus for about 8 years On plaquinil and occasional steroids  Raynauds in hands no other vascular disease     ROS: Denies fever, malais, weight loss, blurry vision, decreased visual acuity, cough, sputum, SOB, hemoptysis, pleuritic pain, palpitaitons, heartburn, abdominal pain, melena, lower extremity edema, claudication, or rash.  All other systems reviewed and negative   General: Affect appropriate Healthy:  appears stated age HEENT: normal Neck supple with no adenopathy JVP normal no bruits no thyromegaly Lungs clear with no wheezing and good diaphragmatic motion Heart:  S1/S2 no murmur,rub, gallop or click PMI normal Abdomen: benighn, BS positve, no tenderness, no AAA no bruit.  No HSM or HJR Distal pulses intact with no bruits No edema Neuro non-focal Skin warm and dry No muscular weakness  Medications Current Outpatient Prescriptions  Medication Sig Dispense Refill  . aspirin 81 MG tablet Take 81 mg by mouth daily.        . Calcium Carbonate-Vit D-Min (CALCIUM 1200 PO) Take 1,500 mg by mouth daily.      . cetirizine (ZYRTEC) 10 MG tablet Take 10 mg by mouth daily.      . cholecalciferol (VITAMIN D) 1000 UNITS tablet Take 1,000 Units by mouth daily.        Marland Kitchen esomeprazole (NEXIUM) 20 MG capsule Take 20 mg by mouth daily at 12 noon.      Marland Kitchen estradiol (VIVELLE-DOT) 0.0375 MG/24HR Place 1 patch onto the skin 2 (two) times a week.      . hydroxychloroquine (PLAQUENIL) 200 MG  tablet Take 100 mg by mouth daily.        Marland Kitchen ibuprofen (ADVIL,MOTRIN) 100 MG chewable tablet Chew 400 mg by mouth every 8 (eight) hours as needed.      . Lactobacillus Rhamnosus, GG, (CULTURELLE PO) Take by mouth.      . magnesium oxide (MAG-OX) 400 MG tablet Take 400 mg by mouth daily.        . meloxicam (MOBIC) 15 MG tablet Take 15 mg by mouth daily.      . modafinil (PROVIGIL) 100 MG tablet Take 2 tablets (200 mg total) by mouth daily.  60 tablet  3  . Multiple Vitamin (MULTI VITAMIN DAILY PO) Take by mouth.      . ranitidine (ZANTAC) 300 MG tablet Take 300 mg by mouth at bedtime.      . rizatriptan (MAXALT-MLT) 10 MG disintegrating tablet Take 10 mg by mouth as needed. May repeat in 2 hours if needed       . zonisamide (ZONEGRAN) 100 MG capsule Take 400 mg by mouth daily.        No current facility-administered medications for this visit.    Allergies Adhesive; Codeine; Other; Terbinafine hcl; Latex; and Penicillins cross reactors  Family History: Family History  Problem Relation Age of Onset  . Asthma Mother   . Hypertension Mother   . CVA Mother   . Heart disease Father   .  Stroke Father   . Macular degeneration Father   . Diabetes type II Father   . Kidney failure Father   . Heart disease Maternal Grandfather   . Heart disease Maternal Grandmother   . Heart disease Paternal Grandfather   . Cancer Paternal Grandmother     Social History: History   Social History  . Marital Status: Married    Spouse Name: N/A    Number of Children: N/A  . Years of Education: N/A   Occupational History  . Not on file.   Social History Main Topics  . Smoking status: Never Smoker   . Smokeless tobacco: Never Used  . Alcohol Use: No  . Drug Use: No  . Sexual Activity: Not on file   Other Topics Concern  . Not on file   Social History Narrative  . No narrative on file    Electrocardiogram:  Assessment and Plan

## 2013-08-11 ENCOUNTER — Ambulatory Visit (HOSPITAL_COMMUNITY)
Admission: RE | Admit: 2013-08-11 | Discharge: 2013-08-11 | Disposition: A | Discharge status: Home / Self Care | Payer: BC Managed Care – PPO | Source: Ambulatory Visit | Attending: Cardiovascular Disease | Admitting: Cardiovascular Disease

## 2013-08-11 DIAGNOSIS — R072 Precordial pain: Secondary | ICD-10-CM

## 2013-08-11 DIAGNOSIS — R079 Chest pain, unspecified: Secondary | ICD-10-CM | POA: Insufficient documentation

## 2013-08-11 NOTE — Progress Notes (Signed)
GXT Echo performed. Pt reached target HR in stage III, exercise continued. Images obtained. Final report pending.  Theodore Demarkhonda Barrett, PA-C 08/11/2013 11:22 AM Beeper [phone]

## 2014-01-09 ENCOUNTER — Other Ambulatory Visit: Payer: Self-pay

## 2014-01-09 DIAGNOSIS — Z1231 Encounter for screening mammogram for malignant neoplasm of breast: Secondary | ICD-10-CM

## 2014-01-17 ENCOUNTER — Ambulatory Visit
Admission: RE | Admit: 2014-01-17 | Discharge: 2014-01-17 | Disposition: A | Discharge status: Home / Self Care | Payer: BC Managed Care – PPO | Source: Ambulatory Visit

## 2014-01-17 DIAGNOSIS — Z1231 Encounter for screening mammogram for malignant neoplasm of breast: Secondary | ICD-10-CM

## 2015-01-23 ENCOUNTER — Other Ambulatory Visit: Payer: Self-pay

## 2015-01-23 DIAGNOSIS — Z1231 Encounter for screening mammogram for malignant neoplasm of breast: Secondary | ICD-10-CM

## 2015-01-27 ENCOUNTER — Emergency Department (HOSPITAL_BASED_OUTPATIENT_CLINIC_OR_DEPARTMENT_OTHER)
Admission: EM | Admit: 2015-01-27 | Discharge: 2015-01-27 | Disposition: A | Discharge status: Home / Self Care | Payer: BLUE CROSS/BLUE SHIELD | Attending: Emergency Medicine | Admitting: Emergency Medicine

## 2015-01-27 ENCOUNTER — Encounter (HOSPITAL_BASED_OUTPATIENT_CLINIC_OR_DEPARTMENT_OTHER): Payer: Self-pay | Admitting: Emergency Medicine

## 2015-01-27 DIAGNOSIS — Z8739 Personal history of other diseases of the musculoskeletal system and connective tissue: Secondary | ICD-10-CM | POA: Insufficient documentation

## 2015-01-27 DIAGNOSIS — Z7982 Long term (current) use of aspirin: Secondary | ICD-10-CM | POA: Diagnosis not present

## 2015-01-27 DIAGNOSIS — Z87442 Personal history of urinary calculi: Secondary | ICD-10-CM | POA: Diagnosis not present

## 2015-01-27 DIAGNOSIS — M1990 Unspecified osteoarthritis, unspecified site: Secondary | ICD-10-CM | POA: Insufficient documentation

## 2015-01-27 DIAGNOSIS — Z8679 Personal history of other diseases of the circulatory system: Secondary | ICD-10-CM | POA: Diagnosis not present

## 2015-01-27 DIAGNOSIS — Z8669 Personal history of other diseases of the nervous system and sense organs: Secondary | ICD-10-CM | POA: Insufficient documentation

## 2015-01-27 DIAGNOSIS — G43909 Migraine, unspecified, not intractable, without status migrainosus: Secondary | ICD-10-CM | POA: Diagnosis not present

## 2015-01-27 DIAGNOSIS — R040 Epistaxis: Secondary | ICD-10-CM | POA: Insufficient documentation

## 2015-01-27 DIAGNOSIS — Z880 Allergy status to penicillin: Secondary | ICD-10-CM | POA: Insufficient documentation

## 2015-01-27 DIAGNOSIS — Z91040 Latex allergy status: Secondary | ICD-10-CM | POA: Insufficient documentation

## 2015-01-27 DIAGNOSIS — Z79899 Other long term (current) drug therapy: Secondary | ICD-10-CM | POA: Insufficient documentation

## 2015-01-27 MED ORDER — OXYMETAZOLINE HCL 0.05 % NA SOLN
1.0000 | Freq: Once | NASAL | Status: AC
  Administered 2015-01-27: 1 via NASAL
  Filled 2015-01-27: qty 15

## 2015-01-27 NOTE — ED Notes (Signed)
Patient states that she sneezed and her nose started to bleed

## 2015-01-27 NOTE — Discharge Instructions (Signed)
Afrin nasal spray as needed, return if leading does not stop after 10 minutes.

## 2015-01-27 NOTE — ED Provider Notes (Signed)
CSN: [id number]     Arrival date & time 01/27/15  2022 History  This chart was scribed for Eber Hong, MD by Doreatha Martin, ED Scribe. This patient was seen in room MH02/MH02 and the patient's care was started at 8:58 PM.     Chief Complaint  Patient presents with  . Epistaxis   The history is provided by the patient. No language interpreter was used.    HPI Comments: Cindy Fitzgerald is a 54 y.o. female with Hx of Lupus, migraines, narcolepsy, Raynaud's disease, renal calculi who presents to the Emergency Department complaining of moderate bilateral epistaxis with controlled bleeding onset 45 minutes PTA after sneezing and then blowing her nose. She states associated postnasal drip that is improving. No Hx of similar symptoms. No Hx of PE/DVT, DM, HTN, HLD. Pt is on 81 mg ASA qday. She denies any pain or other symptoms.   Past Medical History  Diagnosis Date  . Lupus   . Migraines   . Narcolepsy   . Raynaud's disease   . Arthritis   . Lupus   . Migraine   . Vitamin deficiency     low d  . Hypersomnolence   . Osteopenia   . Kidney stones   . Sleep apnea     no cpap   Past Surgical History  Procedure Laterality Date  . Abdominal hysterectomy     Family History  Problem Relation Age of Onset  . Asthma Mother   . Hypertension Mother   . CVA Mother   . Heart disease Father   . Stroke Father   . Macular degeneration Father   . Diabetes type II Father   . Kidney failure Father   . Heart disease Maternal Grandfather   . Heart disease Maternal Grandmother   . Heart disease Paternal Grandfather   . Cancer Paternal Grandmother    Social History  Substance Use Topics  . Smoking status: Never Smoker   . Smokeless tobacco: Never Used  . Alcohol Use: No   OB History    No data available     Review of Systems  HENT: Positive for nosebleeds and postnasal drip.   Musculoskeletal: Negative for arthralgias.   Allergies  Adhesive; Codeine; Other; Terbinafine hcl; Latex; and  Penicillins cross reactors  Home Medications   Prior to Admission medications   Medication Sig Start Date End Date Taking? Authorizing Provider  aspirin 81 MG tablet Take 81 mg by mouth daily.      Historical Provider, MD  Calcium Carbonate-Vit D-Min (CALCIUM 1200 PO) Take 1,500 mg by mouth daily.    Historical Provider, MD  cetirizine (ZYRTEC) 10 MG tablet Take 10 mg by mouth daily.    Historical Provider, MD  cholecalciferol (VITAMIN D) 1000 UNITS tablet Take 1,000 Units by mouth daily.      Historical Provider, MD  esomeprazole (NEXIUM) 20 MG capsule Take 20 mg by mouth daily at 12 noon.    Historical Provider, MD  estradiol (VIVELLE-DOT) 0.0375 MG/24HR Place 1 patch onto the skin 2 (two) times a week.    Historical Provider, MD  hydroxychloroquine (PLAQUENIL) 200 MG tablet Take 100 mg by mouth daily.      Historical Provider, MD  ibuprofen (ADVIL,MOTRIN) 100 MG chewable tablet Chew 400 mg by mouth every 8 (eight) hours as needed.    Historical Provider, MD  Lactobacillus Rhamnosus, GG, (CULTURELLE PO) Take by mouth.    Historical Provider, MD  magnesium oxide (MAG-OX) 400 MG tablet Take 400  mg by mouth daily.      Historical Provider, MD  meloxicam (MOBIC) 15 MG tablet Take 15 mg by mouth daily.    Historical Provider, MD  modafinil (PROVIGIL) 100 MG tablet Take 2 tablets (200 mg total) by mouth daily. 07/01/11   Oretha Milch, MD  Multiple Vitamin (MULTI VITAMIN DAILY PO) Take by mouth.    Historical Provider, MD  ranitidine (ZANTAC) 300 MG tablet Take 300 mg by mouth at bedtime.    Historical Provider, MD  rizatriptan (MAXALT-MLT) 10 MG disintegrating tablet Take 10 mg by mouth as needed. May repeat in 2 hours if needed     Historical Provider, MD  zonisamide (ZONEGRAN) 100 MG capsule Take 400 mg by mouth daily.     Historical Provider, MD   BP 143/77 mmHg  Pulse 67  Temp(Src) 98 F (36.7 C) (Oral)  Resp 18  Ht  (1.651 m)  Wt 130 lb (58.968 kg)  BMI 21.63 kg/m2  SpO2  100% Physical Exam  Constitutional: She appears well-developed and well-nourished.  HENT:  Head: Normocephalic and atraumatic.  Nose: Nose normal. No epistaxis.  Evidence of crusting at nares - no bleeding seen inside - mucosa clear  Eyes: Conjunctivae are normal. Right eye exhibits no discharge. Left eye exhibits no discharge.  Pulmonary/Chest: Effort normal. No respiratory distress.  Neurological: She is alert. Coordination normal.  Skin: Skin is warm and dry. No rash noted. She is not diaphoretic. No erythema.  Psychiatric: She has a normal mood and affect.  Nursing note and vitals reviewed.  ED Course  Procedures (including critical care time) DIAGNOSTIC STUDIES: Oxygen Saturation is 100% on RA, normal by my interpretation.    COORDINATION OF CARE: 9:04 PM Discussed treatment plan with pt at bedside and pt agreed to plan.    Labs Review Labs Reviewed - No data to display  Imaging Review No results found. I have personally reviewed and evaluated these images and lab results as part of my medical decision-making.   MDM   Final diagnoses:  Epistaxis    The nosebleed seems to stopped prior to my exam, there are no areas of bleeding on inspection of the nose, no blood in the oropharynx, no continued bleeding after Afrin. The patient has been evaluated multiple times during her stay, I have personally looked in her nostrils multiple times, she has been made aware of the instructions for return, she feels comfortable with the discharge plan.   Meds given in ED:  Medications  oxymetazoline (AFRIN) 0.05 % nasal spray 1 spray (1 spray Each Nare Given 01/27/15 2112)    New Prescriptions   No medications on file   I personally performed the services described in this documentation, which was scribed in my presence. The recorded information has been reviewed and is accurate.      Eber Hong, MD 01/27/15 2220

## 2015-02-28 ENCOUNTER — Ambulatory Visit
Admission: RE | Admit: 2015-02-28 | Discharge: 2015-02-28 | Disposition: A | Discharge status: Home / Self Care | Payer: BLUE CROSS/BLUE SHIELD | Source: Ambulatory Visit

## 2015-02-28 DIAGNOSIS — Z1231 Encounter for screening mammogram for malignant neoplasm of breast: Secondary | ICD-10-CM

## 2015-03-01 ENCOUNTER — Other Ambulatory Visit: Payer: Self-pay | Admitting: Obstetrics

## 2015-03-01 DIAGNOSIS — R928 Other abnormal and inconclusive findings on diagnostic imaging of breast: Secondary | ICD-10-CM

## 2015-03-09 ENCOUNTER — Other Ambulatory Visit: Payer: Self-pay | Admitting: Obstetrics

## 2015-03-09 ENCOUNTER — Ambulatory Visit
Admission: RE | Admit: 2015-03-09 | Discharge: 2015-03-09 | Disposition: A | Discharge status: Home / Self Care | Payer: BLUE CROSS/BLUE SHIELD | Source: Ambulatory Visit | Attending: Obstetrics | Admitting: Obstetrics

## 2015-03-09 DIAGNOSIS — N6310 Unspecified lump in the right breast, unspecified quadrant: Secondary | ICD-10-CM

## 2015-03-09 DIAGNOSIS — R928 Other abnormal and inconclusive findings on diagnostic imaging of breast: Secondary | ICD-10-CM

## 2015-03-09 DIAGNOSIS — N6002 Solitary cyst of left breast: Secondary | ICD-10-CM

## 2015-03-09 DIAGNOSIS — N6001 Solitary cyst of right breast: Secondary | ICD-10-CM

## 2015-03-13 ENCOUNTER — Ambulatory Visit
Admission: RE | Admit: 2015-03-13 | Discharge: 2015-03-13 | Disposition: A | Discharge status: Home / Self Care | Payer: BLUE CROSS/BLUE SHIELD | Source: Ambulatory Visit | Attending: Obstetrics | Admitting: Obstetrics

## 2015-03-13 ENCOUNTER — Other Ambulatory Visit: Payer: Self-pay | Admitting: Obstetrics

## 2015-03-13 DIAGNOSIS — N6310 Unspecified lump in the right breast, unspecified quadrant: Secondary | ICD-10-CM

## 2015-03-13 DIAGNOSIS — N6002 Solitary cyst of left breast: Secondary | ICD-10-CM

## 2015-03-13 DIAGNOSIS — N6001 Solitary cyst of right breast: Secondary | ICD-10-CM

## 2015-03-13 IMAGING — US US ASPIRATION
1 series · 2 of 2 positions shown · non-contrast
Comparison: Previous exams.

CLINICAL DATA: 54-year-old female presenting for ultrasound-guided
biopsy of a right breast mass and aspiration of single bilateral
cysts. Upon presentation, the presumed solid mass appeared possibly
cystic with ultrasound, and therefore aspiration was performed.

EXAM:
ULTRASOUND GUIDED BILATERAL BREAST CYST ASPIRATION

[Series 1: advbreast · 2 of 2 slices shown]
[im 1/2]
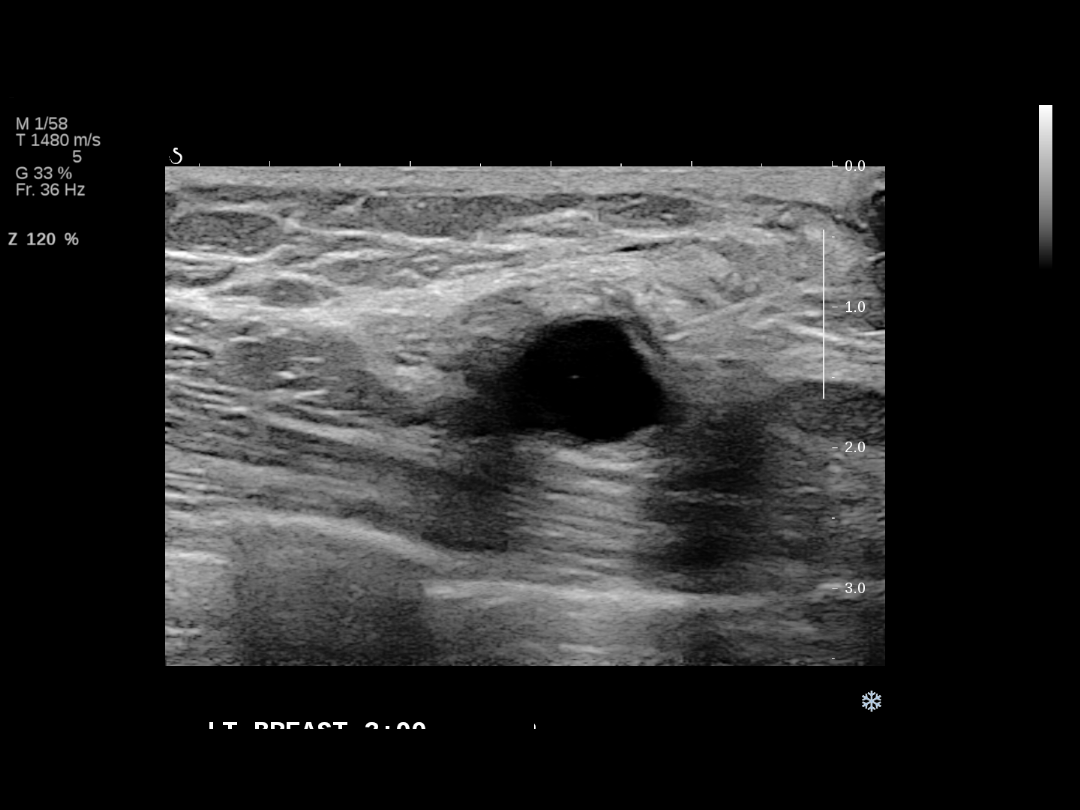
[im 2/2]
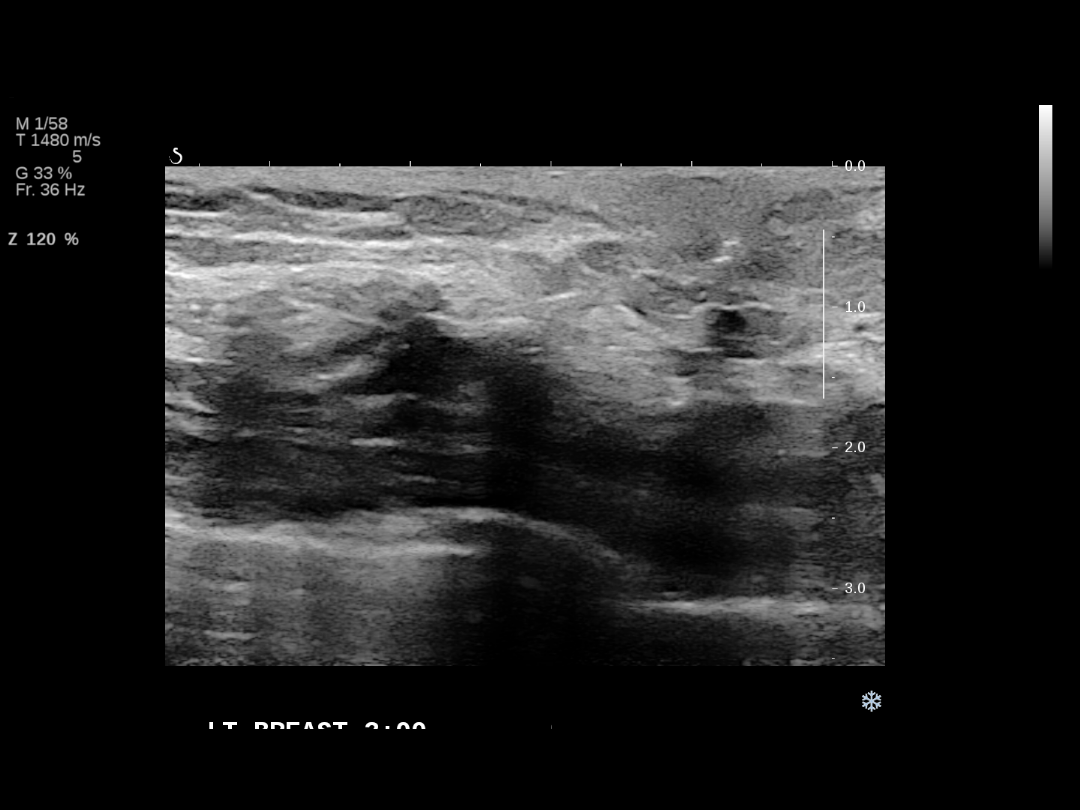

[2 of 2 positions shown; findings below may reference images not displayed]

PROCEDURE:
Using sterile technique, 1% lidocaine, under direct ultrasound
visualization, needle aspiration of a small cyst in the right breast
at 8 o'clock was performed. The cyst was seen to completely collapse
with aspiration. A second small adjacent cyst in the subareolar
right breast at 8 o'clock was also performed, which collapsed
completely with aspiration.

Next, using sterile technique and 1% lidocaine under direct
ultrasound visualization a needle aspiration of a tender cyst in the
left breast at 2 o'clock was performed. The cyst collapsed entirely
with aspiration.
IMPRESSION: Ultrasound-guided aspiration of bilateral breast cysts. No apparent
complications.

RECOMMENDATIONS:
Return to yearly screening mammography is recommended.

## 2015-11-12 DIAGNOSIS — M3210 Systemic lupus erythematosus, organ or system involvement unspecified: Secondary | ICD-10-CM | POA: Diagnosis not present

## 2015-11-12 DIAGNOSIS — I7300 Raynaud's syndrome without gangrene: Secondary | ICD-10-CM | POA: Diagnosis not present

## 2015-11-12 DIAGNOSIS — M545 Low back pain: Secondary | ICD-10-CM | POA: Diagnosis not present

## 2015-11-12 DIAGNOSIS — M150 Primary generalized (osteo)arthritis: Secondary | ICD-10-CM | POA: Diagnosis not present

## 2015-11-12 DIAGNOSIS — M359 Systemic involvement of connective tissue, unspecified: Secondary | ICD-10-CM | POA: Diagnosis not present

## 2015-12-31 DIAGNOSIS — M328 Other forms of systemic lupus erythematosus: Secondary | ICD-10-CM | POA: Diagnosis not present

## 2015-12-31 DIAGNOSIS — G43009 Migraine without aura, not intractable, without status migrainosus: Secondary | ICD-10-CM | POA: Diagnosis not present

## 2015-12-31 DIAGNOSIS — G47419 Narcolepsy without cataplexy: Secondary | ICD-10-CM | POA: Diagnosis not present

## 2016-01-29 DIAGNOSIS — H5702 Anisocoria: Secondary | ICD-10-CM | POA: Diagnosis not present

## 2016-01-29 DIAGNOSIS — G245 Blepharospasm: Secondary | ICD-10-CM | POA: Diagnosis not present

## 2016-01-31 DIAGNOSIS — H5702 Anisocoria: Secondary | ICD-10-CM | POA: Diagnosis not present

## 2016-02-04 DIAGNOSIS — Z0000 Encounter for general adult medical examination without abnormal findings: Secondary | ICD-10-CM | POA: Diagnosis not present

## 2016-02-04 DIAGNOSIS — H5702 Anisocoria: Secondary | ICD-10-CM | POA: Diagnosis not present

## 2016-02-04 DIAGNOSIS — Z79899 Other long term (current) drug therapy: Secondary | ICD-10-CM | POA: Diagnosis not present

## 2016-02-08 DIAGNOSIS — E559 Vitamin D deficiency, unspecified: Secondary | ICD-10-CM | POA: Diagnosis not present

## 2016-02-08 DIAGNOSIS — Z79899 Other long term (current) drug therapy: Secondary | ICD-10-CM | POA: Diagnosis not present

## 2016-02-08 DIAGNOSIS — Z23 Encounter for immunization: Secondary | ICD-10-CM | POA: Diagnosis not present

## 2016-02-08 DIAGNOSIS — E7800 Pure hypercholesterolemia, unspecified: Secondary | ICD-10-CM | POA: Diagnosis not present

## 2016-02-11 DIAGNOSIS — Z79899 Other long term (current) drug therapy: Secondary | ICD-10-CM | POA: Diagnosis not present

## 2016-02-11 DIAGNOSIS — H02431 Paralytic ptosis of right eyelid: Secondary | ICD-10-CM | POA: Diagnosis not present

## 2016-02-11 DIAGNOSIS — G245 Blepharospasm: Secondary | ICD-10-CM | POA: Diagnosis not present

## 2016-02-11 DIAGNOSIS — H5702 Anisocoria: Secondary | ICD-10-CM | POA: Diagnosis not present

## 2016-02-20 DIAGNOSIS — R946 Abnormal results of thyroid function studies: Secondary | ICD-10-CM | POA: Diagnosis not present

## 2016-02-29 ENCOUNTER — Other Ambulatory Visit: Payer: Self-pay | Admitting: Obstetrics

## 2016-02-29 DIAGNOSIS — Z1231 Encounter for screening mammogram for malignant neoplasm of breast: Secondary | ICD-10-CM

## 2016-03-12 ENCOUNTER — Ambulatory Visit
Admission: RE | Admit: 2016-03-12 | Discharge: 2016-03-12 | Disposition: A | Discharge status: Home / Self Care | Payer: BLUE CROSS/BLUE SHIELD | Source: Ambulatory Visit | Attending: Obstetrics | Admitting: Obstetrics

## 2016-03-12 ENCOUNTER — Other Ambulatory Visit: Payer: Self-pay | Admitting: Obstetrics

## 2016-03-12 DIAGNOSIS — N6320 Unspecified lump in the left breast, unspecified quadrant: Secondary | ICD-10-CM | POA: Diagnosis not present

## 2016-03-12 DIAGNOSIS — N63 Unspecified lump in unspecified breast: Secondary | ICD-10-CM

## 2016-03-12 DIAGNOSIS — Z1231 Encounter for screening mammogram for malignant neoplasm of breast: Secondary | ICD-10-CM

## 2016-03-12 DIAGNOSIS — R922 Inconclusive mammogram: Secondary | ICD-10-CM | POA: Diagnosis not present

## 2016-03-12 DIAGNOSIS — N6011 Diffuse cystic mastopathy of right breast: Secondary | ICD-10-CM | POA: Diagnosis not present

## 2016-03-25 DIAGNOSIS — G5782 Other specified mononeuropathies of left lower limb: Secondary | ICD-10-CM | POA: Diagnosis not present

## 2016-03-25 DIAGNOSIS — R200 Anesthesia of skin: Secondary | ICD-10-CM | POA: Diagnosis not present

## 2016-03-25 DIAGNOSIS — G5792 Unspecified mononeuropathy of left lower limb: Secondary | ICD-10-CM | POA: Diagnosis not present

## 2016-03-25 DIAGNOSIS — R208 Other disturbances of skin sensation: Secondary | ICD-10-CM | POA: Diagnosis not present

## 2016-03-25 DIAGNOSIS — G5762 Lesion of plantar nerve, left lower limb: Secondary | ICD-10-CM | POA: Diagnosis not present

## 2016-04-02 DIAGNOSIS — H5203 Hypermetropia, bilateral: Secondary | ICD-10-CM | POA: Diagnosis not present

## 2016-05-12 DIAGNOSIS — H5702 Anisocoria: Secondary | ICD-10-CM | POA: Diagnosis not present

## 2016-05-13 DIAGNOSIS — M359 Systemic involvement of connective tissue, unspecified: Secondary | ICD-10-CM | POA: Diagnosis not present

## 2016-05-13 DIAGNOSIS — M545 Low back pain: Secondary | ICD-10-CM | POA: Diagnosis not present

## 2016-05-13 DIAGNOSIS — Z1329 Encounter for screening for other suspected endocrine disorder: Secondary | ICD-10-CM | POA: Diagnosis not present

## 2016-05-13 DIAGNOSIS — I7300 Raynaud's syndrome without gangrene: Secondary | ICD-10-CM | POA: Diagnosis not present

## 2016-05-13 DIAGNOSIS — M150 Primary generalized (osteo)arthritis: Secondary | ICD-10-CM | POA: Diagnosis not present

## 2016-05-14 DIAGNOSIS — Z6821 Body mass index (BMI) 21.0-21.9, adult: Secondary | ICD-10-CM | POA: Diagnosis not present

## 2016-05-14 DIAGNOSIS — Z01419 Encounter for gynecological examination (general) (routine) without abnormal findings: Secondary | ICD-10-CM | POA: Diagnosis not present

## 2016-05-14 DIAGNOSIS — N898 Other specified noninflammatory disorders of vagina: Secondary | ICD-10-CM | POA: Diagnosis not present

## 2016-05-14 DIAGNOSIS — N760 Acute vaginitis: Secondary | ICD-10-CM | POA: Diagnosis not present

## 2016-06-10 DIAGNOSIS — J069 Acute upper respiratory infection, unspecified: Secondary | ICD-10-CM | POA: Diagnosis not present

## 2016-06-10 DIAGNOSIS — R6889 Other general symptoms and signs: Secondary | ICD-10-CM | POA: Diagnosis not present

## 2016-06-20 DIAGNOSIS — R091 Pleurisy: Secondary | ICD-10-CM | POA: Diagnosis not present

## 2016-07-14 DIAGNOSIS — G43009 Migraine without aura, not intractable, without status migrainosus: Secondary | ICD-10-CM | POA: Diagnosis not present

## 2016-07-14 DIAGNOSIS — G47419 Narcolepsy without cataplexy: Secondary | ICD-10-CM | POA: Diagnosis not present

## 2016-07-14 DIAGNOSIS — M328 Other forms of systemic lupus erythematosus: Secondary | ICD-10-CM | POA: Diagnosis not present

## 2016-07-28 DIAGNOSIS — G8929 Other chronic pain: Secondary | ICD-10-CM | POA: Diagnosis not present

## 2016-07-28 DIAGNOSIS — M79672 Pain in left foot: Secondary | ICD-10-CM | POA: Diagnosis not present

## 2016-07-28 DIAGNOSIS — G5782 Other specified mononeuropathies of left lower limb: Secondary | ICD-10-CM | POA: Diagnosis not present

## 2016-08-11 DIAGNOSIS — M79672 Pain in left foot: Secondary | ICD-10-CM | POA: Diagnosis not present

## 2016-08-11 DIAGNOSIS — G8929 Other chronic pain: Secondary | ICD-10-CM | POA: Diagnosis not present

## 2016-08-15 DIAGNOSIS — Z79899 Other long term (current) drug therapy: Secondary | ICD-10-CM | POA: Diagnosis not present

## 2016-08-20 DIAGNOSIS — M79672 Pain in left foot: Secondary | ICD-10-CM | POA: Diagnosis not present

## 2016-08-20 DIAGNOSIS — G8929 Other chronic pain: Secondary | ICD-10-CM | POA: Diagnosis not present

## 2016-08-20 DIAGNOSIS — M7742 Metatarsalgia, left foot: Secondary | ICD-10-CM | POA: Diagnosis not present

## 2016-08-20 DIAGNOSIS — G5782 Other specified mononeuropathies of left lower limb: Secondary | ICD-10-CM | POA: Diagnosis not present

## 2017-01-12 DIAGNOSIS — G43009 Migraine without aura, not intractable, without status migrainosus: Secondary | ICD-10-CM | POA: Diagnosis not present

## 2017-01-12 DIAGNOSIS — G47419 Narcolepsy without cataplexy: Secondary | ICD-10-CM | POA: Diagnosis not present

## 2017-02-03 DIAGNOSIS — M545 Low back pain: Secondary | ICD-10-CM | POA: Diagnosis not present

## 2017-02-03 DIAGNOSIS — I7300 Raynaud's syndrome without gangrene: Secondary | ICD-10-CM | POA: Diagnosis not present

## 2017-02-03 DIAGNOSIS — M359 Systemic involvement of connective tissue, unspecified: Secondary | ICD-10-CM | POA: Diagnosis not present

## 2017-02-03 DIAGNOSIS — M150 Primary generalized (osteo)arthritis: Secondary | ICD-10-CM | POA: Diagnosis not present

## 2017-02-09 DIAGNOSIS — K130 Diseases of lips: Secondary | ICD-10-CM | POA: Diagnosis not present

## 2017-02-09 DIAGNOSIS — N200 Calculus of kidney: Secondary | ICD-10-CM | POA: Diagnosis not present

## 2017-02-09 DIAGNOSIS — Z23 Encounter for immunization: Secondary | ICD-10-CM | POA: Diagnosis not present

## 2017-02-09 DIAGNOSIS — M25579 Pain in unspecified ankle and joints of unspecified foot: Secondary | ICD-10-CM | POA: Diagnosis not present

## 2017-03-05 DIAGNOSIS — G5762 Lesion of plantar nerve, left lower limb: Secondary | ICD-10-CM | POA: Diagnosis not present

## 2017-03-05 DIAGNOSIS — L84 Corns and callosities: Secondary | ICD-10-CM | POA: Diagnosis not present

## 2017-03-05 DIAGNOSIS — M7742 Metatarsalgia, left foot: Secondary | ICD-10-CM | POA: Diagnosis not present

## 2017-03-11 ENCOUNTER — Other Ambulatory Visit: Payer: Self-pay | Admitting: Obstetrics

## 2017-03-11 DIAGNOSIS — Z1231 Encounter for screening mammogram for malignant neoplasm of breast: Secondary | ICD-10-CM

## 2017-03-25 ENCOUNTER — Ambulatory Visit
Admission: RE | Admit: 2017-03-25 | Discharge: 2017-03-25 | Disposition: A | Discharge status: Home / Self Care | Payer: BLUE CROSS/BLUE SHIELD | Source: Ambulatory Visit | Attending: Obstetrics | Admitting: Obstetrics

## 2017-03-25 DIAGNOSIS — Z1231 Encounter for screening mammogram for malignant neoplasm of breast: Secondary | ICD-10-CM

## 2017-03-25 IMAGING — MG 2D DIGITAL SCREENING BILATERAL MAMMOGRAM WITH CAD AND ADJUNCT TO
9 of 12 series · 9 of 28 positions shown · non-contrast
Comparison: Previous exam(s).

CLINICAL DATA: Screening.

EXAM:
2D DIGITAL SCREENING BILATERAL MAMMOGRAM WITH CAD AND ADJUNCT TOMO

[L MLO synth-2D]
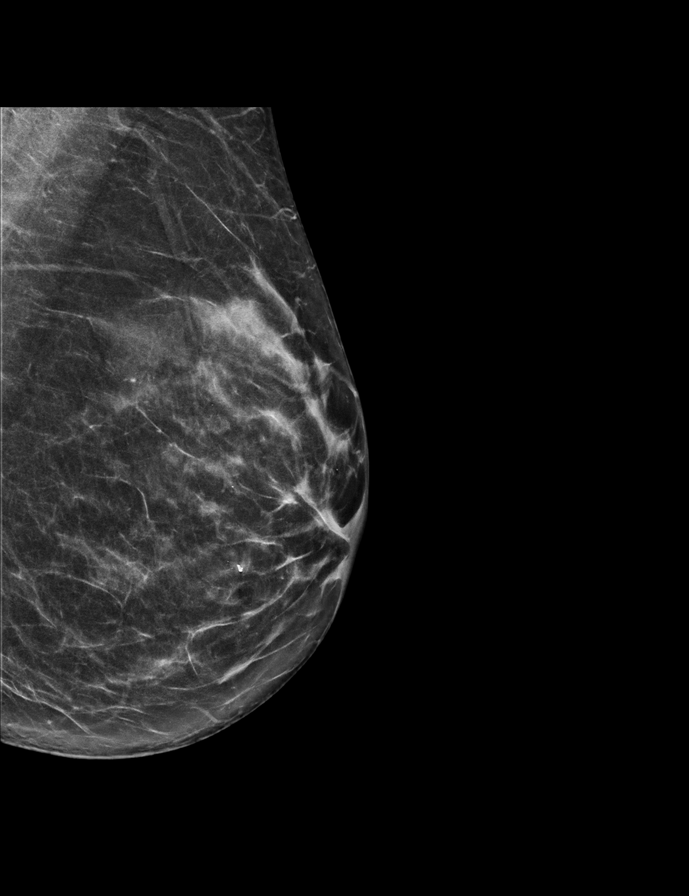

[R CC]
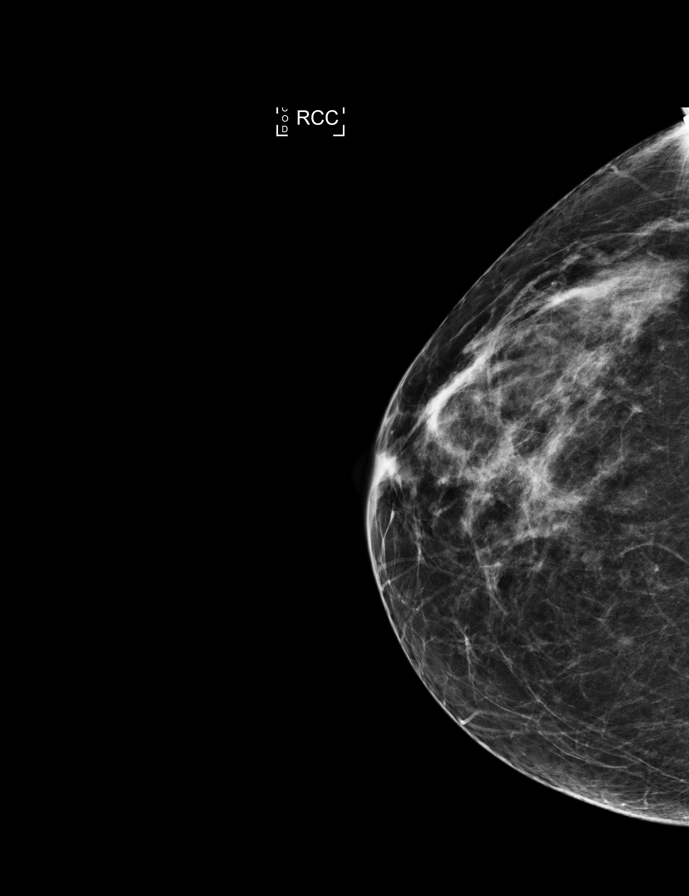

[R CC synth-2D]
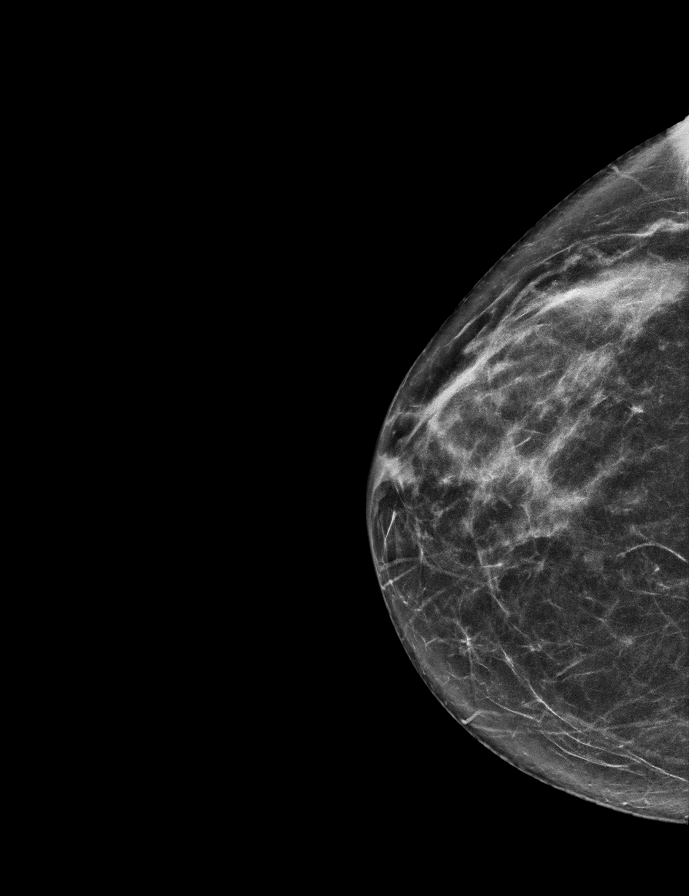

[R MLO synth-2D]
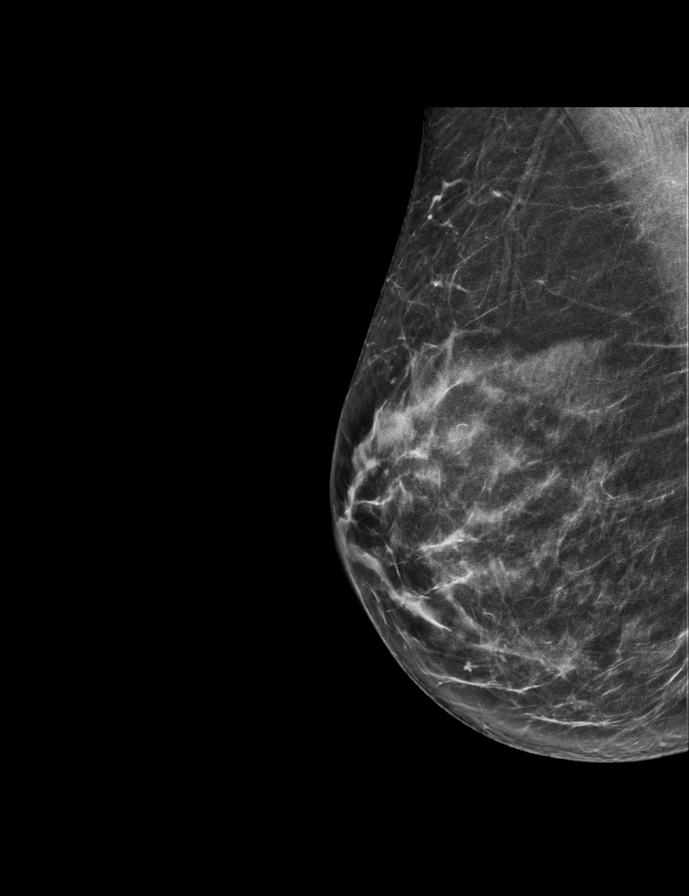

[R MLO]
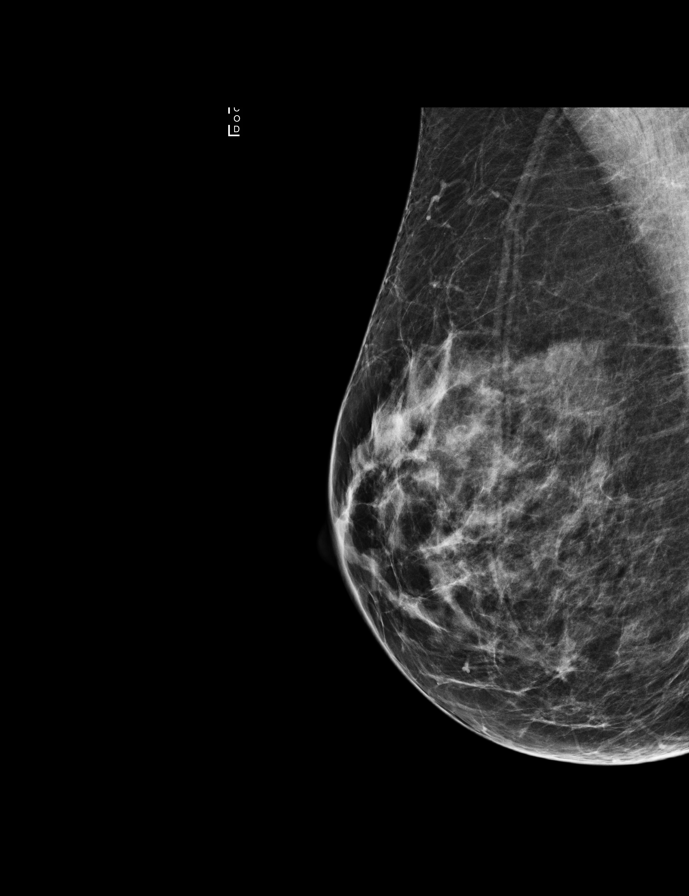

[L CC synth-2D]
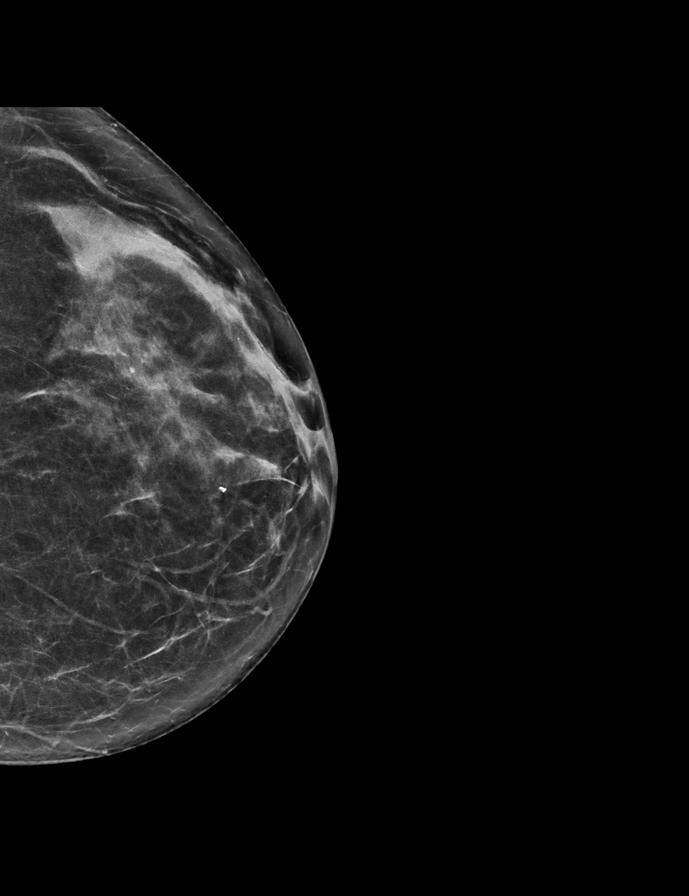

[L MLO]
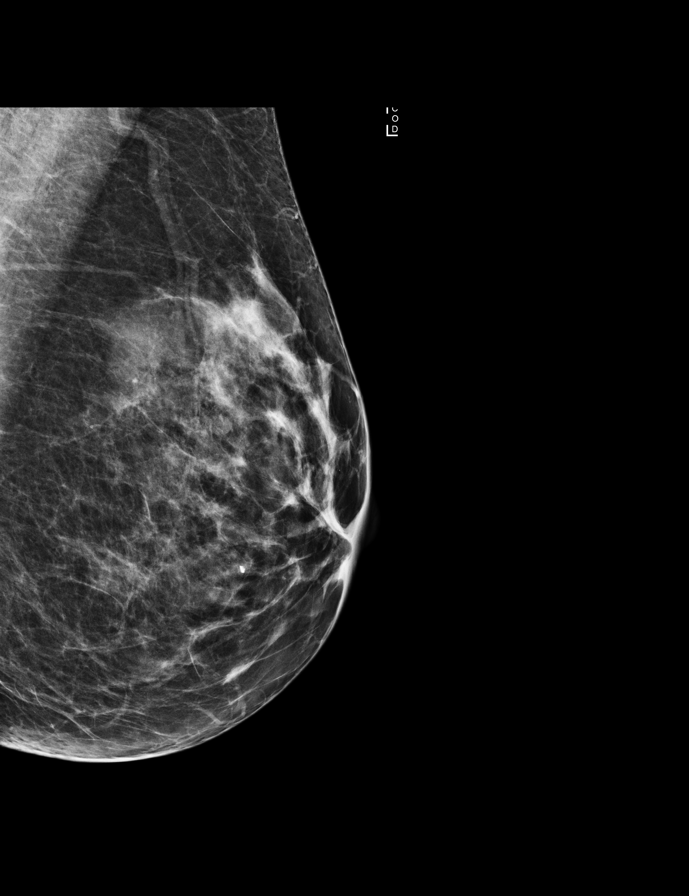

[L CC]
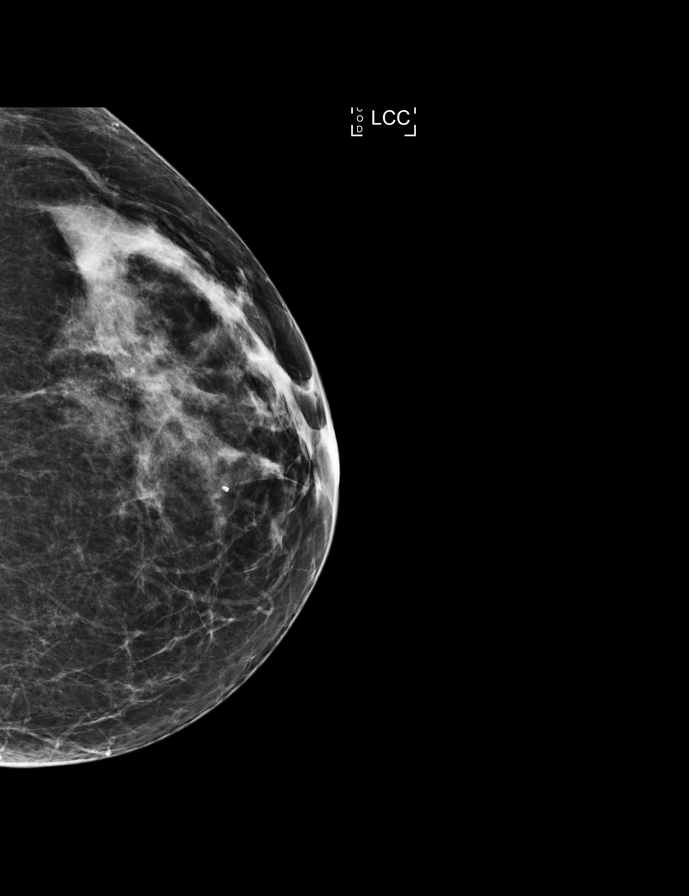

[L CC tomo · tomo slice 31/60.0]
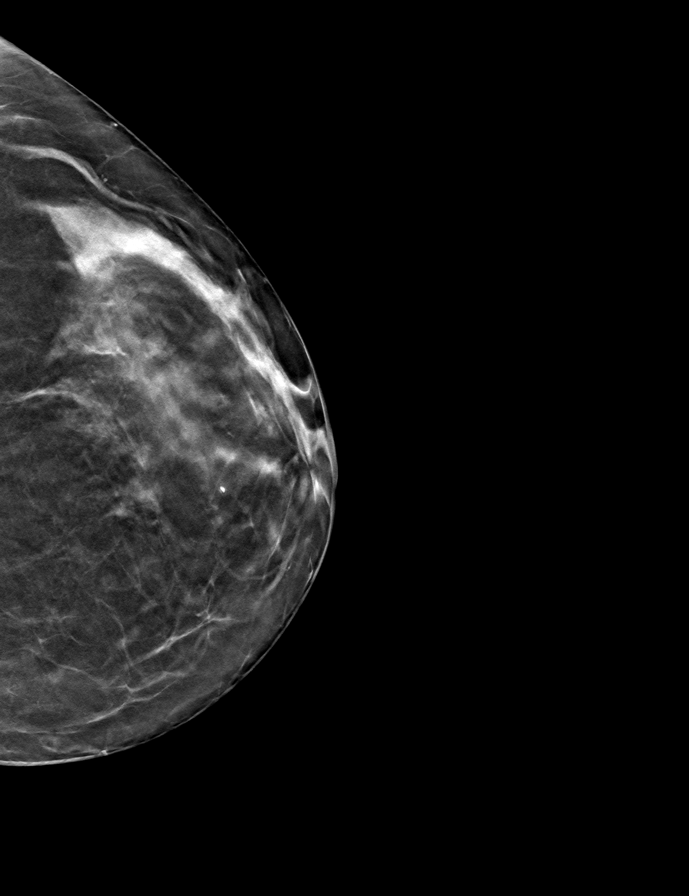

[9 of 28 positions shown; findings below may reference images not displayed]

ACR Breast Density Category c: The breast tissue is heterogeneously
dense, which may obscure small masses.
FINDINGS: There are no findings suspicious for malignancy. Images were
processed with CAD.
IMPRESSION: No mammographic evidence of malignancy. A result letter of this
screening mammogram will be mailed directly to the patient.

RECOMMENDATION:
Screening mammogram in one year. (Code:[TA])

BI-RADS CATEGORY  1: Negative.

## 2017-04-03 DIAGNOSIS — B349 Viral infection, unspecified: Secondary | ICD-10-CM | POA: Diagnosis not present

## 2017-04-21 DIAGNOSIS — H5203 Hypermetropia, bilateral: Secondary | ICD-10-CM | POA: Diagnosis not present

## 2017-05-04 ENCOUNTER — Other Ambulatory Visit: Payer: Self-pay

## 2017-05-04 ENCOUNTER — Inpatient Hospital Stay (HOSPITAL_COMMUNITY)
Admission: AD | Admit: 2017-05-04 | Discharge: 2017-05-04 | Disposition: A | Discharge status: Home / Self Care | Payer: BLUE CROSS/BLUE SHIELD | Source: Ambulatory Visit | Attending: Obstetrics | Admitting: Obstetrics

## 2017-05-04 ENCOUNTER — Inpatient Hospital Stay (HOSPITAL_COMMUNITY): Payer: BLUE CROSS/BLUE SHIELD

## 2017-05-04 DIAGNOSIS — N1330 Unspecified hydronephrosis: Secondary | ICD-10-CM

## 2017-05-04 DIAGNOSIS — B9689 Other specified bacterial agents as the cause of diseases classified elsewhere: Secondary | ICD-10-CM | POA: Insufficient documentation

## 2017-05-04 DIAGNOSIS — Z880 Allergy status to penicillin: Secondary | ICD-10-CM | POA: Diagnosis not present

## 2017-05-04 DIAGNOSIS — N760 Acute vaginitis: Secondary | ICD-10-CM

## 2017-05-04 DIAGNOSIS — Z87442 Personal history of urinary calculi: Secondary | ICD-10-CM | POA: Diagnosis not present

## 2017-05-04 DIAGNOSIS — R109 Unspecified abdominal pain: Secondary | ICD-10-CM | POA: Diagnosis present

## 2017-05-04 DIAGNOSIS — Z7982 Long term (current) use of aspirin: Secondary | ICD-10-CM | POA: Insufficient documentation

## 2017-05-04 DIAGNOSIS — N200 Calculus of kidney: Secondary | ICD-10-CM | POA: Diagnosis not present

## 2017-05-04 DIAGNOSIS — N132 Hydronephrosis with renal and ureteral calculous obstruction: Secondary | ICD-10-CM | POA: Diagnosis not present

## 2017-05-04 LAB — CBC
HCT: 38.8 % (ref 36.0–46.0)
Hemoglobin: 12.6 g/dL (ref 12.0–15.0)
MCH: 31.4 pg (ref 26.0–34.0)
MCHC: 32.5 g/dL (ref 30.0–36.0)
MCV: 96.8 fL (ref 78.0–100.0)
Platelets: 151 10*3/uL (ref 150–400)
RBC: 4.01 MIL/uL (ref 3.87–5.11)
RDW: 12 % (ref 11.5–15.5)
WBC: 2.6 10*3/uL — ABNORMAL LOW (ref 4.0–10.5)

## 2017-05-04 LAB — COMPREHENSIVE METABOLIC PANEL
ALT: 15 U/L (ref 14–54)
AST: 20 U/L (ref 15–41)
Albumin: 3.5 g/dL (ref 3.5–5.0)
Alkaline Phosphatase: 54 U/L (ref 38–126)
Anion gap: 8 (ref 5–15)
BUN: 16 mg/dL (ref 6–20)
CO2: 21 mmol/L — ABNORMAL LOW (ref 22–32)
Calcium: 8.6 mg/dL — ABNORMAL LOW (ref 8.9–10.3)
Chloride: 111 mmol/L (ref 101–111)
Creatinine, Ser: 0.85 mg/dL (ref 0.44–1.00)
GFR calc Af Amer: >60 mL/min (ref >60)
GFR calc non Af Amer: >60 mL/min (ref >60)
Glucose, Bld: 84 mg/dL (ref 65–99)
Potassium: 3.9 mmol/L (ref 3.5–5.1)
Sodium: 140 mmol/L (ref 135–145)
Total Bilirubin: 0.7 mg/dL (ref 0.3–1.2)
Total Protein: 6 g/dL — ABNORMAL LOW (ref 6.5–8.1)

## 2017-05-04 LAB — URINALYSIS, ROUTINE W REFLEX MICROSCOPIC
Bilirubin Urine: NEGATIVE
Glucose, UA: NEGATIVE mg/dL
Hgb urine dipstick: NEGATIVE
Ketones, ur: NEGATIVE mg/dL
Leukocytes, UA: NEGATIVE
Nitrite: NEGATIVE
Protein, ur: NEGATIVE mg/dL
Specific Gravity, Urine: 1.013 (ref 1.005–1.030)
pH: 7 (ref 5.0–8.0)

## 2017-05-04 LAB — WET PREP, GENITAL
Sperm: NONE SEEN
Trich, Wet Prep: NONE SEEN
Yeast Wet Prep HPF POC: NONE SEEN

## 2017-05-04 IMAGING — CR DG ABDOMEN 1V
2 series · 2 of 2 positions shown · non-contrast
Comparison: [DATE]

CLINICAL DATA: Nephrolithiasis.

EXAM:
ABDOMEN - 1 VIEW

[abdomen kub (1 of 2)]
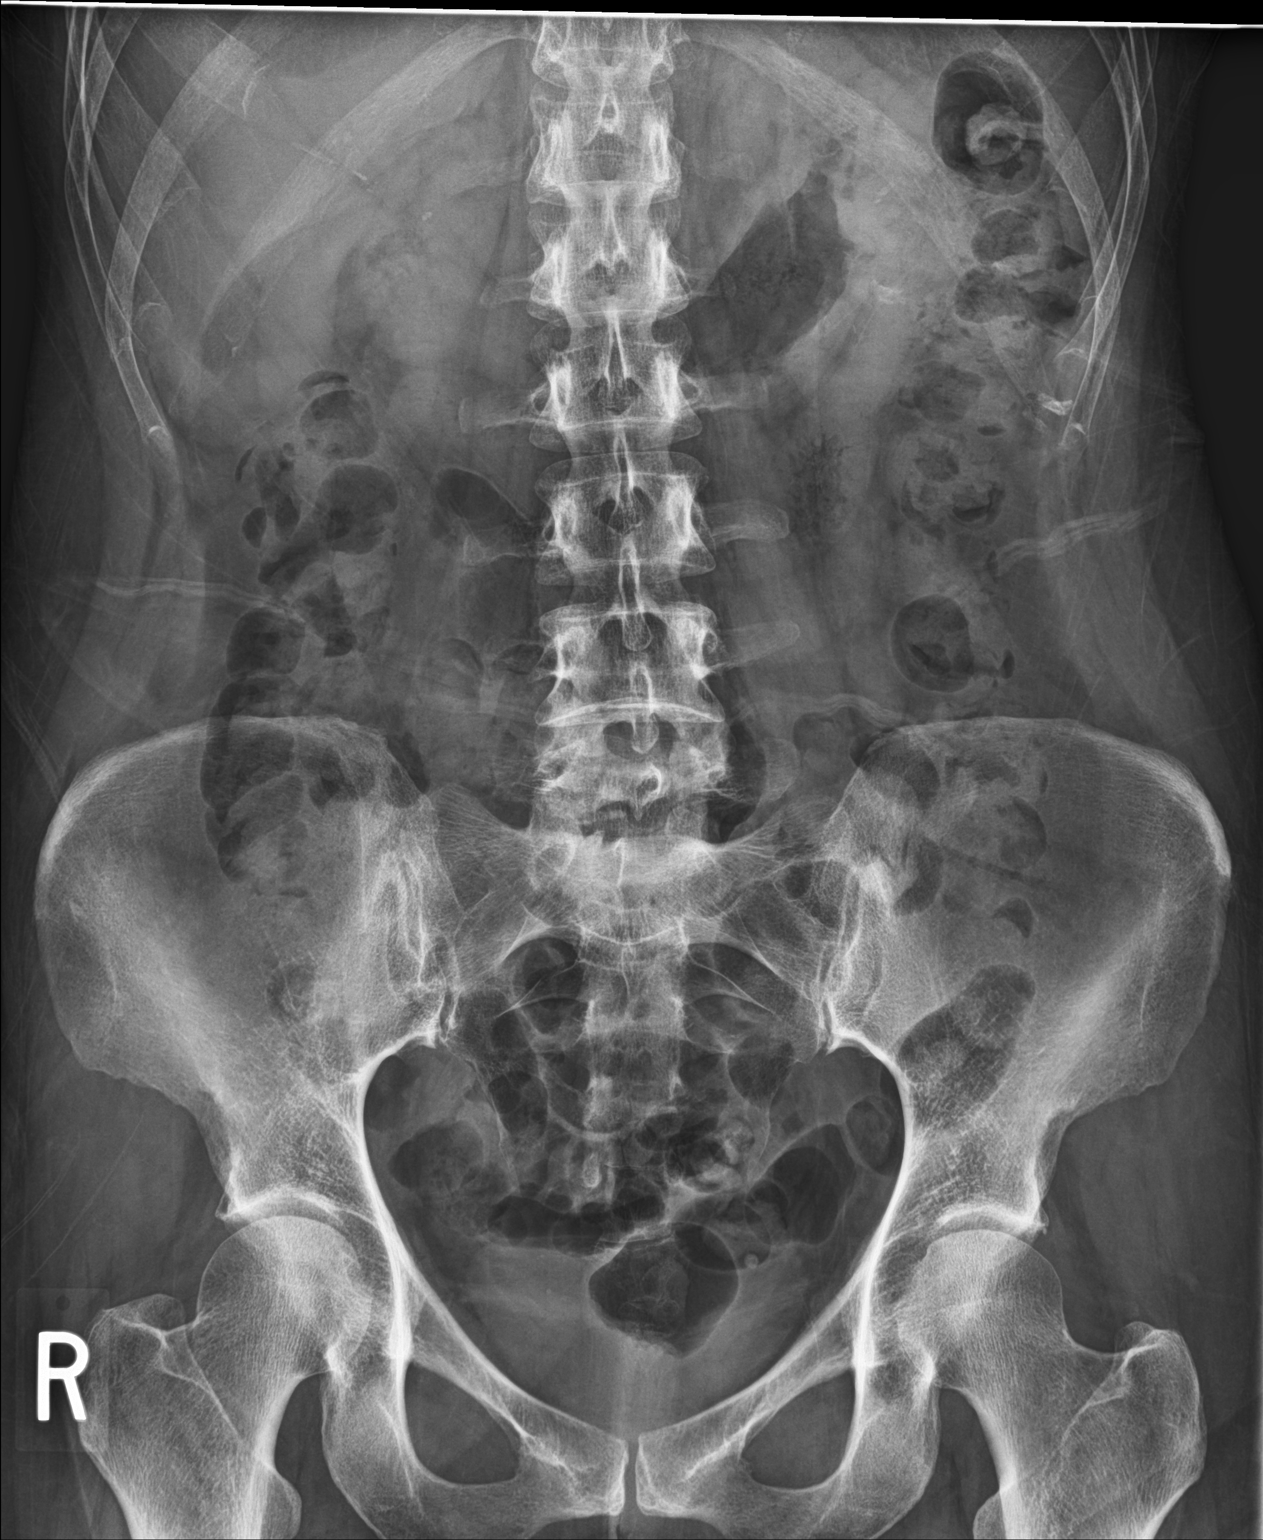

[abdomen kub (2 of 2)]
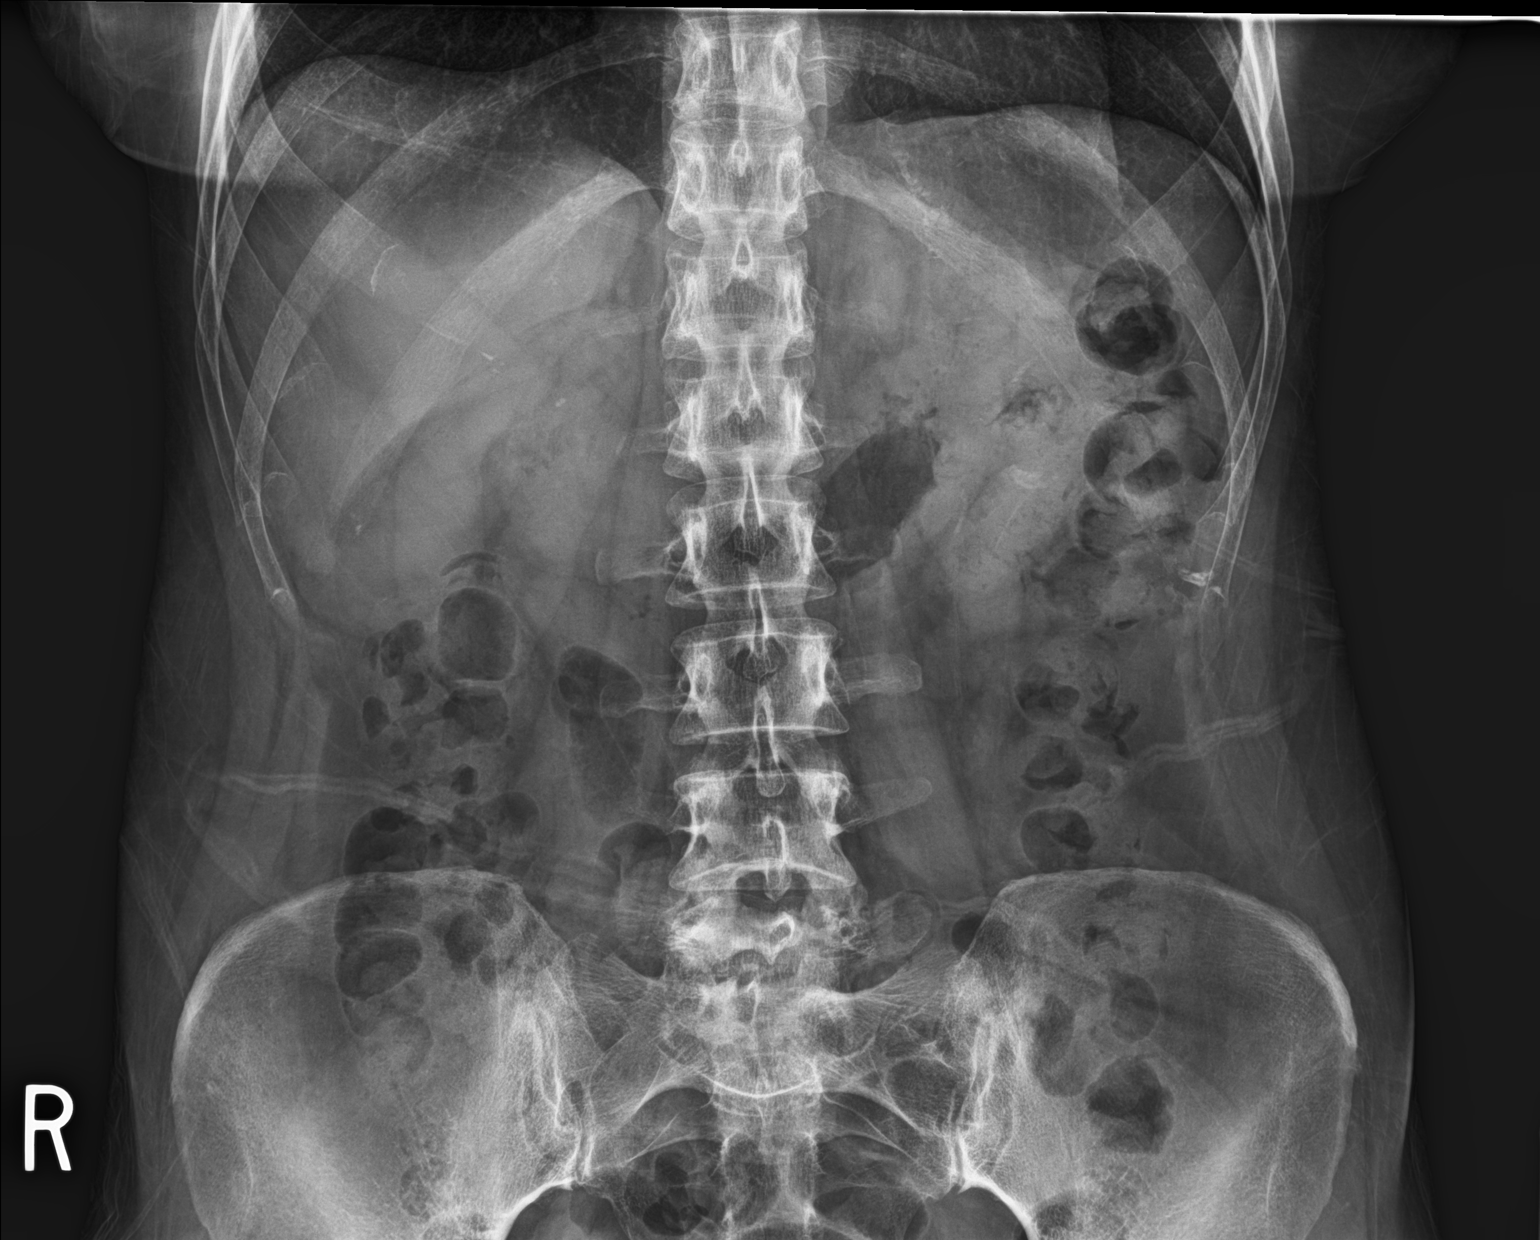

[2 of 2 positions shown; findings below may reference images not displayed]

FINDINGS: The bowel gas pattern is normal. Stone within the upper pole of the
right kidney measures 4 mm. Unchanged from previous exam. Within the
projection of the inferior pole of the left kidney there is a 9 mm
calcification. No calcifications along the course of the ureters
identified.
IMPRESSION: 1. Bilateral nephrolithiasis suspected. No stones identified along
the course of the ureters.

## 2017-05-04 IMAGING — US US RENAL
1 series · 15 of 25 positions shown · non-contrast
Comparison: CT [DATE]

CLINICAL DATA: Left flank pain.  History of nephrolithiasis.

EXAM:
RENAL / URINARY TRACT ULTRASOUND COMPLETE

[Series 1: us renal · 15 of 61 slices shown]
[im 1/61]
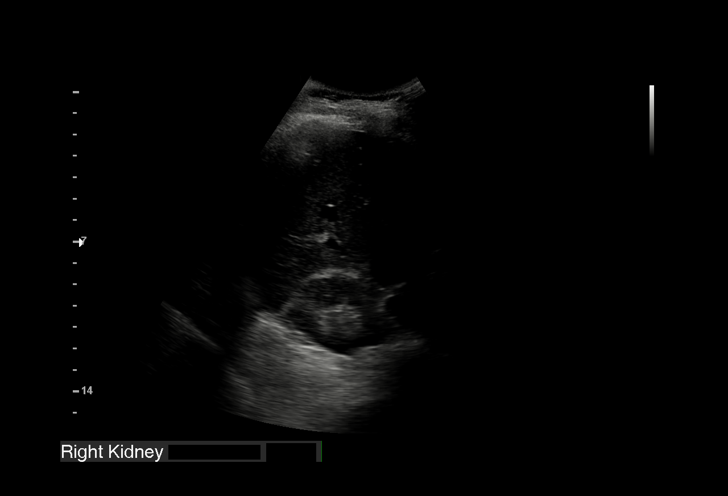
[im 6/61]
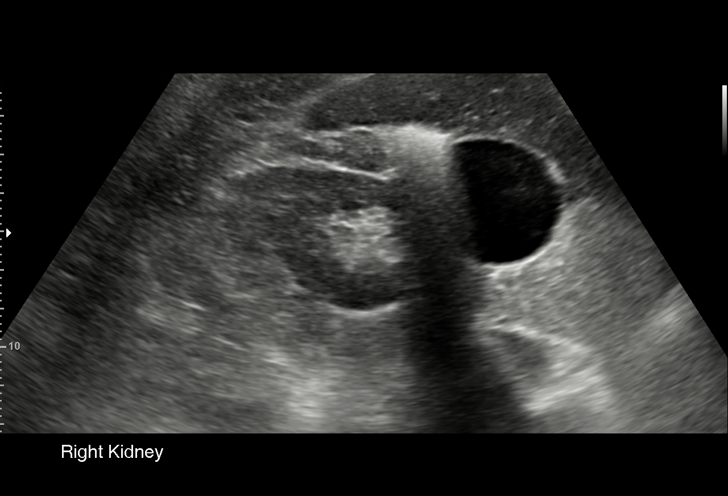
[im 11/61]
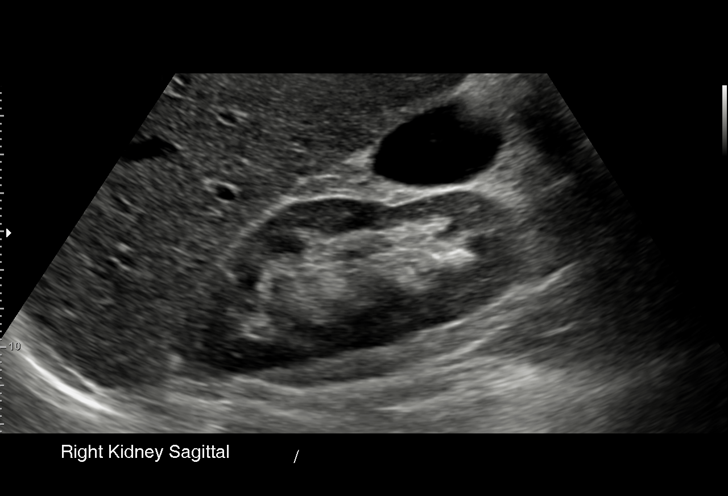
[im 13/61]
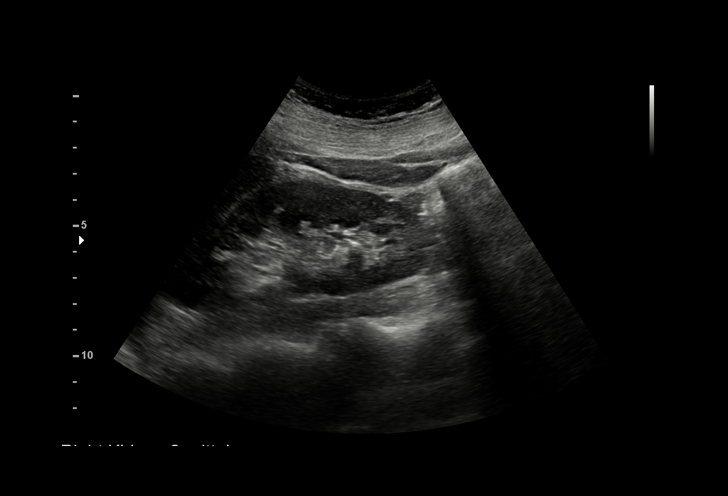
[im 18/61]
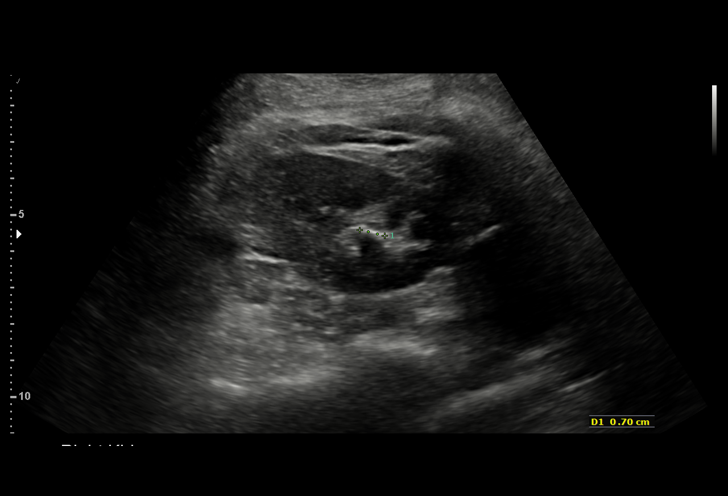
[im 23/61]
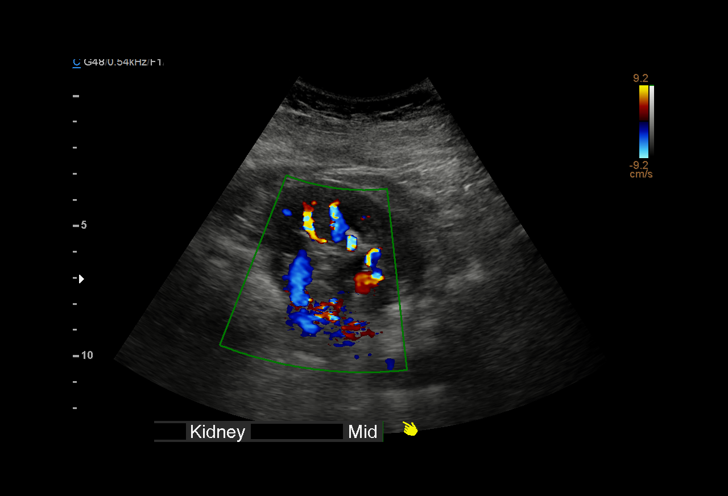
[im 26/61]
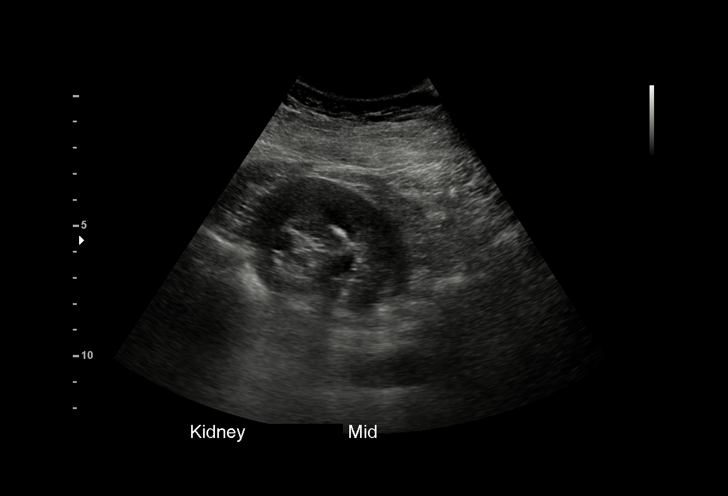
[im 31/61]
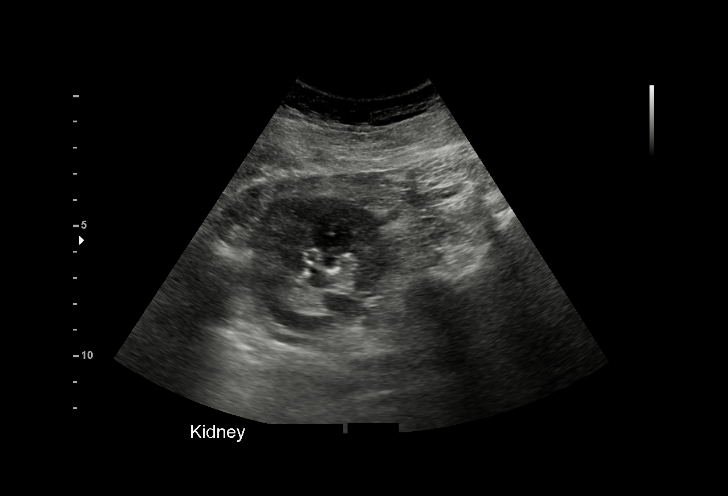
[im 36/61]
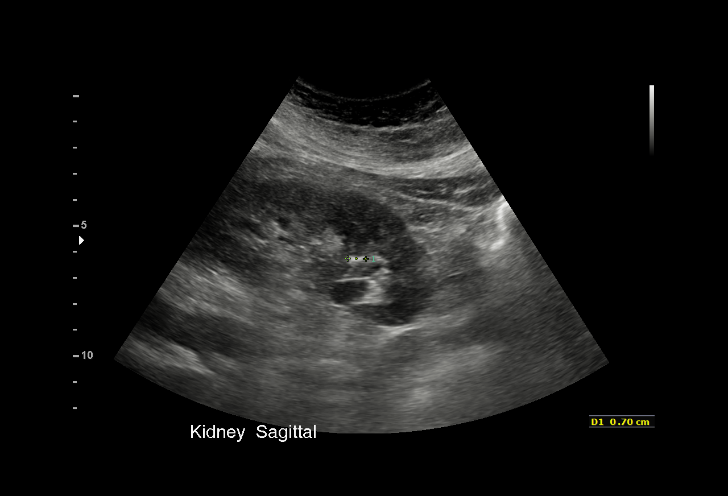
[im 38/61]
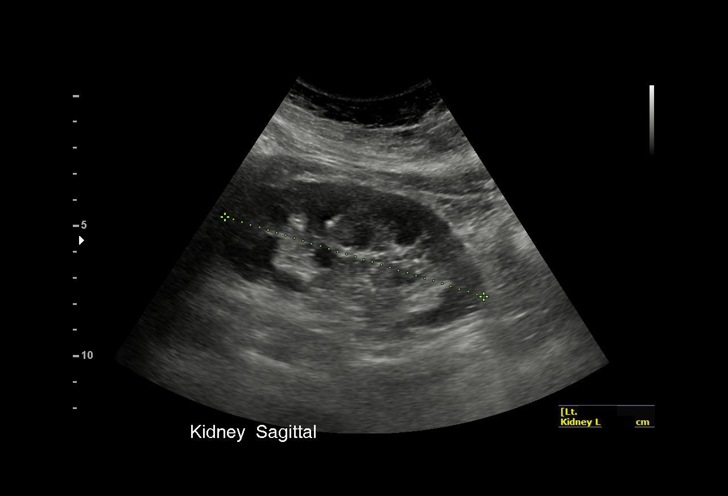
[im 43/61]
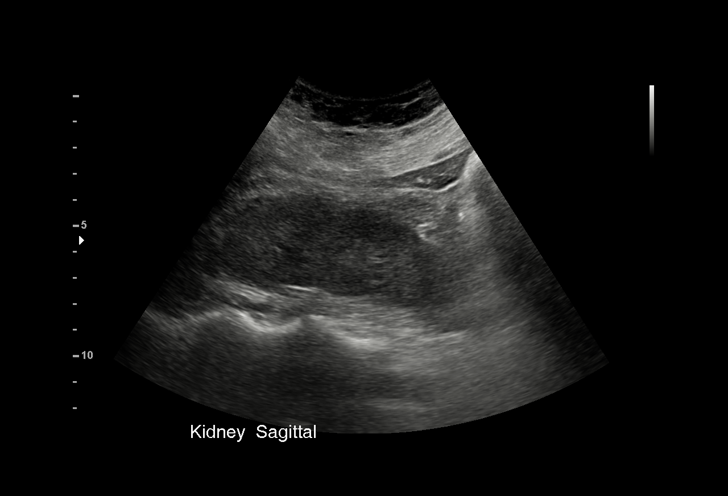
[im 48/61]
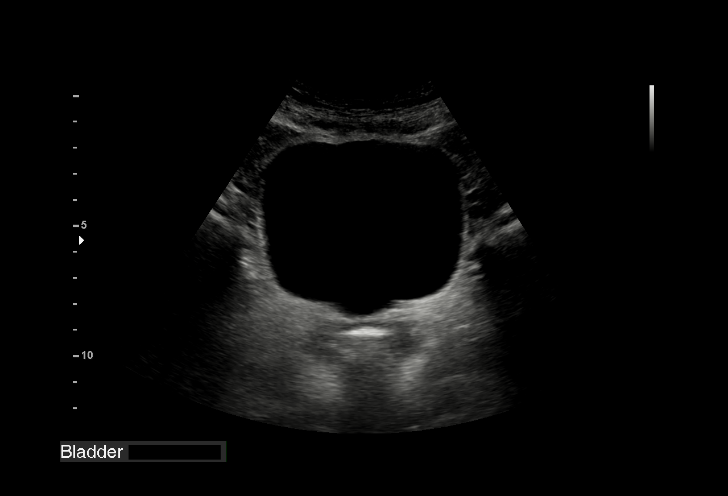
[im 51/61]
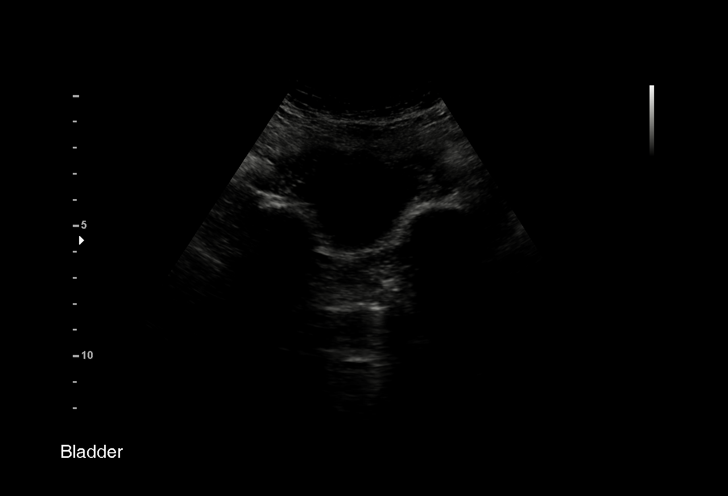
[im 56/61]
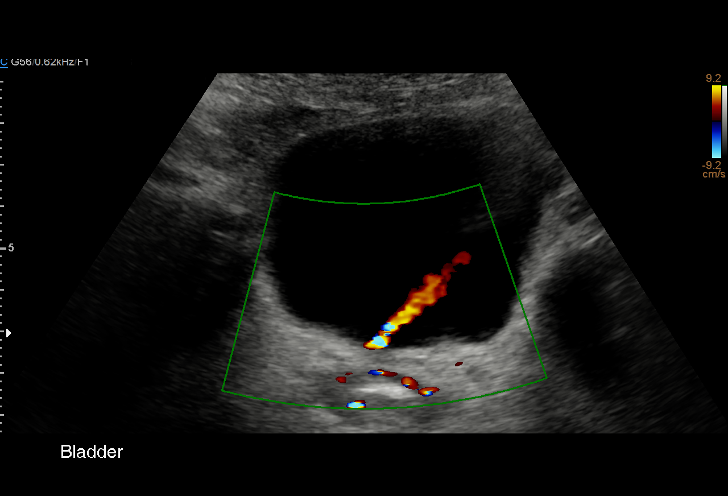
[im 61/61]
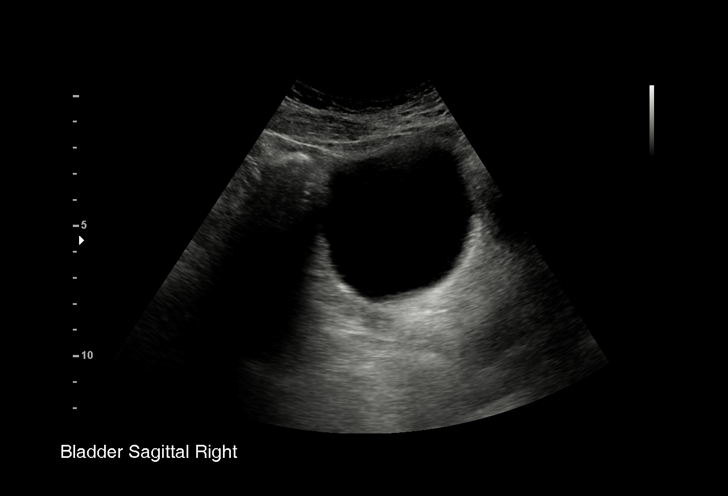

[15 of 25 positions shown; findings below may reference images not displayed]

FINDINGS: Right Kidney:

Length: 10.4 cm. Echogenicity within normal limits. No mass or
hydronephrosis visualized. 0.7 cm echogenic structure in the lower
pole is most compatible with a stone.

Left Kidney:

Length: 10.4 cm. Echogenicity within normal limits. Three 3
echogenic foci in the left kidney are compatible with stones.
Largest left kidney stone measures 1.1 cm in the lower pole region.
Mild left hydronephrosis. There are probably parapelvic cysts based
on the previous CT.

Bladder:

Appears normal for degree of bladder distention. There is a right
ureter jet. No evidence for a left ureter jet.
IMPRESSION: Mild left hydronephrosis with left renal calculi. In addition, left
ureter jet is not visualized. Findings raise concern for a left
ureter calculus.

Right renal calculus without right hydronephrosis.

## 2017-05-04 MED ORDER — METRONIDAZOLE 500 MG PO TABS: 500.0000 mg | ORAL_TABLET | Freq: Two times a day (BID) | ORAL | 0 refills | Status: AC

## 2017-05-04 MED ORDER — TAMSULOSIN HCL 0.4 MG PO CAPS
0.4000 mg | ORAL_CAPSULE | Freq: Once | ORAL | Status: AC
  Administered 2017-05-04: 0.4 mg via ORAL
  Filled 2017-05-04: qty 1

## 2017-05-04 MED ORDER — OXYCODONE-ACETAMINOPHEN 5-325 MG PO TABS
2.0000 | ORAL_TABLET | Freq: Once | ORAL | Status: AC
  Administered 2017-05-04: 2 via ORAL
  Filled 2017-05-04: qty 2

## 2017-05-04 MED ORDER — MORPHINE SULFATE (PF) 4 MG/ML IV SOLN
2.0000 mg | Freq: Once | INTRAVENOUS | Status: AC
  Administered 2017-05-04: 2 mg via INTRAVENOUS
  Filled 2017-05-04: qty 1

## 2017-05-04 MED ORDER — OXYCODONE-ACETAMINOPHEN 5-325 MG PO TABS: 1.0000 | ORAL_TABLET | Freq: Three times a day (TID) | ORAL | 0 refills | Status: DC | PRN

## 2017-05-04 MED ORDER — IBUPROFEN 600 MG PO TABS: 600.0000 mg | ORAL_TABLET | Freq: Three times a day (TID) | ORAL | 0 refills | Status: DC

## 2017-05-04 MED ORDER — KETOROLAC TROMETHAMINE 30 MG/ML IJ SOLN
30.0000 mg | Freq: Once | INTRAMUSCULAR | Status: AC
  Administered 2017-05-04: 30 mg via INTRAMUSCULAR
  Filled 2017-05-04: qty 1

## 2017-05-04 MED ORDER — TAMSULOSIN HCL 0.4 MG PO CAPS: 0.4000 mg | ORAL_CAPSULE | Freq: Every day | ORAL | 0 refills | Status: DC

## 2017-05-04 MED ORDER — LACTATED RINGERS IV SOLN
INTRAVENOUS | Status: DC
  Administered 2017-05-04: 15:00:00 via INTRAVENOUS

## 2017-05-04 NOTE — Discharge Instructions (Signed)

## 2017-05-04 NOTE — MAU Note (Signed)
Pr presents with c/o abdominal pain from umbilicus down that began approximately 3 weeks ago.  Pt also reports vaginal odor without discharge or itching.  Pt s/p hysterectomy 24 years ago.

## 2017-05-04 NOTE — MAU Provider Note (Signed)
History  CSN: [id number] Arrival date and time: 05/04/17 [id number]  First Provider Initiated Contact with Patient 05/04/17 1038      Chief Complaint  Patient presents with  . Abdominal Pain    HPI: Cindy Fitzgerald is a 56 y.o. postmenopausal female who come in for abdominal pain. She reports pain started a couple of weeks ago, but got worse the last 2 days, had been intermittent, but constant this morning. Pain starts in umbilical region and radiate down her LLQ, and left flank. Also has some right-sided low back pain. She endorses come intermittent nausea, but denies any vomiting. Also denies any fever, dysuria, hematuria and urinary frequency, but reports feeling some suprapubic pain when she urinates. Denies vaginal discharge or vaginal bleeding, but reports having some vaginal odor. She is s/p hysterectomy about 24 years ago. She reports significant history of kidney stones and she has a urologist she follows up with, Dr. Vernie Ammonsttelin.   Past Medical History:  Diagnosis Date  . Arthritis   . Hypersomnolence   . Kidney stones   . Lupus   . Lupus   . Migraine   . Migraines   . Narcolepsy   . Osteopenia   . Raynaud's disease   . Sleep apnea    no cpap  . Vitamin deficiency    low d   Past Surgical History:  Procedure Laterality Date  . ABDOMINAL HYSTERECTOMY     Social History   Socioeconomic History  . Marital status: Married    Spouse name: Not on file  . Number of children: Not on file  . Years of education: Not on file  . Highest education level: Not on file  Social Needs  . Financial resource strain: Not on file  . Food insecurity - worry: Not on file  . Food insecurity - inability: Not on file  . Transportation needs - medical: Not on file  . Transportation needs - non-medical: Not on file  Occupational History  . Not on file  Tobacco Use  . Smoking status: Never Smoker  . Smokeless tobacco: Never Used  Substance and Sexual Activity  . Alcohol use: No  . Drug  use: No  . Sexual activity: Not on file  Other Topics Concern  . Not on file  Social History Narrative  . Not on file   Allergies  Allergen Reactions  . Adhesive [Tape]     Rash  . Codeine     GI upset  . Other     Steri-strips  . Terbinafine Hcl Swelling  . Latex Rash  . Penicillins Cross Reactors Rash    Medications Prior to Admission  Medication Sig Dispense Refill Last Dose  . aspirin 81 MG tablet Take 81 mg by mouth daily.     Taking  . Calcium Carbonate-Vit D-Min (CALCIUM 1200 PO) Take 1,500 mg by mouth daily.   Taking  . cetirizine (ZYRTEC) 10 MG tablet Take 10 mg by mouth daily.   Taking  . cholecalciferol (VITAMIN D) 1000 UNITS tablet Take 1,000 Units by mouth daily.     Taking  . esomeprazole (NEXIUM) 20 MG capsule Take 20 mg by mouth daily at 12 noon.   Taking  . estradiol (VIVELLE-DOT) 0.0375 MG/24HR Place 1 patch onto the skin 2 (two) times a week.   Taking  . hydroxychloroquine (PLAQUENIL) 200 MG tablet Take 100 mg by mouth daily.     Taking  . ibuprofen (ADVIL,MOTRIN) 100 MG chewable tablet Chew 400 mg by mouth  every 8 (eight) hours as needed.   Taking  . Lactobacillus Rhamnosus, GG, (CULTURELLE PO) Take by mouth.   Taking  . magnesium oxide (MAG-OX) 400 MG tablet Take 400 mg by mouth daily.     Taking  . meloxicam (MOBIC) 15 MG tablet Take 15 mg by mouth daily.   Taking  . modafinil (PROVIGIL) 100 MG tablet Take 2 tablets (200 mg total) by mouth daily. 60 tablet 3 Taking  . Multiple Vitamin (MULTI VITAMIN DAILY PO) Take by mouth.   Taking  . ranitidine (ZANTAC) 300 MG tablet Take 300 mg by mouth at bedtime.   Taking  . rizatriptan (MAXALT-MLT) 10 MG disintegrating tablet Take 10 mg by mouth as needed. May repeat in 2 hours if needed    Taking  . zonisamide (ZONEGRAN) 100 MG capsule Take 400 mg by mouth daily.    Taking    I have reviewed patient's Past Medical Hx, Surgical Hx, Family Hx, Social Hx, medications and allergies.   Review of Systems: Negative  except for what is mentioned in HPI.  Physical Exam   Blood pressure 132/63, pulse 64, temperature 98.5 F (36.9 C), temperature source Oral, resp. rate 20, SpO2 100 %.  Constitutional: Well-developed, well-nourished female in no acute distress.  HENT: [place]/AT, normal oropharynx mucosa. MMM Eyes: normal conjunctivae, no scleral icterus Cardiovascular: normal rate, regular rhythm Respiratory: normal effort, lungs CTAB.  GI: Abd soft, diffuse TTP tender, non-distended, no rebound or guarding; normoactive bowel sounds GU: + Left CVAT MSK: Extremities nontender, no edema. Right lumbar paraspinal muscle tenderness to palpation.  Neurologic: Alert and oriented x 4. Psych: Normal mood and affect Skin: warm and dry  MAU Course/MDM:   Nursing notes and VS reviewed. Patient seen and examined, as noted above.  Exam notable for L flank pain. UA, CBC, CMP ordered.  Toradol given for pain.  Wet prep done given c/o vaginal odor.  Results reviewed:  Results for orders placed or performed during the hospital encounter of 05/04/17  Wet prep, genital  Result Value Ref Range   Yeast Wet Prep HPF POC NONE SEEN NONE SEEN   Trich, Wet Prep NONE SEEN NONE SEEN   Clue Cells Wet Prep HPF POC PRESENT (A) NONE SEEN   WBC, Wet Prep HPF POC FEW (A) NONE SEEN   Sperm NONE SEEN   Urinalysis, Routine w reflex microscopic  Result Value Ref Range   Color, Urine YELLOW YELLOW   APPearance CLEAR CLEAR   Specific Gravity, Urine 1.013 1.005 - 1.030   pH 7.0 5.0 - 8.0   Glucose, UA NEGATIVE NEGATIVE mg/dL   Hgb urine dipstick NEGATIVE NEGATIVE   Bilirubin Urine NEGATIVE NEGATIVE   Ketones, ur NEGATIVE NEGATIVE mg/dL   Protein, ur NEGATIVE NEGATIVE mg/dL   Nitrite NEGATIVE NEGATIVE   Leukocytes, UA NEGATIVE NEGATIVE  CBC  Result Value Ref Range   WBC 2.6 (L) 4.0 - 10.5 K/uL   RBC 4.01 3.87 - 5.11 MIL/uL   Hemoglobin 12.6 12.0 - 15.0 g/dL   HCT 16.1 09.6 - 04.5 %   MCV 96.8 78.0 - 100.0 fL   MCH 31.4  26.0 - 34.0 pg   MCHC 32.5 30.0 - 36.0 g/dL   RDW 40.9 81.1 - 91.4 %   Platelets 151 150 - 400 K/uL  Comprehensive metabolic panel  Result Value Ref Range   Sodium 140 135 - 145 mmol/L   Potassium 3.9 3.5 - 5.1 mmol/L   Chloride 111 101 - 111 mmol/L  CO2 21 (L) 22 - 32 mmol/L   Glucose, Bld 84 65 - 99 mg/dL   BUN 16 6 - 20 mg/dL   Creatinine, Ser 6.960.85 0.44 - 1.00 mg/dL   Calcium 8.6 (L) 8.9 - 10.3 mg/dL   Total Protein 6.0 (L) 6.5 - 8.1 g/dL   Albumin 3.5 3.5 - 5.0 g/dL   AST 20 15 - 41 U/L   ALT 15 14 - 54 U/L   Alkaline Phosphatase 54 38 - 126 U/L   Total Bilirubin 0.7 0.3 - 1.2 mg/dL   GFR calc non Af Amer >60 >60 mL/min   GFR calc Af Amer >60 >60 mL/min   Anion gap 8 5 - 15   UA unremarkable, but patient with strong history of renal stones and reports that in the past no blood in the urine was noted in previous times as well. Renal U/S ordered.  U/S showing likely bilateral renal calculi, with mild left hydronephrosis and largest left renal calculi measuring 11 mm; no left ureter jet seen.   Pain previously improved, now in pain again. Morphine 2 g given and IVFs started.   --Discussed with Dr. Ernestina PennaFogleman. Will consult urology. --Called Dr. Vernie Ammonsttelin' office and spoke with urologist on call for [facility]. Recommend that if we can control patient's pain, she can go home with follow up with urology outpatient tomorrow. He recommends getting a KUB prior to discharge to assist with choice of procedure if she needs one.   Assessment and Plan  Assessment: 1. Bilateral nephrolithiasis   2. Left flank pain   3. History of nephrolithiasis   4. Nephrolithiasis   5. Hydronephrosis of left kidney   6. BV (bacterial vaginosis)     Plan: --Discharge home in stable condition, on NSAID, Flomax and rx also given for Percocet #10 to be taken prn.  --Follow up with urology tomorrow.  --Rx for flagyl for BV  Chelsea Pedretti, Kandra NicolasJulie P, MD 05/04/2017 4:20 PM

## 2017-05-05 DIAGNOSIS — N200 Calculus of kidney: Secondary | ICD-10-CM | POA: Diagnosis not present

## 2017-05-05 DIAGNOSIS — N201 Calculus of ureter: Secondary | ICD-10-CM | POA: Diagnosis not present

## 2017-05-05 LAB — GC/CHLAMYDIA PROBE AMP (~~LOC~~) NOT AT ARMC
Chlamydia: NEGATIVE
Neisseria Gonorrhea: NEGATIVE

## 2017-06-29 DIAGNOSIS — D709 Neutropenia, unspecified: Secondary | ICD-10-CM | POA: Diagnosis not present

## 2017-06-29 DIAGNOSIS — J309 Allergic rhinitis, unspecified: Secondary | ICD-10-CM | POA: Diagnosis not present

## 2017-07-09 DIAGNOSIS — M328 Other forms of systemic lupus erythematosus: Secondary | ICD-10-CM | POA: Diagnosis not present

## 2017-07-09 DIAGNOSIS — G43009 Migraine without aura, not intractable, without status migrainosus: Secondary | ICD-10-CM | POA: Diagnosis not present

## 2017-07-09 DIAGNOSIS — G47419 Narcolepsy without cataplexy: Secondary | ICD-10-CM | POA: Diagnosis not present

## 2017-08-03 DIAGNOSIS — M545 Low back pain: Secondary | ICD-10-CM | POA: Diagnosis not present

## 2017-08-03 DIAGNOSIS — M150 Primary generalized (osteo)arthritis: Secondary | ICD-10-CM | POA: Diagnosis not present

## 2017-08-03 DIAGNOSIS — I7300 Raynaud's syndrome without gangrene: Secondary | ICD-10-CM | POA: Diagnosis not present

## 2017-08-03 DIAGNOSIS — M359 Systemic involvement of connective tissue, unspecified: Secondary | ICD-10-CM | POA: Diagnosis not present

## 2017-08-05 DIAGNOSIS — R05 Cough: Secondary | ICD-10-CM | POA: Diagnosis not present

## 2017-08-10 DIAGNOSIS — Z1211 Encounter for screening for malignant neoplasm of colon: Secondary | ICD-10-CM | POA: Diagnosis not present

## 2017-08-10 DIAGNOSIS — E7800 Pure hypercholesterolemia, unspecified: Secondary | ICD-10-CM | POA: Diagnosis not present

## 2017-08-10 DIAGNOSIS — E559 Vitamin D deficiency, unspecified: Secondary | ICD-10-CM | POA: Diagnosis not present

## 2017-08-10 DIAGNOSIS — Z79899 Other long term (current) drug therapy: Secondary | ICD-10-CM | POA: Diagnosis not present

## 2017-08-10 DIAGNOSIS — Z0000 Encounter for general adult medical examination without abnormal findings: Secondary | ICD-10-CM | POA: Diagnosis not present

## 2017-08-21 DIAGNOSIS — Z79899 Other long term (current) drug therapy: Secondary | ICD-10-CM | POA: Diagnosis not present

## 2017-08-21 DIAGNOSIS — H353132 Nonexudative age-related macular degeneration, bilateral, intermediate dry stage: Secondary | ICD-10-CM | POA: Diagnosis not present

## 2017-09-23 DIAGNOSIS — N760 Acute vaginitis: Secondary | ICD-10-CM | POA: Diagnosis not present

## 2017-09-23 DIAGNOSIS — Z01419 Encounter for gynecological examination (general) (routine) without abnormal findings: Secondary | ICD-10-CM | POA: Diagnosis not present

## 2017-09-23 DIAGNOSIS — Z6821 Body mass index (BMI) 21.0-21.9, adult: Secondary | ICD-10-CM | POA: Diagnosis not present

## 2017-09-23 DIAGNOSIS — N898 Other specified noninflammatory disorders of vagina: Secondary | ICD-10-CM | POA: Diagnosis not present

## 2017-11-23 DIAGNOSIS — R21 Rash and other nonspecific skin eruption: Secondary | ICD-10-CM | POA: Diagnosis not present

## 2017-11-23 DIAGNOSIS — R197 Diarrhea, unspecified: Secondary | ICD-10-CM | POA: Diagnosis not present

## 2017-11-25 DIAGNOSIS — R197 Diarrhea, unspecified: Secondary | ICD-10-CM | POA: Diagnosis not present

## 2017-12-06 ENCOUNTER — Emergency Department (HOSPITAL_BASED_OUTPATIENT_CLINIC_OR_DEPARTMENT_OTHER): Payer: BLUE CROSS/BLUE SHIELD

## 2017-12-06 ENCOUNTER — Encounter (HOSPITAL_BASED_OUTPATIENT_CLINIC_OR_DEPARTMENT_OTHER): Payer: Self-pay | Admitting: Emergency Medicine

## 2017-12-06 ENCOUNTER — Other Ambulatory Visit: Payer: Self-pay

## 2017-12-06 ENCOUNTER — Emergency Department (HOSPITAL_BASED_OUTPATIENT_CLINIC_OR_DEPARTMENT_OTHER)
Admission: EM | Admit: 2017-12-06 | Discharge: 2017-12-06 | Disposition: A | Discharge status: Home / Self Care | Payer: BLUE CROSS/BLUE SHIELD | Attending: Emergency Medicine | Admitting: Emergency Medicine

## 2017-12-06 DIAGNOSIS — Y939 Activity, unspecified: Secondary | ICD-10-CM | POA: Diagnosis not present

## 2017-12-06 DIAGNOSIS — Z7982 Long term (current) use of aspirin: Secondary | ICD-10-CM | POA: Insufficient documentation

## 2017-12-06 DIAGNOSIS — R51 Headache: Secondary | ICD-10-CM | POA: Diagnosis not present

## 2017-12-06 DIAGNOSIS — R200 Anesthesia of skin: Secondary | ICD-10-CM | POA: Diagnosis not present

## 2017-12-06 DIAGNOSIS — S4991XA Unspecified injury of right shoulder and upper arm, initial encounter: Secondary | ICD-10-CM | POA: Diagnosis not present

## 2017-12-06 DIAGNOSIS — S0990XA Unspecified injury of head, initial encounter: Secondary | ICD-10-CM | POA: Diagnosis not present

## 2017-12-06 DIAGNOSIS — S46911A Strain of unspecified muscle, fascia and tendon at shoulder and upper arm level, right arm, initial encounter: Secondary | ICD-10-CM | POA: Insufficient documentation

## 2017-12-06 DIAGNOSIS — Y999 Unspecified external cause status: Secondary | ICD-10-CM | POA: Diagnosis not present

## 2017-12-06 DIAGNOSIS — W19XXXA Unspecified fall, initial encounter: Secondary | ICD-10-CM | POA: Diagnosis not present

## 2017-12-06 DIAGNOSIS — Z79899 Other long term (current) drug therapy: Secondary | ICD-10-CM | POA: Insufficient documentation

## 2017-12-06 DIAGNOSIS — Y929 Unspecified place or not applicable: Secondary | ICD-10-CM | POA: Diagnosis not present

## 2017-12-06 DIAGNOSIS — Z91040 Latex allergy status: Secondary | ICD-10-CM | POA: Insufficient documentation

## 2017-12-06 DIAGNOSIS — S161XXA Strain of muscle, fascia and tendon at neck level, initial encounter: Secondary | ICD-10-CM | POA: Insufficient documentation

## 2017-12-06 DIAGNOSIS — M542 Cervicalgia: Secondary | ICD-10-CM | POA: Diagnosis not present

## 2017-12-06 DIAGNOSIS — S199XXA Unspecified injury of neck, initial encounter: Secondary | ICD-10-CM | POA: Diagnosis not present

## 2017-12-06 IMAGING — CT CT CERVICAL SPINE W/O CM
4 of 7 series · 13 of 33 positions shown, 14 images · non-contrast
Comparison: None.

CLINICAL DATA: Fall.  Headache and right-sided neck pain.

EXAM:
CT HEAD WITHOUT CONTRAST
CT CERVICAL SPINE WITHOUT CONTRAST
TECHNIQUE: Multidetector CT imaging of the head and cervical spine was
performed following the standard protocol without intravenous
contrast. Multiplanar CT image reconstructions of the cervical spine
were also generated.

[Series 4: head 3.0 mpr cor · coronal · 0.33mm/px · 2 of 64 slices shown]
[im 22/64  bone]
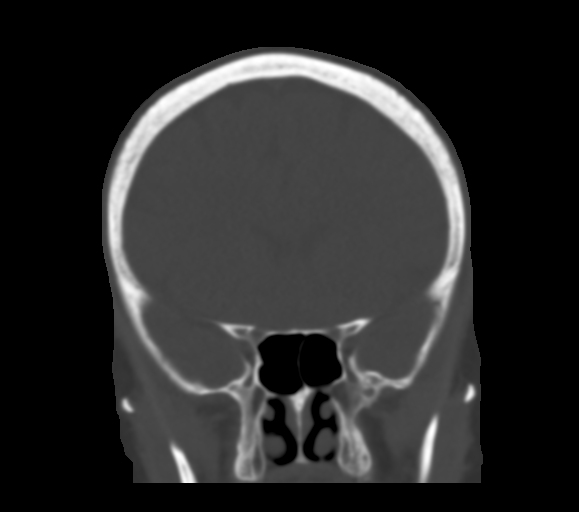
[im 43/64  bone]
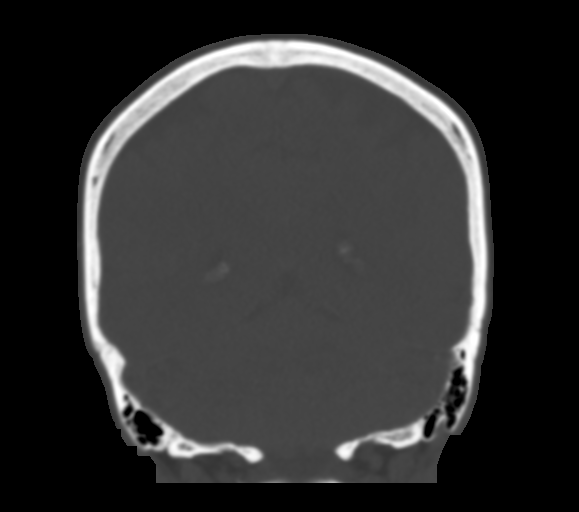

[Series 7: c_spine 2.0 i30s 3 · axial · 0.21mm/px · z∈[+486,+568]mm · 4 of 69 slices shown, 5 images]
[im 14/69  soft-tissue]
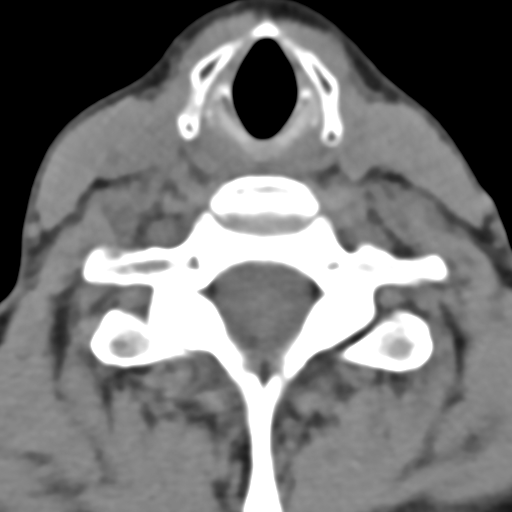
[im 14/69  bone]
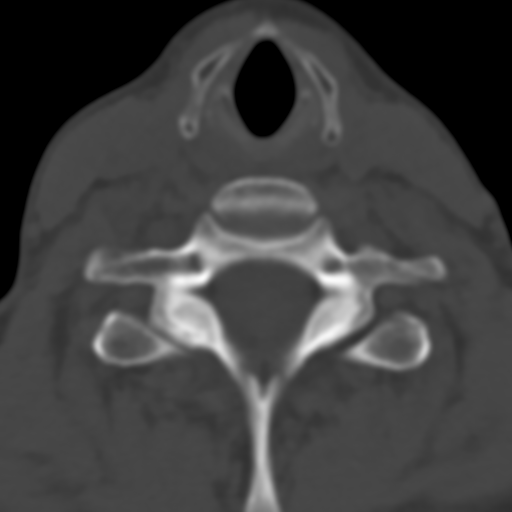
[im 28/69  bone]
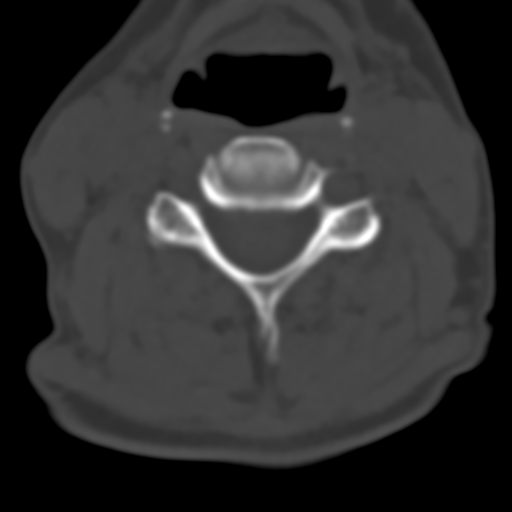
[im 41/69  bone]
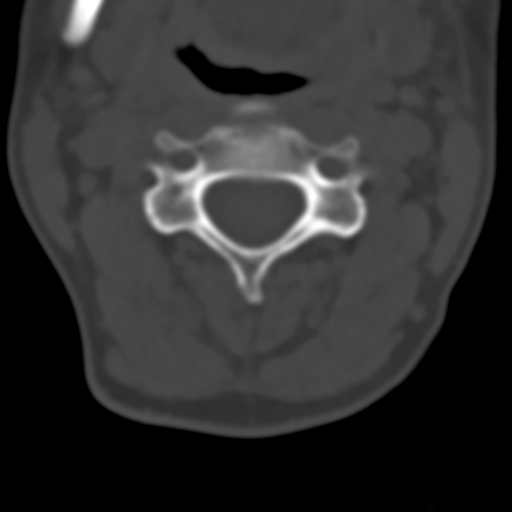
[im 55/69  bone]
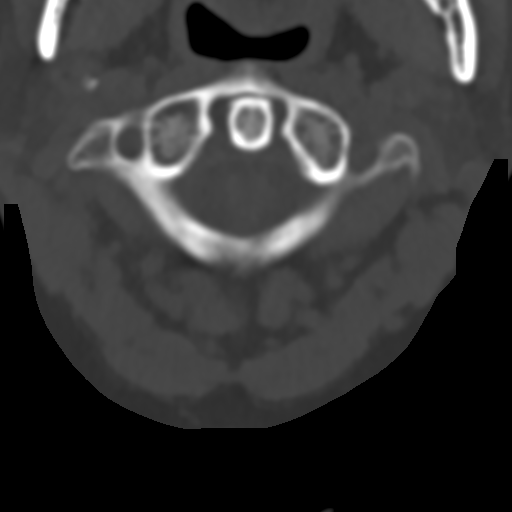

[Series 10: sagittals · sagittal · 0.31mm/px · 3 of 33 slices shown]
[im 9/33  bone]
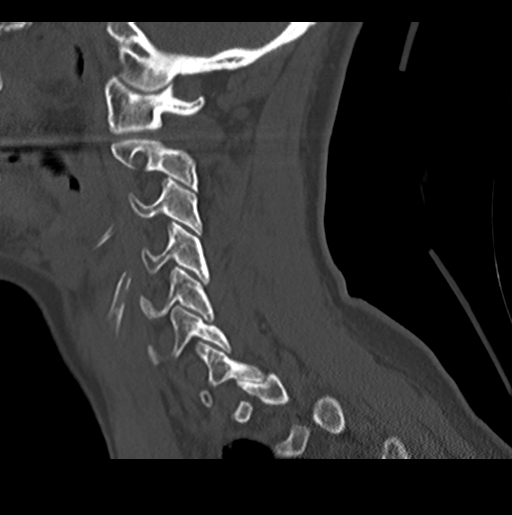
[im 17/33  bone]
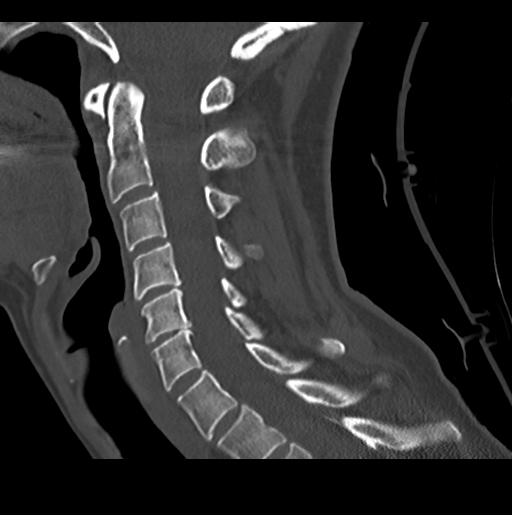
[im 25/33  bone]
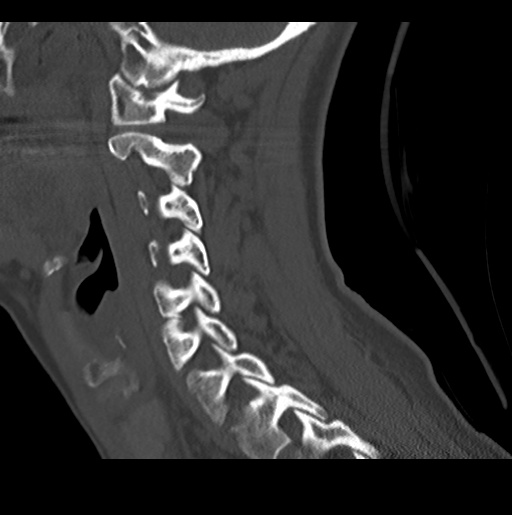

[Series 11: orthogonals · axial · 0.23mm/px · z∈[+470,+542]mm · 4 of 67 slices shown]
[im 14/67  bone]
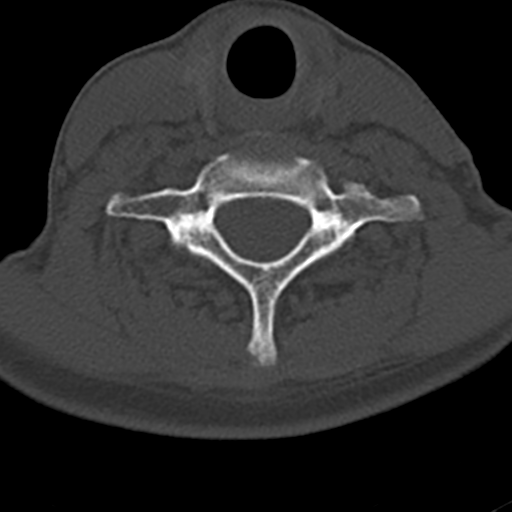
[im 27/67  bone]
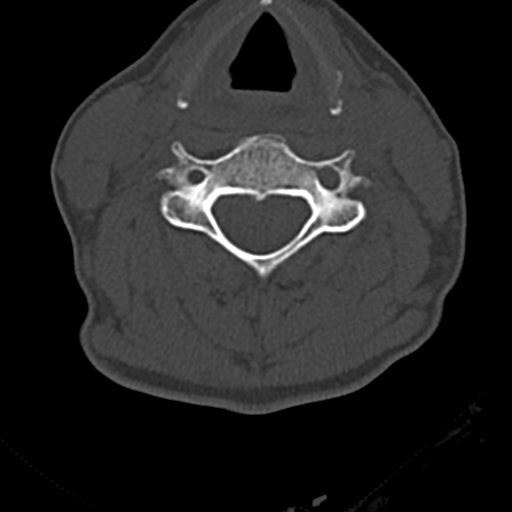
[im 40/67  bone]
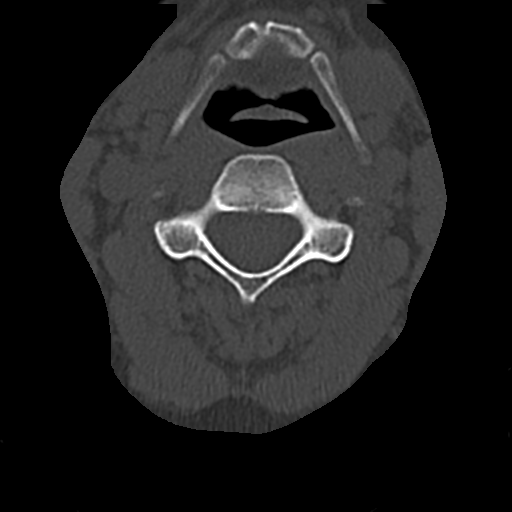
[im 53/67  bone]
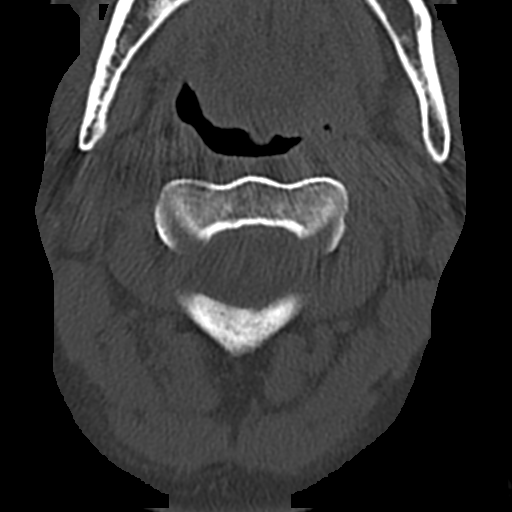

[13 of 33 positions shown; findings below may reference images not displayed]

FINDINGS: CT HEAD FINDINGS

Brain: No evidence of acute infarction, hemorrhage, hydrocephalus,
extra-axial collection or mass lesion/mass effect.

Vascular: No hyperdense vessel or unexpected calcification.

Skull: Normal. Negative for fracture or focal lesion.

Sinuses/Orbits: Frothy secretions and small air-fluid levels in the
bilateral maxillary sinuses and ethmoid air cells. Remaining
paranasal sinuses and mastoid air cells are clear. The orbits are
unremarkable.

Other: None.

CT CERVICAL SPINE FINDINGS

Alignment: Normal.

Skull base and vertebrae: No acute fracture. No primary bone lesion
or focal pathologic process. Small bilateral cervical ribs at C7.

Soft tissues and spinal canal: No prevertebral fluid or swelling. No
visible canal hematoma.

Disc levels: Mild disc height loss and uncovertebral hypertrophy at
C4-C5 and C5-C6.

Upper chest: Negative.

Other: None.
IMPRESSION: 1.  No acute intracranial abnormality.
2.  No acute cervical spine fracture.
3. Bilateral ethmoid air cell and maxillary sinus disease with small
air-fluid levels. Correlate for acute sinusitis.

## 2017-12-06 IMAGING — DX DG SHOULDER 2+V*R*
2 series · 2 of 2 positions shown · non-contrast
Comparison: None.

CLINICAL DATA: 56 year-old female tripped over the dog around lunch
today and fell hitting her head and her RIGHT shoulder on the [place].

EXAM:
RIGHT SHOULDER - 2+ VIEW

[shoulder ap]
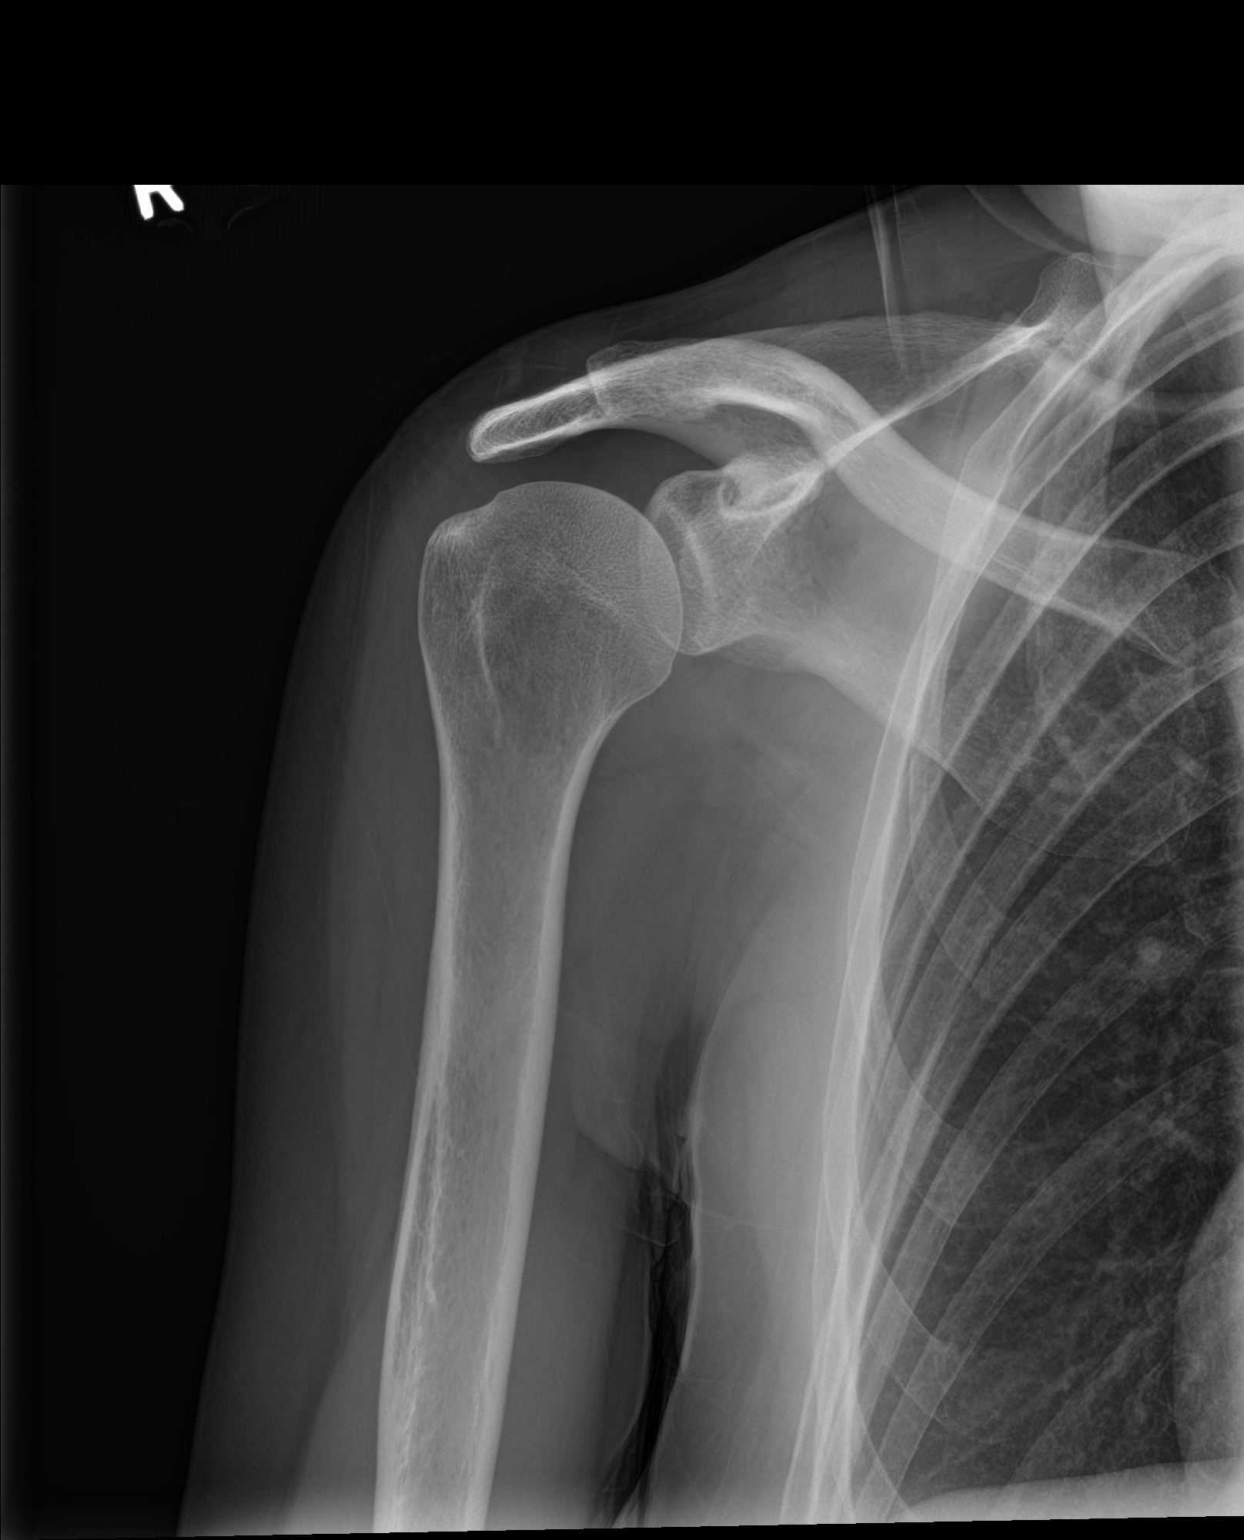

[shoulder grashey]
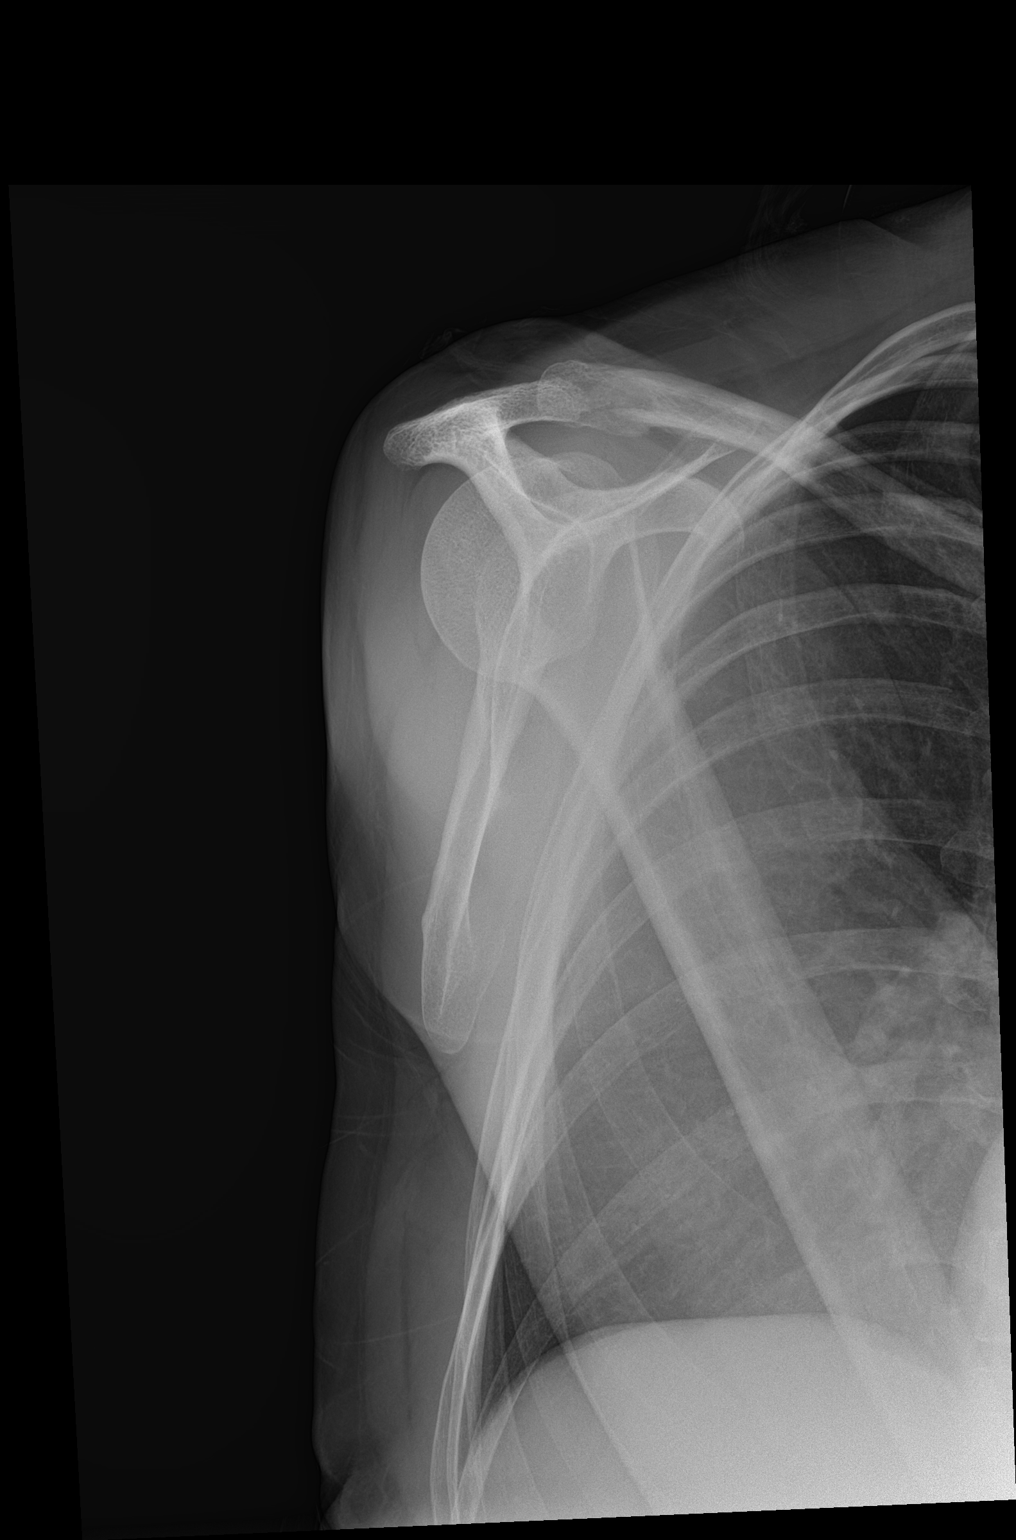

[2 of 2 positions shown; findings below may reference images not displayed]

FINDINGS: There is no evidence of fracture or dislocation. There is no
evidence of arthropathy or other focal bone abnormality. Soft
tissues are unremarkable.
IMPRESSION: Negative.

## 2017-12-06 MED ORDER — HYDROCODONE-ACETAMINOPHEN 5-325 MG PO TABS: 1.0000 | ORAL_TABLET | Freq: Four times a day (QID) | ORAL | 0 refills | Status: DC | PRN

## 2017-12-06 MED ORDER — ONDANSETRON 4 MG PO TBDP
4.0000 mg | ORAL_TABLET | Freq: Once | ORAL | Status: AC
  Administered 2017-12-06: 4 mg via ORAL
  Filled 2017-12-06: qty 1

## 2017-12-06 MED ORDER — ONDANSETRON 4 MG PO TBDP: 4.0000 mg | ORAL_TABLET | Freq: Three times a day (TID) | ORAL | 1 refills | Status: DC | PRN

## 2017-12-06 MED ORDER — HYDROCODONE-ACETAMINOPHEN 5-325 MG PO TABS
1.0000 | ORAL_TABLET | Freq: Once | ORAL | Status: AC
  Administered 2017-12-06: 1 via ORAL
  Filled 2017-12-06: qty 1

## 2017-12-06 NOTE — Discharge Instructions (Addendum)
CT head neck without any acute injuries.  X-ray of the right shoulder without any significant abnormalities.  Take the hydrocodone as needed for pain.  Take the Zofran along with it to prevent any nausea and vomiting.  If not improving follow-up with your doctor in the next several days.

## 2017-12-06 NOTE — ED Triage Notes (Addendum)
Patient states that she fell at about lunch time  - the patient states that she hit the left forehead- Numbness to left thumb and 1st finger. Patient also complains of right sided neck pain  - patient denies any LOC

## 2017-12-06 NOTE — ED Provider Notes (Signed)
[facility] EMERGENCY DEPARTMENT Provider Note   CSN: [id number] Arrival date & time: 12/06/17  1502     History   Chief Complaint Chief Complaint  Patient presents with  . Fall    HPI KEYASHA MIAH is a 57 y.o. female.  Patient status post fall about 1330 today.  She fell forward hitting the left forehead.  Complaint of numbness to left thumb and index finger.  Also complaining of right shoulder pain right sided neck pain.  And her right index finger feels bruised.  No numbness to the right fingers.  And other than the numbness on the left thumb and index finger no other complaint of any pain in the left arm.  No lower extremity pain.  No back pain.  No abdominal pain no chest pain no shortness of breath.  Patient's past medical history significant for lupus migraines narcolepsy osteopenia.     Past Medical History:  Diagnosis Date  . Arthritis   . Hypersomnolence   . Kidney stones   . Lupus (HCC)   . Lupus (HCC)   . Migraine   . Migraines   . Narcolepsy   . Osteopenia   . Raynaud's disease   . Sleep apnea    no cpap  . Vitamin deficiency    low d    Patient Active Problem List   Diagnosis Date Noted  . Chest pain 08/05/2013  . Hypersomnolence 07/01/2011    Past Surgical History:  Procedure Laterality Date  . ABDOMINAL HYSTERECTOMY       OB History   None      Home Medications    Prior to Admission medications   Medication Sig Start Date End Date Taking? Authorizing Provider  aspirin 81 MG tablet Take 81 mg by mouth daily.      [provider]  Calcium Carbonate-Vit D-Min (CALCIUM 1200 PO) Take 1,500 mg by mouth daily.    [provider]  cetirizine (ZYRTEC) 10 MG tablet Take 10 mg by mouth daily.    [provider]  cholecalciferol (VITAMIN D) 1000 UNITS tablet Take 1,000 Units by mouth daily.      [provider]  Erenumab-aooe 70 MG/ML SOAJ Inject into the skin. 01/12/17 01/12/18  [provider]  estradiol (VIVELLE-DOT) 0.0375 MG/24HR Place 1 patch onto the skin 2 (two) times a week.    [provider]  HYDROcodone-acetaminophen (NORCO/VICODIN) 5-325 MG tablet Take 1-2 tablets by mouth every 6 (six) hours as needed for moderate pain. 12/06/17   Vanetta Mulders, MD  hydroxychloroquine (PLAQUENIL) 200 MG tablet Take 100 mg by mouth daily.      [provider]  ibuprofen (ADVIL,MOTRIN) 600 MG tablet Take 1 tablet (600 mg total) by mouth every 8 (eight) hours. 05/04/17   Degele, Kandra Nicolas, MD  Lactobacillus Rhamnosus, GG, (CULTURELLE PO) Take by mouth.    [provider]  magnesium oxide (MAG-OX) 400 MG tablet Take 400 mg by mouth daily.      [provider]  modafinil (PROVIGIL) 100 MG tablet Take 2 tablets (200 mg total) by mouth daily. 07/01/11   Oretha Milch, MD  ondansetron (ZOFRAN ODT) 4 MG disintegrating tablet Take 1 tablet (4 mg total) by mouth every 8 (eight) hours as needed. 12/06/17   Vanetta Mulders, MD  oxyCODONE-acetaminophen (ROXICET) 5-325 MG tablet Take 1-2 tablets by mouth every 8 (eight) hours as needed for severe pain. 05/04/17   Degele, Kandra Nicolas, MD  Potassium Citrate 15  MEQ (1620 MG) TBCR  02/23/17   [provider]  tamsulosin (FLOMAX) 0.4 MG CAPS capsule Take 1 capsule (0.4 mg total) by mouth daily. 05/04/17   Degele, Kandra Nicolas, MD  zonisamide (ZONEGRAN) 100 MG capsule Take 400 mg by mouth daily.     [provider]    Family History Family History  Problem Relation Age of Onset  . Asthma Mother   . Hypertension Mother   . CVA Mother   . Breast cancer Mother   . Heart disease Father   . Stroke Father   . Macular degeneration Father   . Diabetes type II Father   . Kidney failure Father   . Heart disease Maternal Grandfather   . Heart disease Maternal Grandmother   . Heart disease Paternal Grandfather   . Cancer Paternal Grandmother     Social History Social History   Tobacco Use  . Smoking  status: Never Smoker  . Smokeless tobacco: Never Used  Substance Use Topics  . Alcohol use: No  . Drug use: No     Allergies   Adhesive [tape]; Codeine; Other; Terbinafine hcl; Latex; and Penicillins cross reactors   Review of Systems Review of Systems  Constitutional: Negative for fever.  HENT: Negative for congestion.   Eyes: Negative for visual disturbance.  Respiratory: Negative for shortness of breath.   Cardiovascular: Negative for chest pain.  Gastrointestinal: Negative for abdominal pain, nausea and vomiting.  Genitourinary: Negative for dysuria.  Musculoskeletal: Positive for neck pain. Negative for back pain.  Neurological: Positive for numbness and headaches.  Hematological: Does not bruise/bleed easily.  Psychiatric/Behavioral: Negative for confusion.     Physical Exam Updated Vital Signs BP 117/69   Pulse (!) 55   Temp 98.4 F (36.9 C) (Oral)   Resp 16   Ht 1.651 m (5\' 5" )   Wt 54.9 kg (121 lb)   SpO2 98%   BMI 20.14 kg/m   Physical Exam  Constitutional: She is oriented to person, place, and time. She appears well-developed and well-nourished. No distress.  HENT:  Head: Normocephalic and atraumatic.  Mouth/Throat: Oropharynx is clear and moist.  Eyes: Pupils are equal, round, and reactive to light. Conjunctivae and EOM are normal.  Neck:  Cervical collar in place.  Cardiovascular: Normal rate, regular rhythm and normal heart sounds.  Pulmonary/Chest: Effort normal and breath sounds normal. No respiratory distress.  Abdominal: Soft. Bowel sounds are normal. There is no tenderness.  Musculoskeletal: Normal range of motion. She exhibits tenderness.  Patient with some discomfort with range of motion at the shoulder on the right side.  Right index finger with slight swelling but good range of motion.  No deformity.  Left hand with good cap refill patient has subjective numbness to the thumb and index finger.  Radial pulses are 2+ on both sides.  Excellent  range of motion of the entire left arm.  Neurological: She is alert and oriented to person, place, and time. No cranial nerve deficit or sensory deficit. She exhibits normal muscle tone. Coordination normal.  Skin: Skin is warm. Capillary refill takes less than 2 seconds. No rash noted.  Nursing note and vitals reviewed.    ED Treatments / Results  Labs (all labs ordered are listed, but only abnormal results are displayed) Labs Reviewed - No data to display  EKG None  Radiology Dg Shoulder Right  Result Date: 12/06/2017 CLINICAL DATA:  57 year-old female tripped over the dog around lunch today and fell hitting her  head and her RIGHT shoulder on the [place] cabinet. EXAM: RIGHT SHOULDER - 2+ VIEW COMPARISON:  None. FINDINGS: There is no evidence of fracture or dislocation. There is no evidence of arthropathy or other focal bone abnormality. Soft tissues are unremarkable. IMPRESSION: Negative. Electronically Signed   By: Norva PavlovElizabeth  Brown M.D.   On: 12/06/2017 16:40   Ct Head Wo Contrast  Result Date: 12/06/2017 CLINICAL DATA:  Fall.  Headache and right-sided neck pain. EXAM: CT HEAD WITHOUT CONTRAST CT CERVICAL SPINE WITHOUT CONTRAST TECHNIQUE: Multidetector CT imaging of the head and cervical spine was performed following the standard protocol without intravenous contrast. Multiplanar CT image reconstructions of the cervical spine were also generated. COMPARISON:  None. FINDINGS: CT HEAD FINDINGS Brain: No evidence of acute infarction, hemorrhage, hydrocephalus, extra-axial collection or mass lesion/mass effect. Vascular: No hyperdense vessel or unexpected calcification. Skull: Normal. Negative for fracture or focal lesion. Sinuses/Orbits: Frothy secretions and small air-fluid levels in the bilateral maxillary sinuses and ethmoid air cells. Remaining paranasal sinuses and mastoid air cells are clear. The orbits are unremarkable. Other: None. CT CERVICAL SPINE FINDINGS Alignment: Normal. Skull base  and vertebrae: No acute fracture. No primary bone lesion or focal pathologic process. Small bilateral cervical ribs at C7. Soft tissues and spinal canal: No prevertebral fluid or swelling. No visible canal hematoma. Disc levels: Mild disc height loss and uncovertebral hypertrophy at C4-C5 and C5-C6. Upper chest: Negative. Other: None. IMPRESSION: 1.  No acute intracranial abnormality. 2.  No acute cervical spine fracture. 3. Bilateral ethmoid air cell and maxillary sinus disease with small air-fluid levels. Correlate for acute sinusitis. Electronically Signed   By: Obie DredgeWilliam T Derry M.D.   On: 12/06/2017 17:07   Ct Cervical Spine Wo Contrast  Result Date: 12/06/2017 CLINICAL DATA:  Fall.  Headache and right-sided neck pain. EXAM: CT HEAD WITHOUT CONTRAST CT CERVICAL SPINE WITHOUT CONTRAST TECHNIQUE: Multidetector CT imaging of the head and cervical spine was performed following the standard protocol without intravenous contrast. Multiplanar CT image reconstructions of the cervical spine were also generated. COMPARISON:  None. FINDINGS: CT HEAD FINDINGS Brain: No evidence of acute infarction, hemorrhage, hydrocephalus, extra-axial collection or mass lesion/mass effect. Vascular: No hyperdense vessel or unexpected calcification. Skull: Normal. Negative for fracture or focal lesion. Sinuses/Orbits: Frothy secretions and small air-fluid levels in the bilateral maxillary sinuses and ethmoid air cells. Remaining paranasal sinuses and mastoid air cells are clear. The orbits are unremarkable. Other: None. CT CERVICAL SPINE FINDINGS Alignment: Normal. Skull base and vertebrae: No acute fracture. No primary bone lesion or focal pathologic process. Small bilateral cervical ribs at C7. Soft tissues and spinal canal: No prevertebral fluid or swelling. No visible canal hematoma. Disc levels: Mild disc height loss and uncovertebral hypertrophy at C4-C5 and C5-C6. Upper chest: Negative. Other: None. IMPRESSION: 1.  No acute  intracranial abnormality. 2.  No acute cervical spine fracture. 3. Bilateral ethmoid air cell and maxillary sinus disease with small air-fluid levels. Correlate for acute sinusitis. Electronically Signed   By: Obie DredgeWilliam T Derry M.D.   On: 12/06/2017 17:07    Procedures Procedures (including critical care time)  Medications Ordered in ED Medications  ondansetron (ZOFRAN-ODT) disintegrating tablet 4 mg (has no administration in time range)  HYDROcodone-acetaminophen (NORCO/VICODIN) 5-325 MG per tablet 1 tablet (has no administration in time range)     Initial Impression / Assessment and Plan / ED Course  I have reviewed the triage vital signs and the nursing notes.  Pertinent labs & imaging results that were available  during my care of the patient were reviewed by me and considered in my medical decision making (see chart for details).     CT head and neck without any acute findings.  X-ray of the right shoulder without any bony injury.  Some evidence of a mild sinusitis patient says she has had nasal congestion for several days.  Patient does seem to have numbness to the left index finger and thumb not able to explain this at this point in time.  Close follow-up with her primary care doctor for further evaluation of this that has not resolved itself over the next week or 2.  Patient stable for discharge home.  No evidence of any significant injury from the fall.  Final Clinical Impressions(s) / ED Diagnoses   Final diagnoses:  Fall, initial encounter  Injury of head, initial encounter  Cervical strain, acute, initial encounter  Right shoulder strain, initial encounter    ED Discharge Orders        Ordered    ondansetron (ZOFRAN ODT) 4 MG disintegrating tablet  Every 8 hours PRN     12/06/17 1814    HYDROcodone-acetaminophen (NORCO/VICODIN) 5-325 MG tablet  Every 6 hours PRN     12/06/17 1814       Vanetta Mulders, MD 12/06/17 1821

## 2017-12-06 NOTE — ED Notes (Signed)
Patient placed in  - collar in triage since she has numbness to her finger after a fall

## 2017-12-15 ENCOUNTER — Other Ambulatory Visit: Payer: Self-pay | Admitting: Gastroenterology

## 2017-12-15 DIAGNOSIS — R634 Abnormal weight loss: Secondary | ICD-10-CM | POA: Diagnosis not present

## 2017-12-15 DIAGNOSIS — D709 Neutropenia, unspecified: Secondary | ICD-10-CM | POA: Diagnosis not present

## 2017-12-15 DIAGNOSIS — R1013 Epigastric pain: Secondary | ICD-10-CM

## 2017-12-15 DIAGNOSIS — R197 Diarrhea, unspecified: Secondary | ICD-10-CM | POA: Diagnosis not present

## 2017-12-16 DIAGNOSIS — R197 Diarrhea, unspecified: Secondary | ICD-10-CM | POA: Diagnosis not present

## 2017-12-17 ENCOUNTER — Ambulatory Visit
Admission: RE | Admit: 2017-12-17 | Discharge: 2017-12-17 | Disposition: A | Discharge status: Home / Self Care | Payer: BLUE CROSS/BLUE SHIELD | Source: Ambulatory Visit | Attending: Gastroenterology | Admitting: Gastroenterology

## 2017-12-17 DIAGNOSIS — R1013 Epigastric pain: Secondary | ICD-10-CM

## 2017-12-17 DIAGNOSIS — R197 Diarrhea, unspecified: Secondary | ICD-10-CM | POA: Diagnosis not present

## 2017-12-17 IMAGING — CT CT ABD-PELV W/ CM
1 of 3 series · 12 of 32 positions shown, 18 images · IV contrast (APPLIED)
Comparison: [DATE]

CLINICAL DATA: Epigastric pain with diarrhea and weight loss

EXAM:
CT ABDOMEN AND PELVIS WITH CONTRAST
TECHNIQUE: Multidetector CT imaging of the abdomen and pelvis was performed
using the standard protocol following bolus administration of
intravenous contrast.
CONTRAST:  75mL [TD] IOPAMIDOL ([TD]) INJECTION 61%

[Series 2: abd/pelvis w/cm · axial · 0.60mm/px · z∈[-396,-76]mm · 12 of 76 slices shown, 18 images]
[im 6/76  soft-tissue]
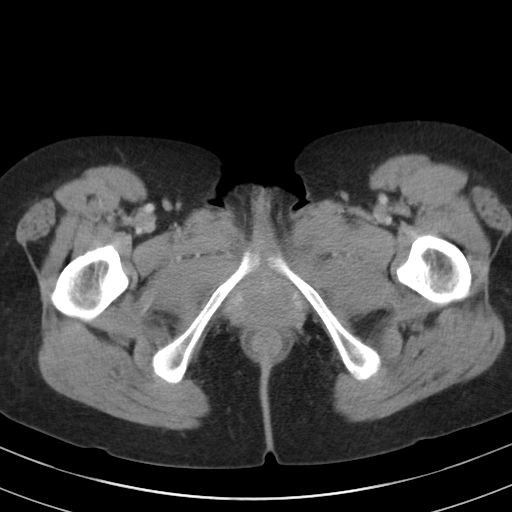
[im 6/76  bone]
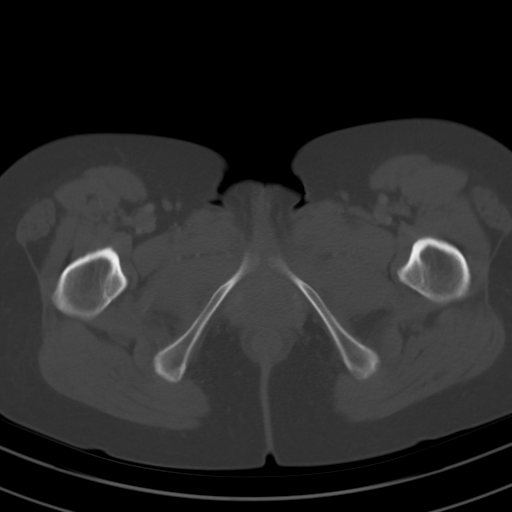
[im 11/76  soft-tissue]
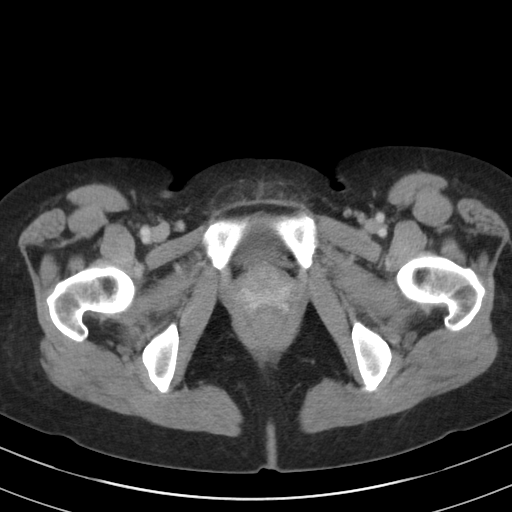
[im 17/76  soft-tissue]
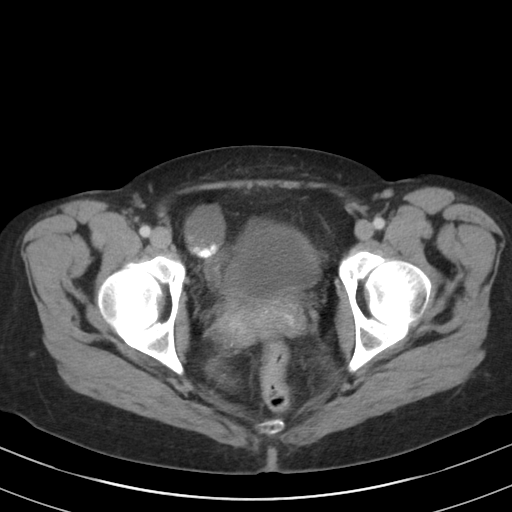
[im 22/76  soft-tissue]
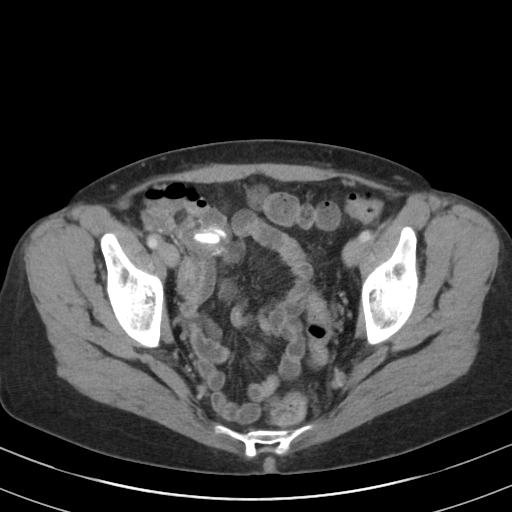
[im 27/76  soft-tissue]
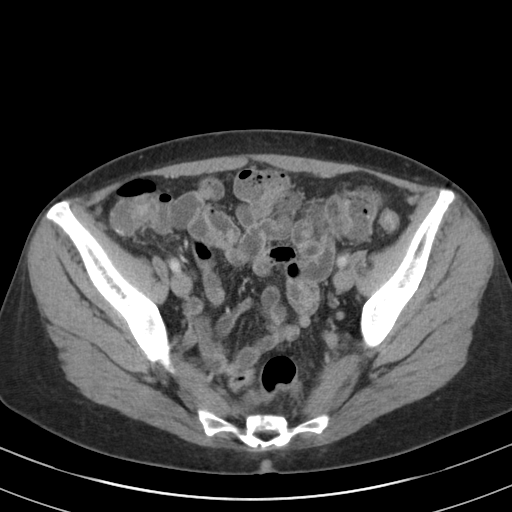
[im 33/76  soft-tissue]
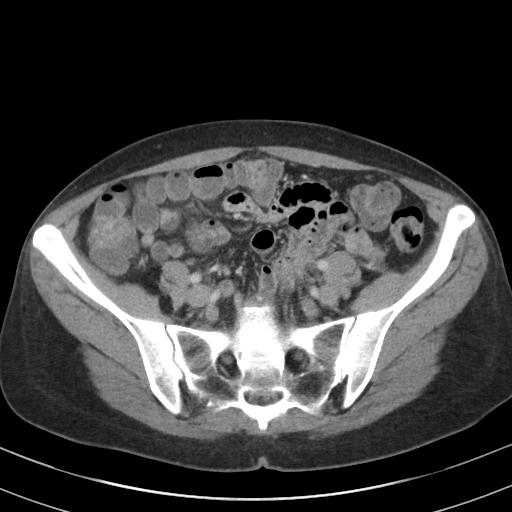
[im 43/76  soft-tissue]
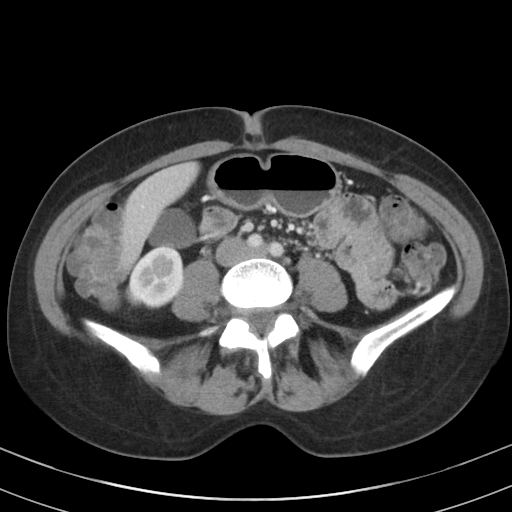
[im 49/76  soft-tissue]
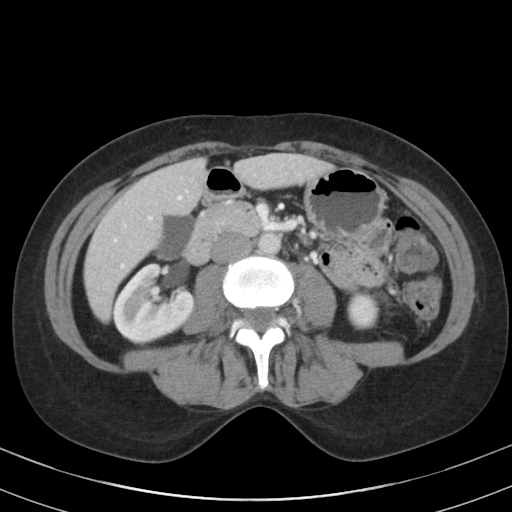
[im 54/76  soft-tissue]
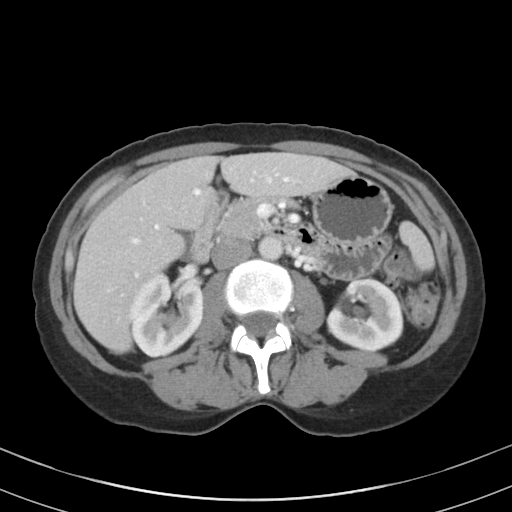
[im 54/76  lung]
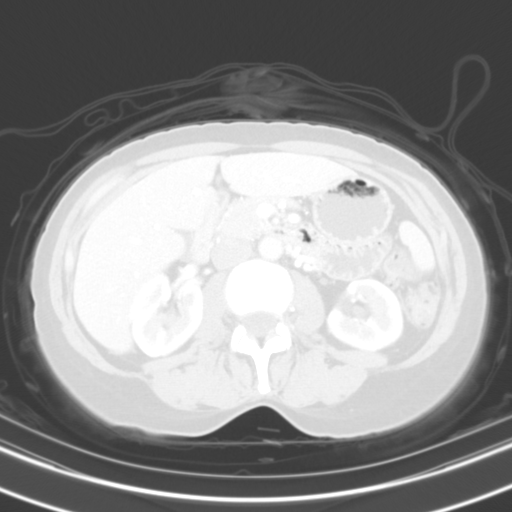
[im 54/76  bone]
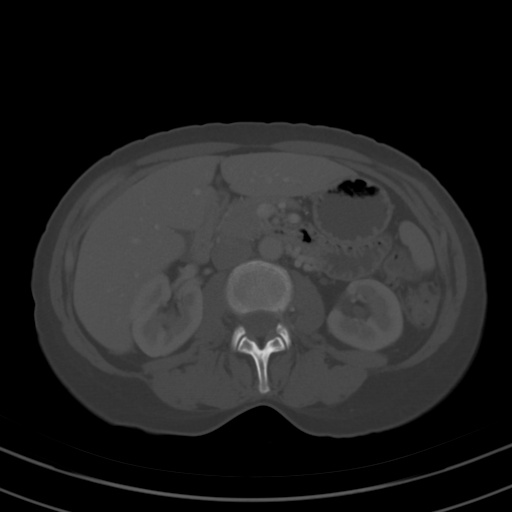
[im 59/76  soft-tissue]
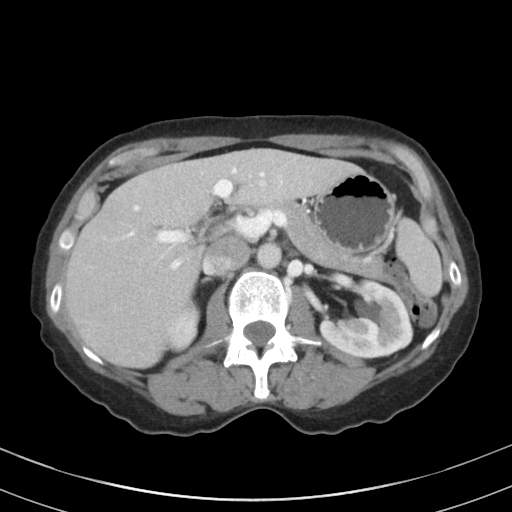
[im 59/76  lung]
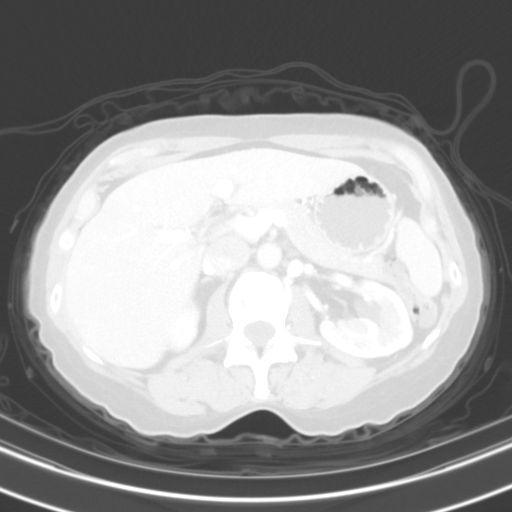
[im 65/76  soft-tissue]
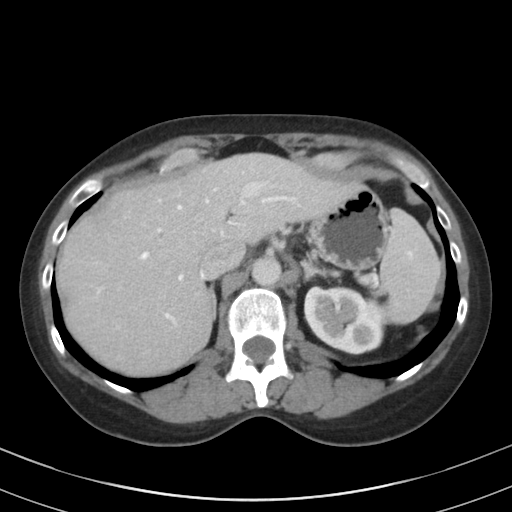
[im 65/76  lung]
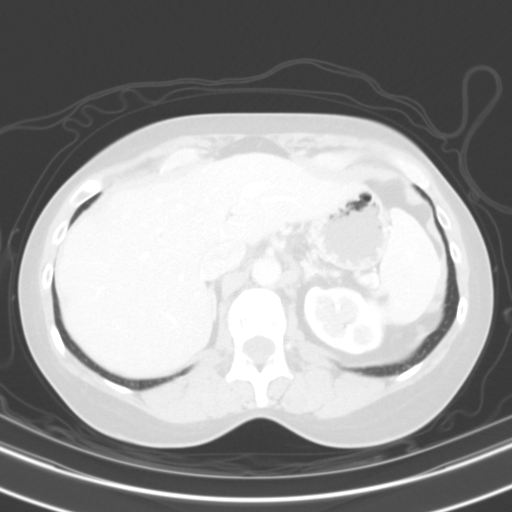
[im 70/76  soft-tissue]
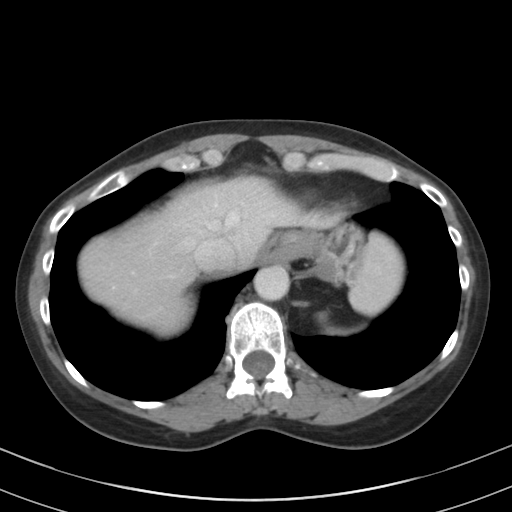
[im 70/76  lung]
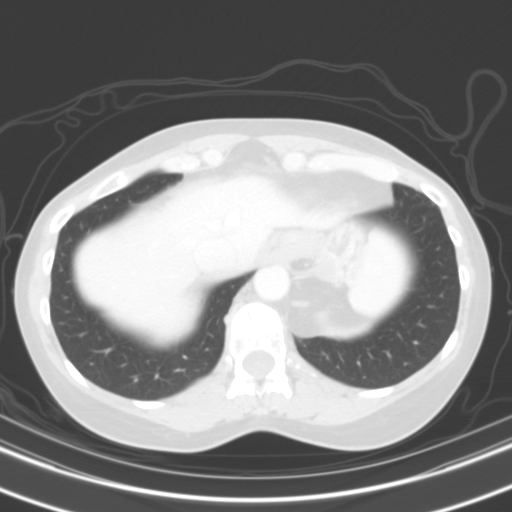

[12 of 32 positions shown; findings below may reference images not displayed]

FINDINGS: Lower chest: Lung bases are clear.

Hepatobiliary: There is slight fatty infiltration near the fissure
for the ligamentum teres. No focal liver lesions are appreciable.
The gallbladder wall is not appreciably thickened. There is no
biliary duct dilatation.

Pancreas: There is no pancreatic mass or inflammatory focus.

Spleen: No splenic lesions are evident.

Adrenals/Urinary Tract: Adrenals bilaterally appear normal. There is
no evident renal mass on either side. There are parapelvic cysts in
the left kidney, largest measuring 1.5 x 1.0 cm. There is a 3 x 2 mm
calculus in the upper pole of the right kidney. There is no ureteral
calculus on either side. Urinary bladder is midline with wall
thickness within normal limits.

Stomach/Bowel: There is no appreciable bowel wall or mesenteric
thickening. There is no evident bowel obstruction. There is no
appreciable free air or portal venous air.

Vascular/Lymphatic: There is no appreciable abdominal aortic
aneurysm. No vascular lesions are evident. There is no adenopathy in
the abdomen or pelvis.

Reproductive: Uterus is absent.  No evident pelvic mass.

Other: Appendix appears unremarkable. There is no ascites or abscess
in the abdomen or pelvis. There is a minimal ventral hernia
containing only fat.

Musculoskeletal: There is degenerative change in the lower lumbar
spine. There are no blastic or lytic bone lesions. There is no
intramuscular lesion evident. No abdominal wall lesions are
appreciable.
IMPRESSION: 1. No appreciable bowel wall thickening or bowel obstruction. No
abscess in the abdomen or pelvis. Appendix appears normal.

2.  No renal or ureteral calculus.  No hydronephrosis.

3.  Uterus absent.

4. No evident adenopathy. No mass evident in the abdomen or pelvis.
No omental lesions evident. A cause for weight loss has not been
established with this study.

## 2017-12-17 MED ORDER — IOPAMIDOL (ISOVUE-300) INJECTION 61%
75.0000 mL | Freq: Once | INTRAVENOUS | Status: AC | PRN
  Administered 2017-12-17: 75 mL via INTRAVENOUS

## 2017-12-30 DIAGNOSIS — R197 Diarrhea, unspecified: Secondary | ICD-10-CM | POA: Diagnosis not present

## 2017-12-30 DIAGNOSIS — R634 Abnormal weight loss: Secondary | ICD-10-CM | POA: Diagnosis not present

## 2017-12-30 DIAGNOSIS — M329 Systemic lupus erythematosus, unspecified: Secondary | ICD-10-CM | POA: Diagnosis not present

## 2017-12-30 DIAGNOSIS — R1013 Epigastric pain: Secondary | ICD-10-CM | POA: Diagnosis not present

## 2018-01-18 DIAGNOSIS — G43009 Migraine without aura, not intractable, without status migrainosus: Secondary | ICD-10-CM | POA: Diagnosis not present

## 2018-01-18 DIAGNOSIS — G47419 Narcolepsy without cataplexy: Secondary | ICD-10-CM | POA: Diagnosis not present

## 2018-02-01 DIAGNOSIS — H5203 Hypermetropia, bilateral: Secondary | ICD-10-CM | POA: Diagnosis not present

## 2018-02-02 DIAGNOSIS — I7300 Raynaud's syndrome without gangrene: Secondary | ICD-10-CM | POA: Diagnosis not present

## 2018-02-02 DIAGNOSIS — M150 Primary generalized (osteo)arthritis: Secondary | ICD-10-CM | POA: Diagnosis not present

## 2018-02-02 DIAGNOSIS — M359 Systemic involvement of connective tissue, unspecified: Secondary | ICD-10-CM | POA: Diagnosis not present

## 2018-02-02 DIAGNOSIS — M545 Low back pain: Secondary | ICD-10-CM | POA: Diagnosis not present

## 2018-03-01 DIAGNOSIS — R634 Abnormal weight loss: Secondary | ICD-10-CM | POA: Diagnosis not present

## 2018-03-01 DIAGNOSIS — R197 Diarrhea, unspecified: Secondary | ICD-10-CM | POA: Diagnosis not present

## 2018-03-01 DIAGNOSIS — R1013 Epigastric pain: Secondary | ICD-10-CM | POA: Diagnosis not present

## 2018-03-01 DIAGNOSIS — M329 Systemic lupus erythematosus, unspecified: Secondary | ICD-10-CM | POA: Diagnosis not present

## 2018-03-12 ENCOUNTER — Other Ambulatory Visit: Payer: Self-pay | Admitting: Obstetrics

## 2018-03-12 DIAGNOSIS — Z1231 Encounter for screening mammogram for malignant neoplasm of breast: Secondary | ICD-10-CM

## 2018-03-16 DIAGNOSIS — R197 Diarrhea, unspecified: Secondary | ICD-10-CM | POA: Diagnosis not present

## 2018-03-16 DIAGNOSIS — R634 Abnormal weight loss: Secondary | ICD-10-CM | POA: Diagnosis not present

## 2018-03-16 DIAGNOSIS — K449 Diaphragmatic hernia without obstruction or gangrene: Secondary | ICD-10-CM | POA: Diagnosis not present

## 2018-03-19 DIAGNOSIS — R634 Abnormal weight loss: Secondary | ICD-10-CM | POA: Diagnosis not present

## 2018-03-19 DIAGNOSIS — R197 Diarrhea, unspecified: Secondary | ICD-10-CM | POA: Diagnosis not present

## 2018-04-12 DIAGNOSIS — Z23 Encounter for immunization: Secondary | ICD-10-CM | POA: Diagnosis not present

## 2018-04-26 ENCOUNTER — Ambulatory Visit
Admission: RE | Admit: 2018-04-26 | Discharge: 2018-04-26 | Disposition: A | Discharge status: Home / Self Care | Payer: BLUE CROSS/BLUE SHIELD | Source: Ambulatory Visit | Attending: Obstetrics | Admitting: Obstetrics

## 2018-04-26 DIAGNOSIS — Z1231 Encounter for screening mammogram for malignant neoplasm of breast: Secondary | ICD-10-CM

## 2018-04-26 IMAGING — MG DIGITAL SCREENING BILATERAL MAMMOGRAM WITH TOMO AND CAD
5 series · 6 of 17 positions shown · non-contrast
Comparison: Previous exam(s).

Addendum:
CLINICAL DATA: Screening.

EXAM:
DIGITAL SCREENING BILATERAL MAMMOGRAM WITH TOMO AND CAD

[L CC synth-2D]
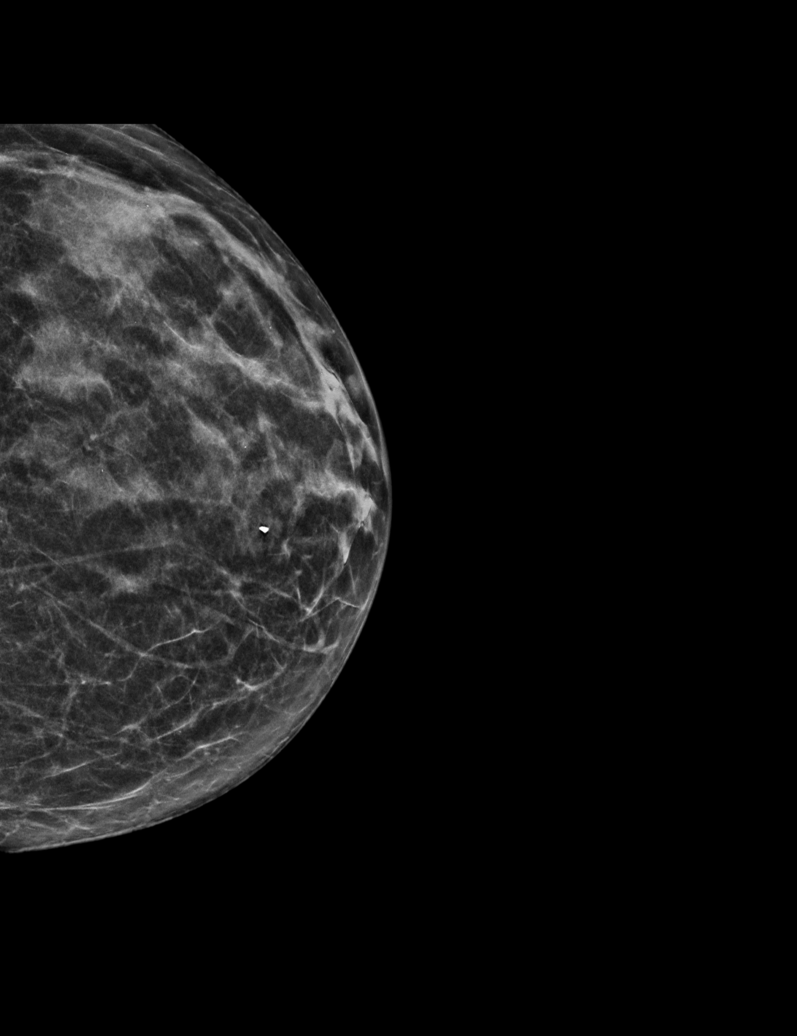

[R CC synth-2D]
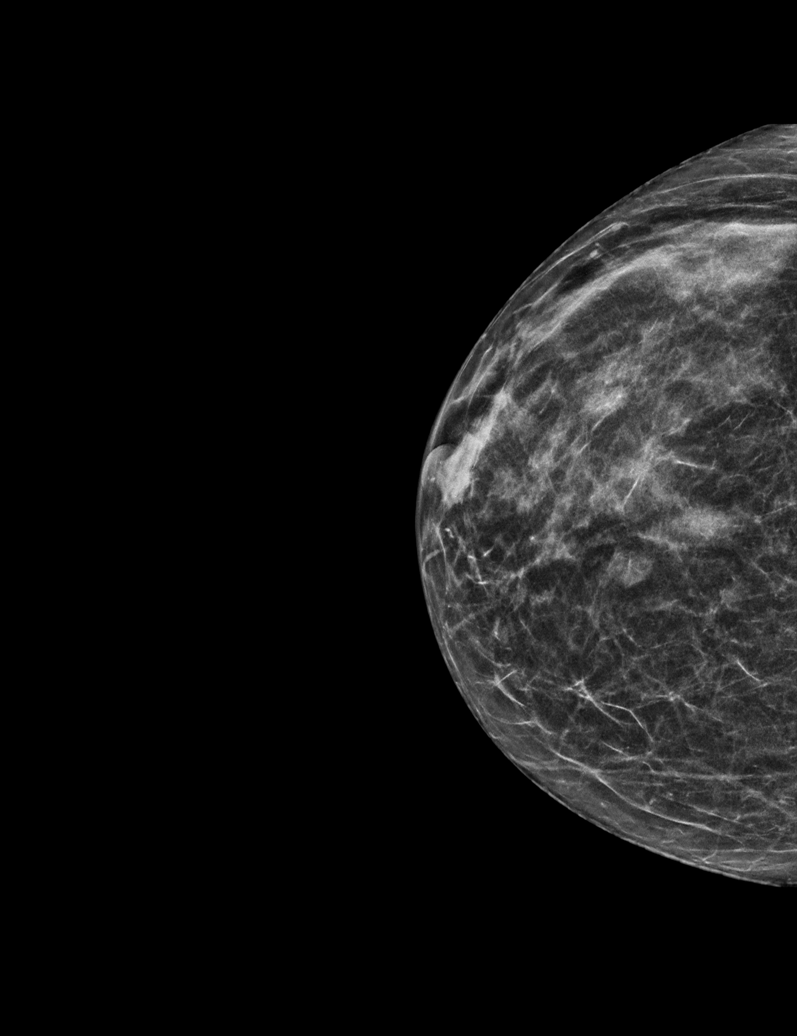

[L CC tomo · 2 of 45 frames shown]
[frame 15/45]
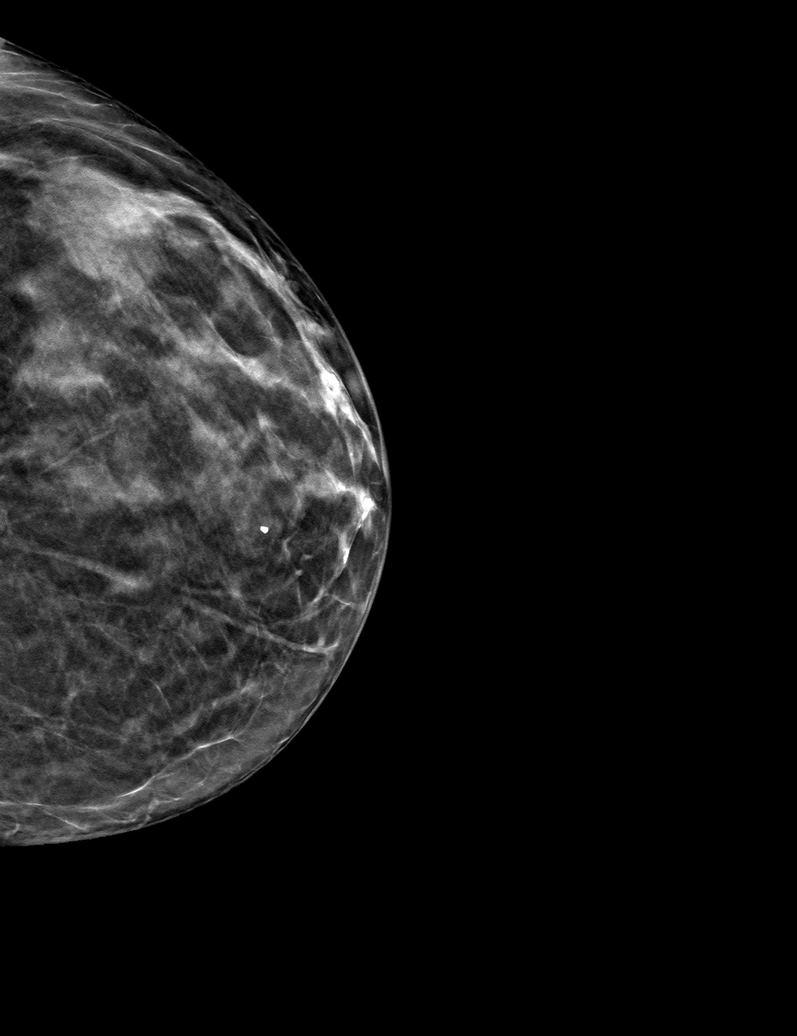
[frame 23/45]
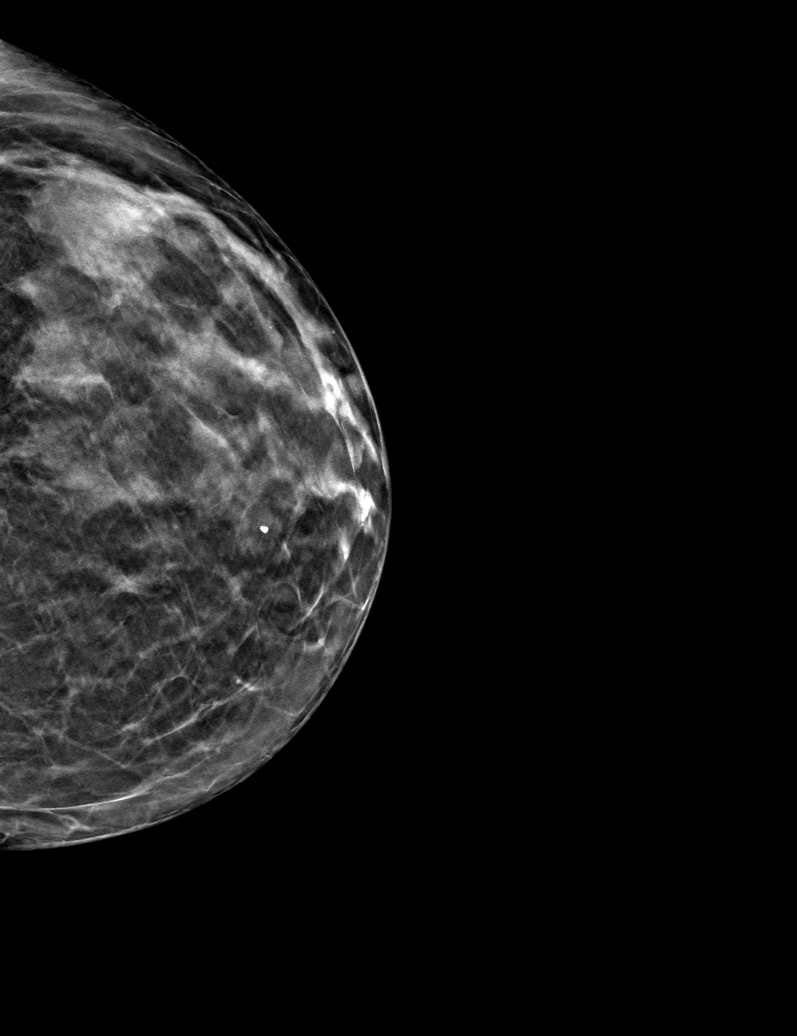

[R MLO tomo · tomo slice 25/49.0]
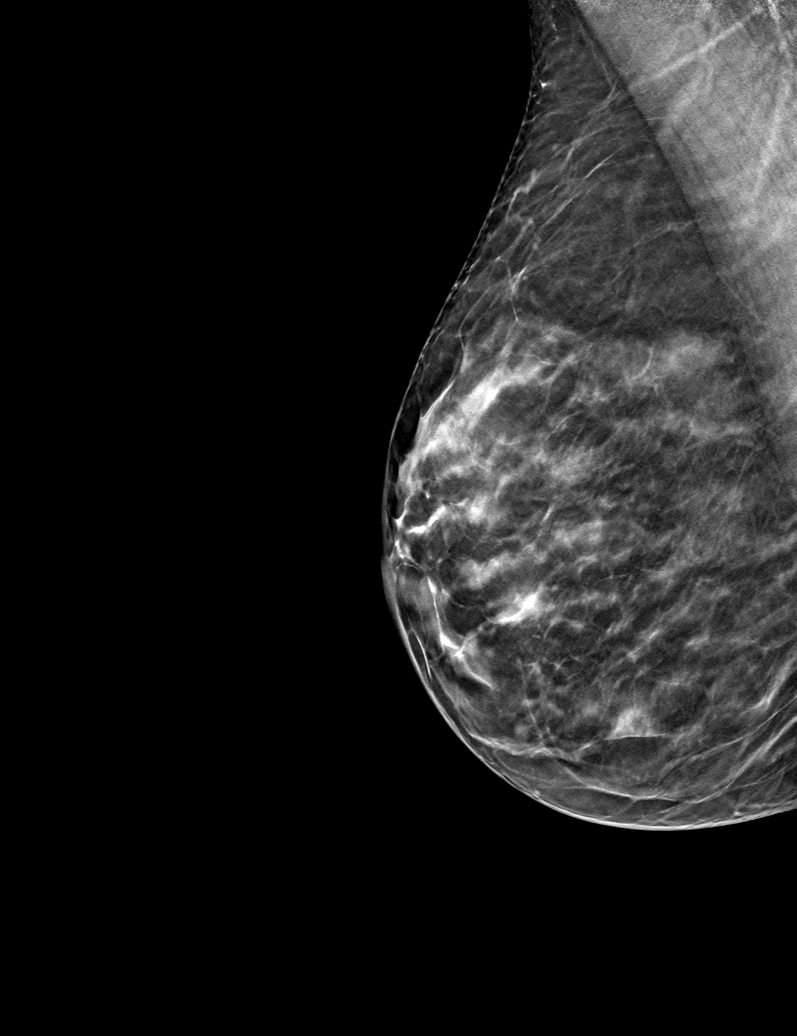

[R CC tomo · tomo slice 23/45.0]
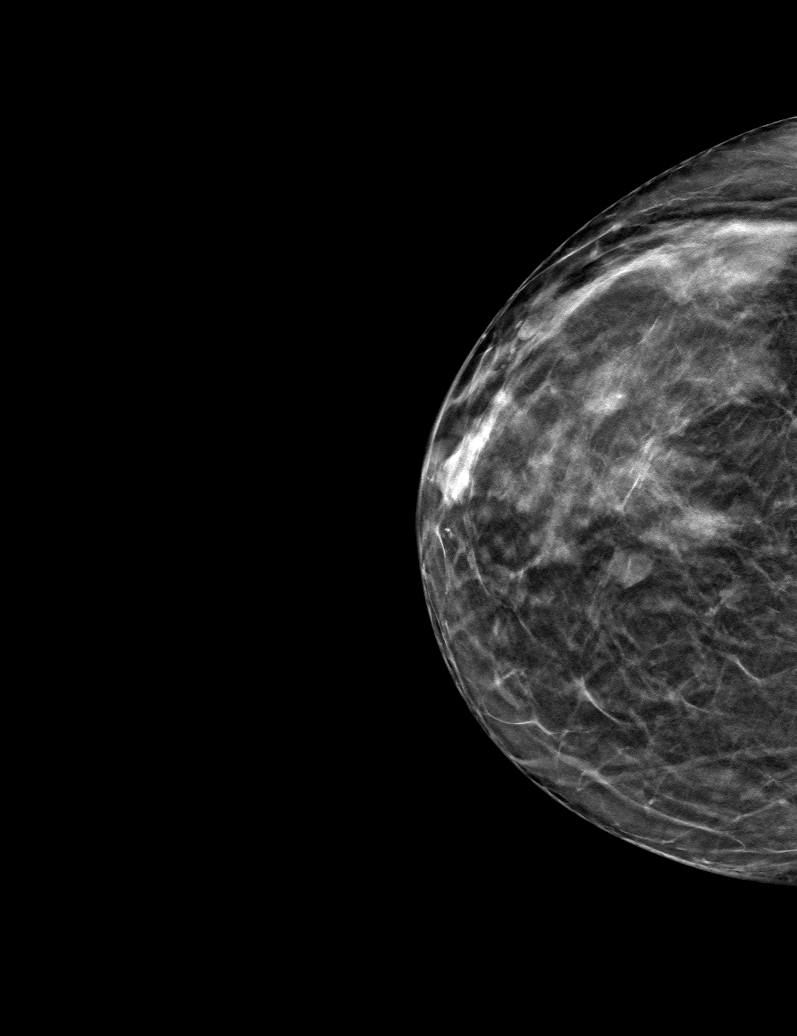

[6 of 17 positions shown; findings below may reference images not displayed]

ACR Breast Density Category c: The breast tissue is heterogeneously
dense, which may obscure small masses.
FINDINGS: In the left breast, a possible mass warrants further evaluation. In
the right breast, no findings suspicious for malignancy.

Images were processed with CAD.
IMPRESSION: Further evaluation is suggested for possible mass in the left
breast.

RECOMMENDATION:
Diagnostic mammogram and possibly ultrasound of the left breast.
(Code:[09])

The patient will be contacted regarding the findings, and additional
imaging will be scheduled.

BI-RADS CATEGORY  0: Incomplete. Need additional imaging evaluation
and/or prior mammograms for comparison.

ADDENDUM:
Correction to the original dictation, the possible mass of concern
is in the RIGHT breast. No suspicious findings are seen in the LEFT
breast.

*** End of Addendum ***

## 2018-04-27 ENCOUNTER — Other Ambulatory Visit: Payer: Self-pay | Admitting: Obstetrics

## 2018-04-27 DIAGNOSIS — R928 Other abnormal and inconclusive findings on diagnostic imaging of breast: Secondary | ICD-10-CM

## 2018-04-28 ENCOUNTER — Ambulatory Visit
Admission: RE | Admit: 2018-04-28 | Discharge: 2018-04-28 | Disposition: A | Discharge status: Home / Self Care | Payer: BLUE CROSS/BLUE SHIELD | Source: Ambulatory Visit | Attending: Obstetrics | Admitting: Obstetrics

## 2018-04-28 DIAGNOSIS — N6001 Solitary cyst of right breast: Secondary | ICD-10-CM | POA: Diagnosis not present

## 2018-04-28 DIAGNOSIS — R928 Other abnormal and inconclusive findings on diagnostic imaging of breast: Secondary | ICD-10-CM

## 2018-04-28 DIAGNOSIS — R922 Inconclusive mammogram: Secondary | ICD-10-CM | POA: Diagnosis not present

## 2018-04-28 IMAGING — MG DIGITAL DIAGNOSTIC UNILATERAL RIGHT MAMMOGRAM WITH TOMO AND CAD
3 series · 3 of 11 positions shown · non-contrast
Comparison: Previous exam(s).

CLINICAL DATA: Screening recall for mass seen in the central to
slightly inner right breast on the CC view only.

EXAM:
DIGITAL DIAGNOSTIC UNILATERAL RIGHT MAMMOGRAM WITH CAD AND TOMO
RIGHT BREAST ULTRASOUND

[R ML synth-2D]
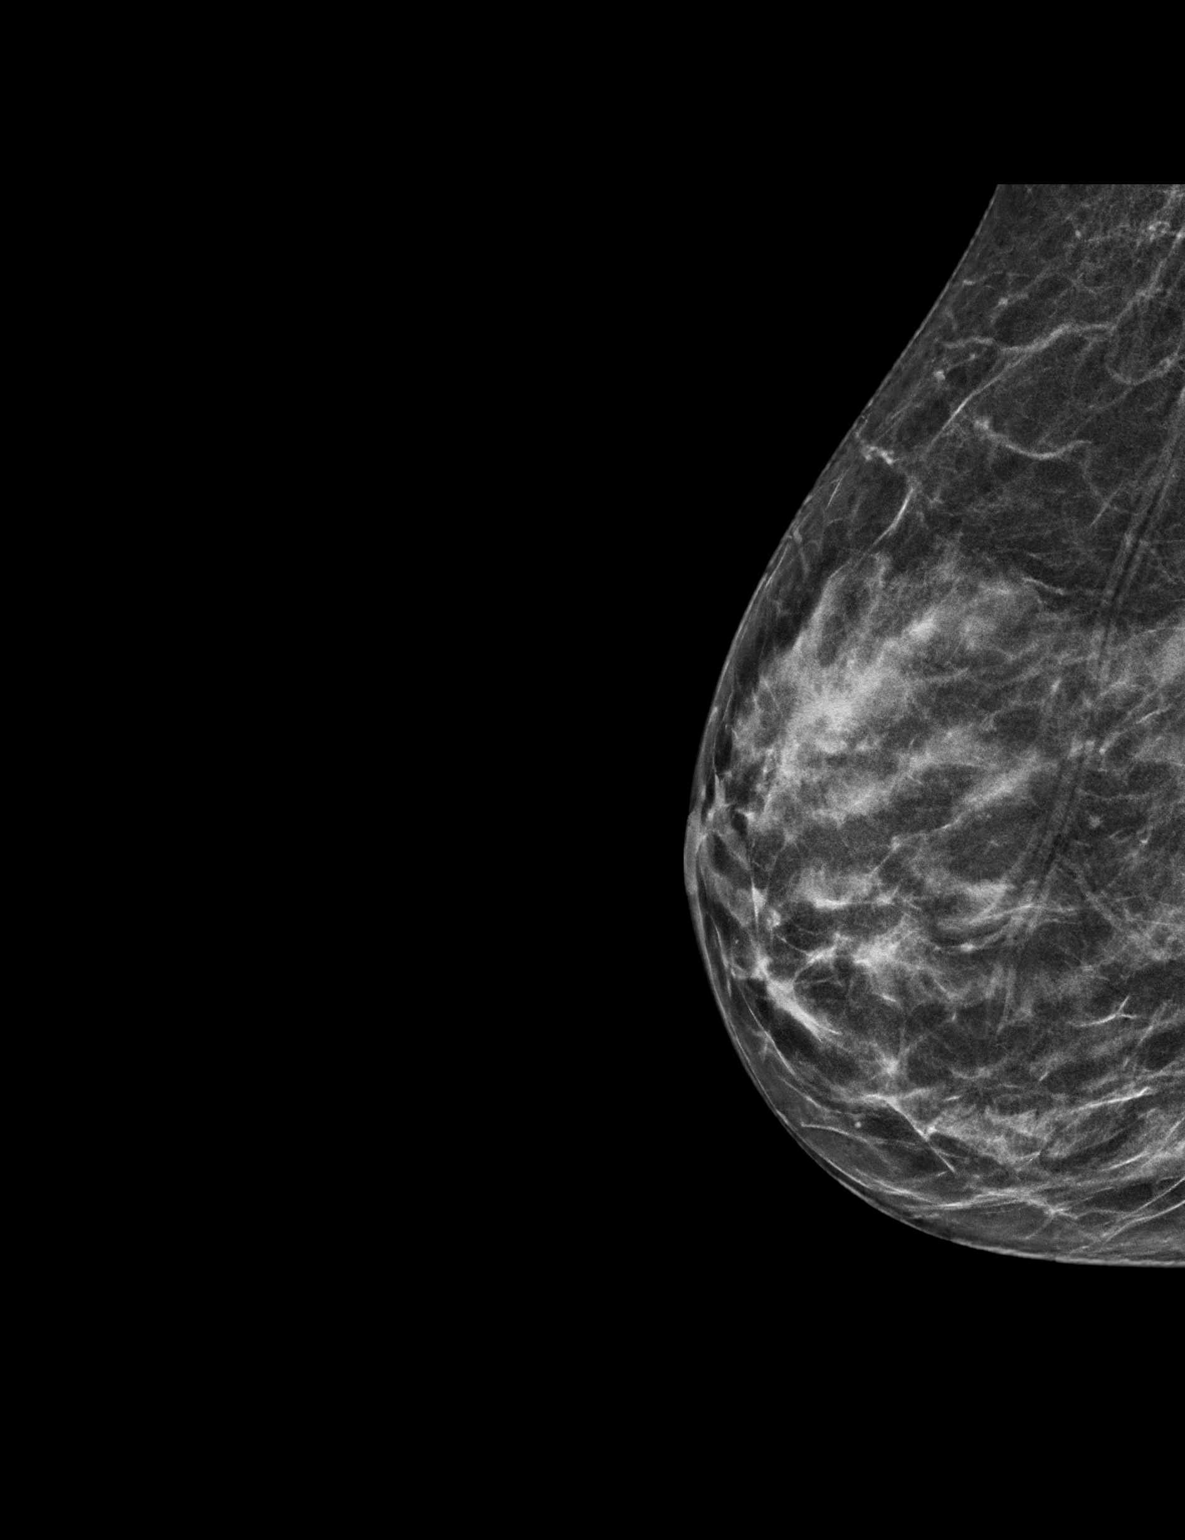

[R ML tomo · tomo slice 27/53.0]
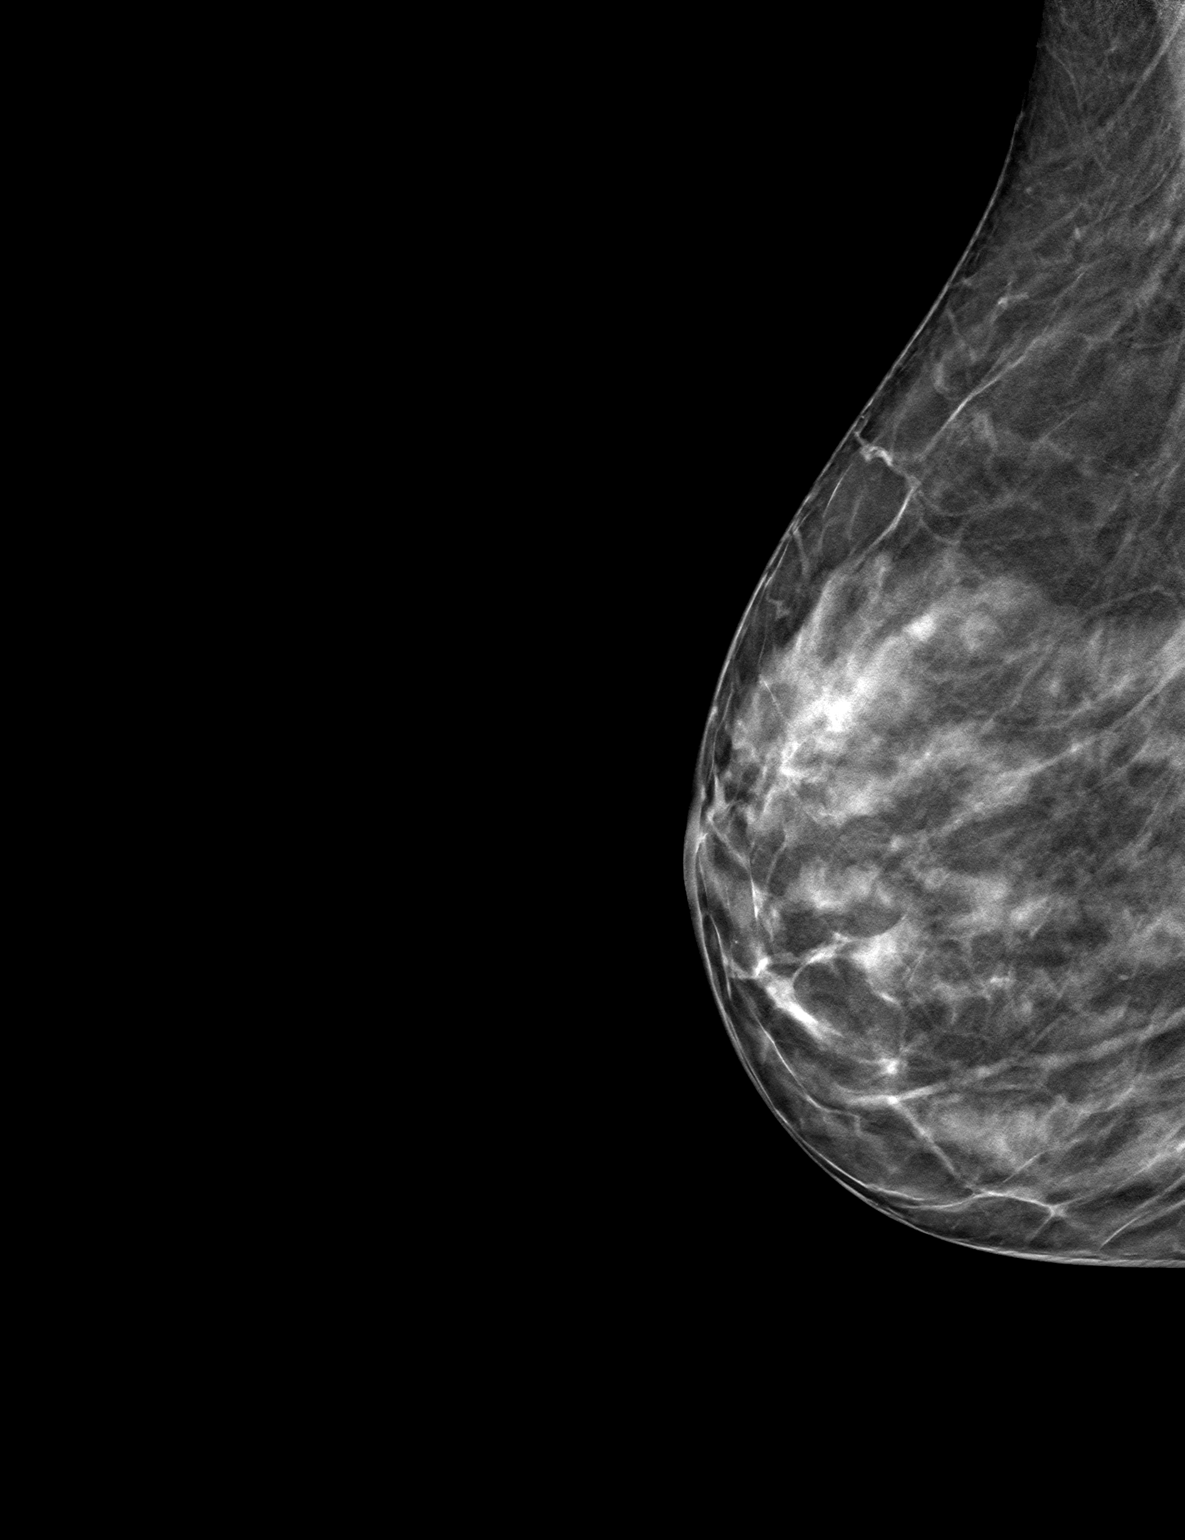

[R CC tomo · tomo slice 24/47.0]
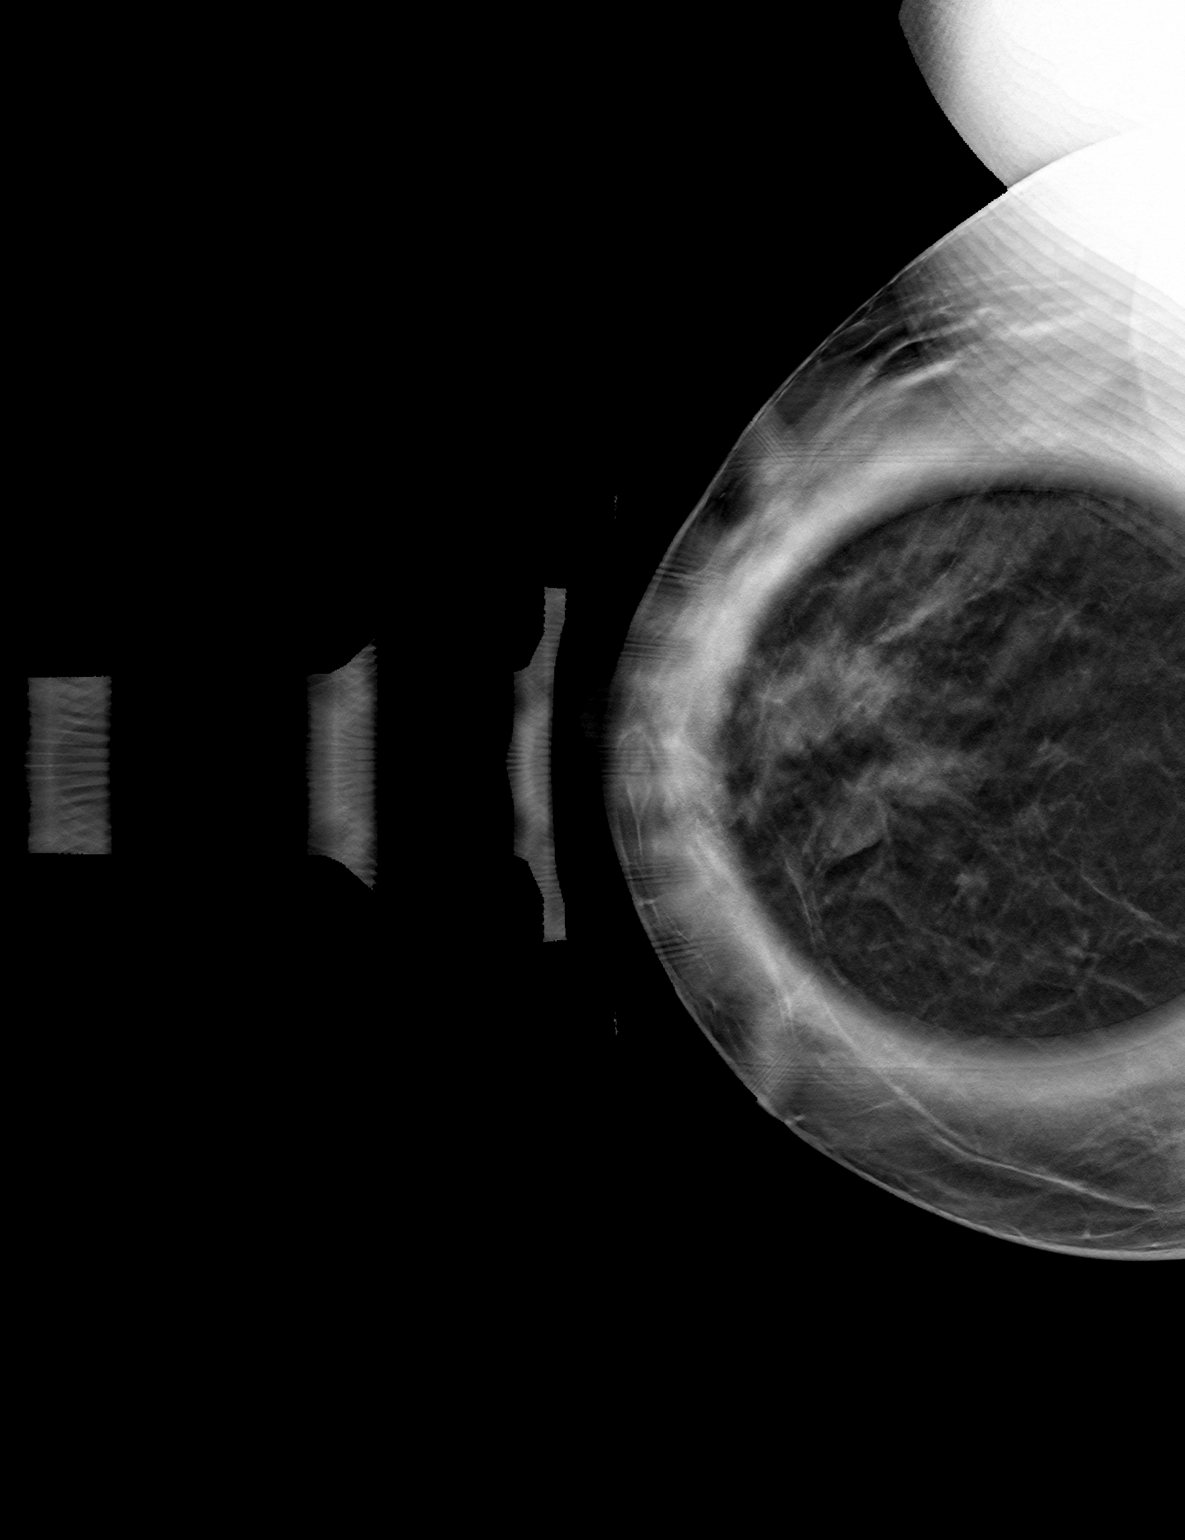

[3 of 11 positions shown; findings below may reference images not displayed]

ACR Breast Density Category c: The breast tissue is heterogeneously
dense, which may obscure small masses.
FINDINGS: Additional tomograms were performed of the right breast. There is an
oval circumscribed mass in the central to slightly inner right
breast measuring approximately 0.8 cm.

Mammographic images were processed with CAD.

Targeted ultrasound of the central to inner right breast was
performed. A simple cyst is seen at 2 o'clock retroareolar measuring
0.9 x 0.5 x 0.7 cm. This corresponds well with the mass seen in the
right breast at mammography.
IMPRESSION: Right breast cyst. There are no findings of malignancy in the right
breast.

RECOMMENDATION:
Recommend annual routine screening mammography, due [DATE].

I have discussed the findings and recommendations with the patient.
Results were also provided in writing at the conclusion of the
visit. If applicable, a reminder letter will be sent to the patient
regarding the next appointment.

BI-RADS CATEGORY  2: Benign.

## 2018-04-28 IMAGING — US ULTRASOUND RIGHT BREAST LIMITED
1 series · 5 of 5 positions shown · non-contrast
Comparison: Previous exam(s).

CLINICAL DATA: Screening recall for mass seen in the central to
slightly inner right breast on the CC view only.

EXAM:
DIGITAL DIAGNOSTIC UNILATERAL RIGHT MAMMOGRAM WITH CAD AND TOMO
RIGHT BREAST ULTRASOUND

[Series 1: ultrasound right breast limited · 0.06mm/px · 5 of 5 slices shown]
[im 1/5]
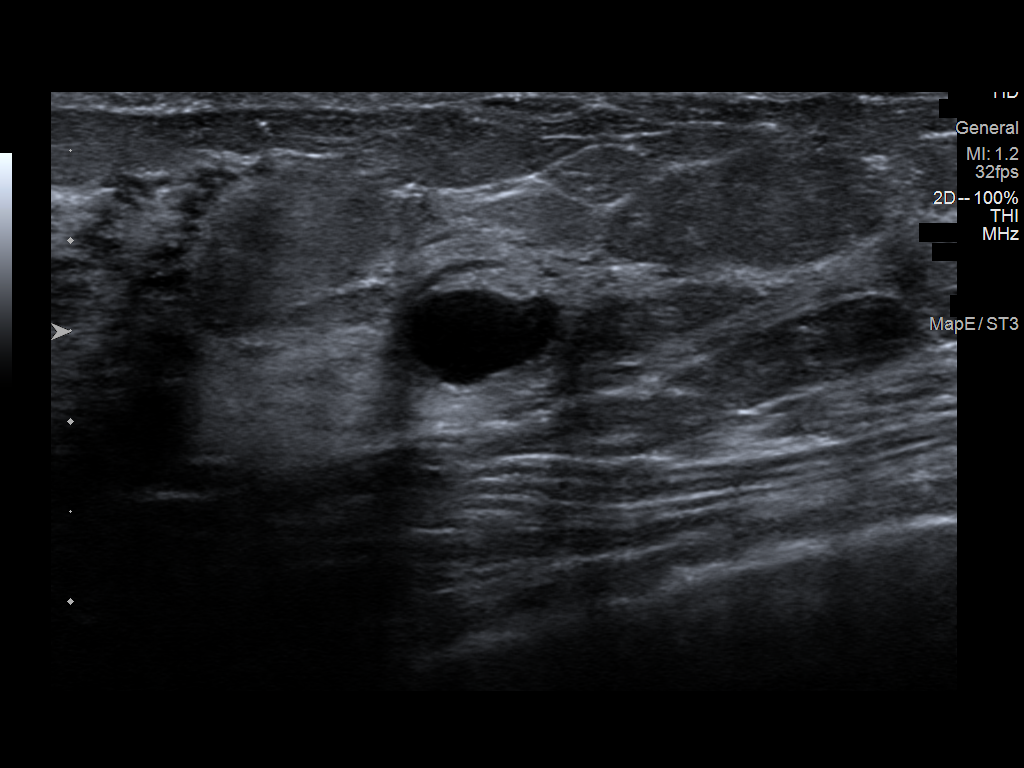
[im 2/5]
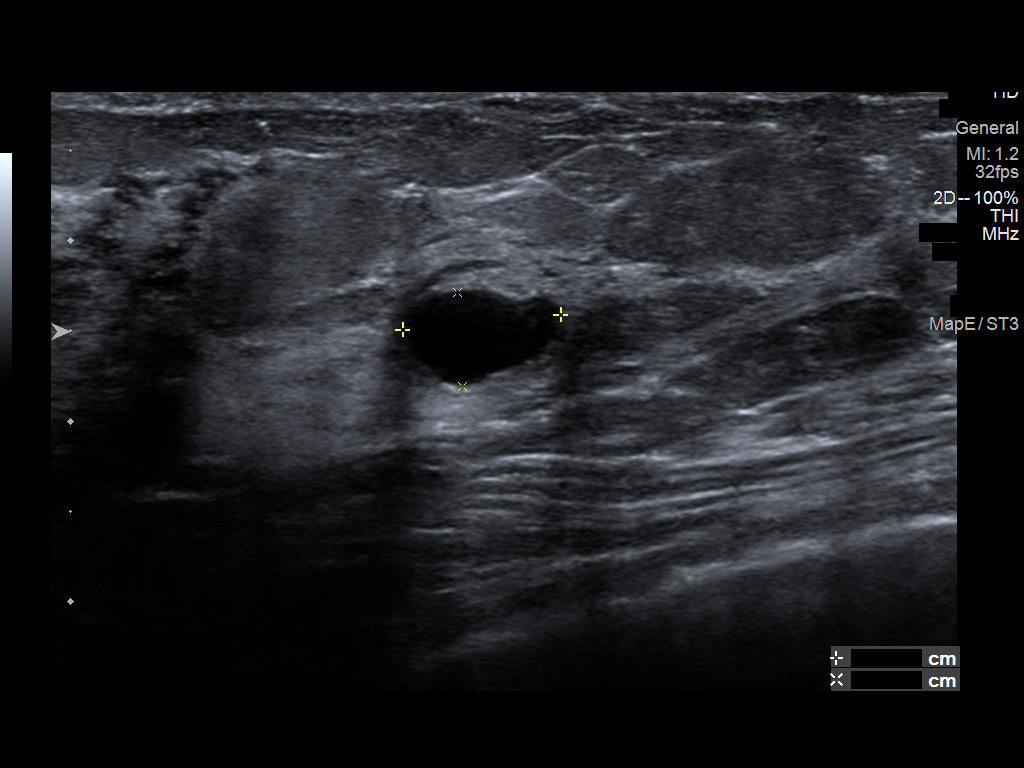
[im 3/5]
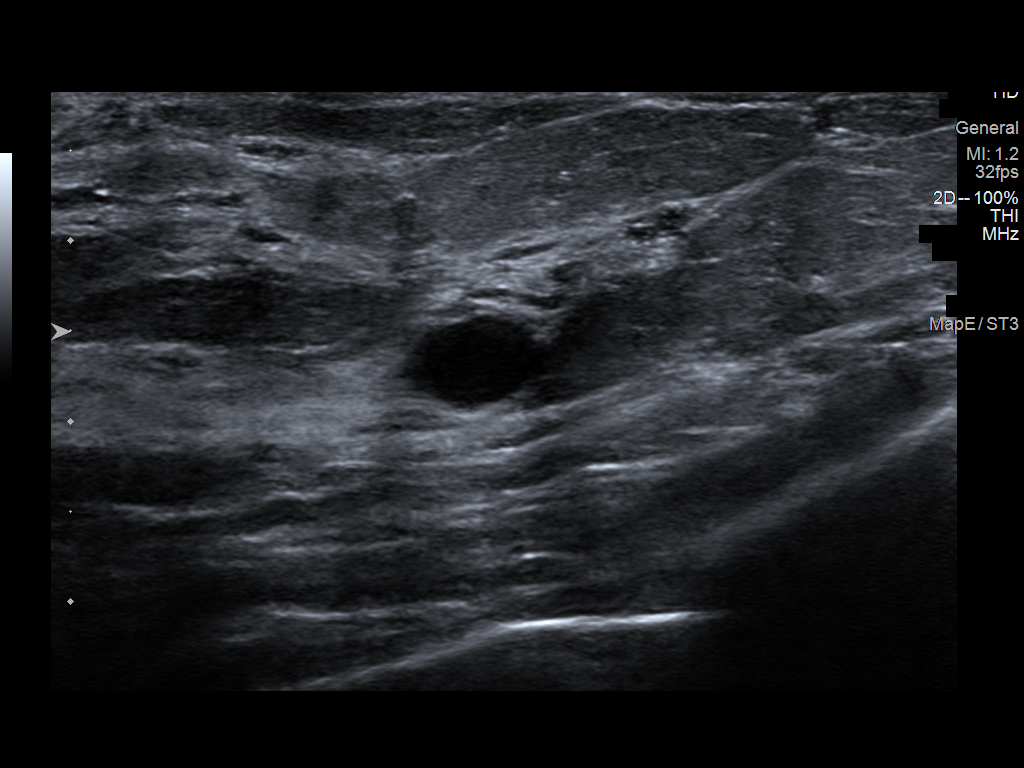
[im 4/5]
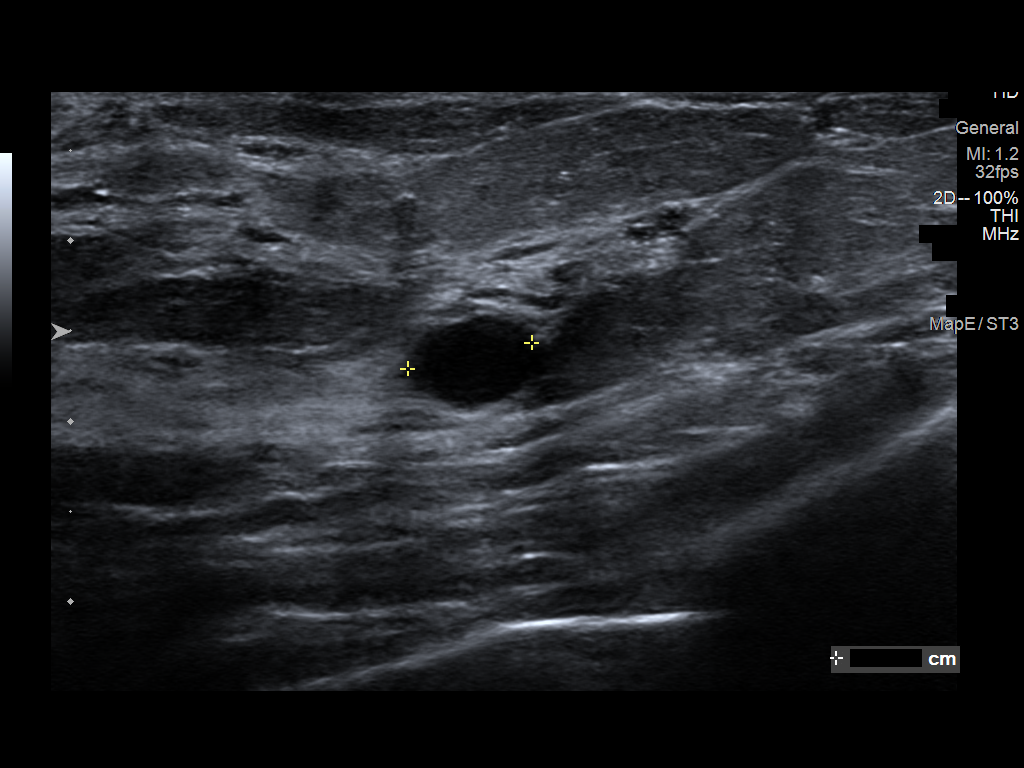
[im 5/5]
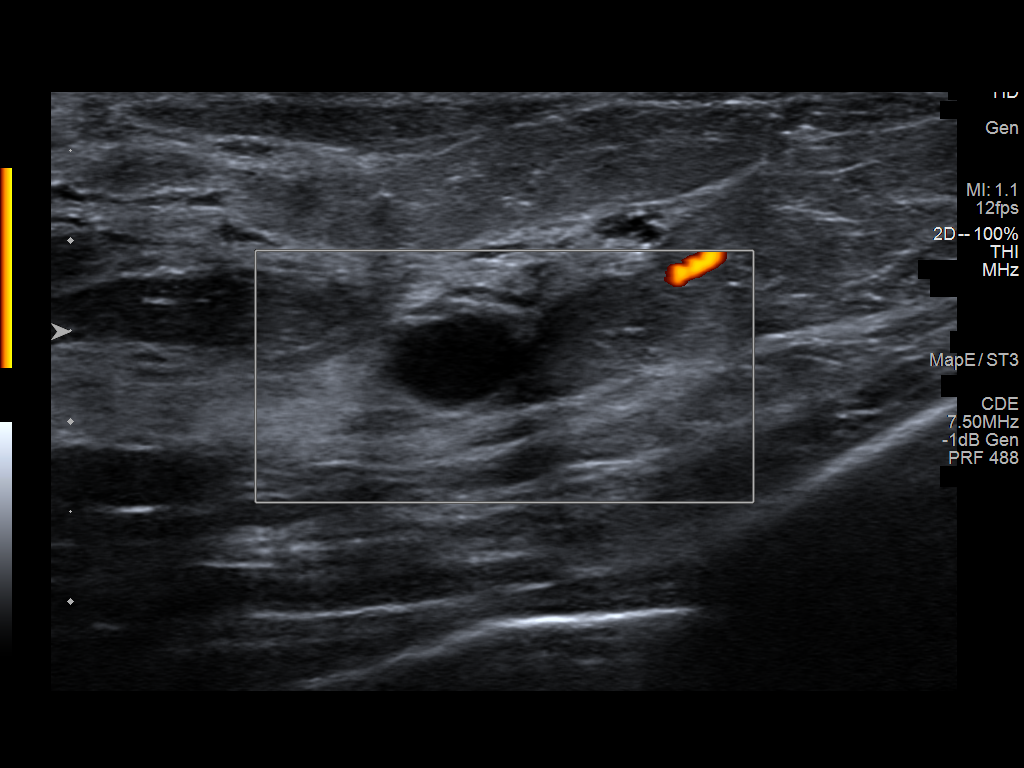

[5 of 5 positions shown; findings below may reference images not displayed]

ACR Breast Density Category c: The breast tissue is heterogeneously
dense, which may obscure small masses.
FINDINGS: Additional tomograms were performed of the right breast. There is an
oval circumscribed mass in the central to slightly inner right
breast measuring approximately 0.8 cm.

Mammographic images were processed with CAD.

Targeted ultrasound of the central to inner right breast was
performed. A simple cyst is seen at 2 o'clock retroareolar measuring
0.9 x 0.5 x 0.7 cm. This corresponds well with the mass seen in the
right breast at mammography.
IMPRESSION: Right breast cyst. There are no findings of malignancy in the right
breast.

RECOMMENDATION:
Recommend annual routine screening mammography, due [DATE].

I have discussed the findings and recommendations with the patient.
Results were also provided in writing at the conclusion of the
visit. If applicable, a reminder letter will be sent to the patient
regarding the next appointment.

BI-RADS CATEGORY  2: Benign.

## 2018-06-14 DIAGNOSIS — Z23 Encounter for immunization: Secondary | ICD-10-CM | POA: Diagnosis not present

## 2018-07-16 DIAGNOSIS — K30 Functional dyspepsia: Secondary | ICD-10-CM | POA: Diagnosis not present

## 2018-07-16 DIAGNOSIS — L298 Other pruritus: Secondary | ICD-10-CM | POA: Diagnosis not present

## 2018-07-16 DIAGNOSIS — R634 Abnormal weight loss: Secondary | ICD-10-CM | POA: Diagnosis not present

## 2018-07-19 DIAGNOSIS — R300 Dysuria: Secondary | ICD-10-CM | POA: Diagnosis not present

## 2018-07-19 DIAGNOSIS — K649 Unspecified hemorrhoids: Secondary | ICD-10-CM | POA: Diagnosis not present

## 2018-07-19 DIAGNOSIS — N898 Other specified noninflammatory disorders of vagina: Secondary | ICD-10-CM | POA: Diagnosis not present

## 2018-07-19 DIAGNOSIS — Z780 Asymptomatic menopausal state: Secondary | ICD-10-CM | POA: Diagnosis not present

## 2018-07-22 DIAGNOSIS — G47419 Narcolepsy without cataplexy: Secondary | ICD-10-CM | POA: Diagnosis not present

## 2018-07-22 DIAGNOSIS — G43009 Migraine without aura, not intractable, without status migrainosus: Secondary | ICD-10-CM | POA: Diagnosis not present

## 2018-08-03 DIAGNOSIS — I7300 Raynaud's syndrome without gangrene: Secondary | ICD-10-CM | POA: Diagnosis not present

## 2018-08-03 DIAGNOSIS — M545 Low back pain: Secondary | ICD-10-CM | POA: Diagnosis not present

## 2018-08-03 DIAGNOSIS — M150 Primary generalized (osteo)arthritis: Secondary | ICD-10-CM | POA: Diagnosis not present

## 2018-08-03 DIAGNOSIS — M359 Systemic involvement of connective tissue, unspecified: Secondary | ICD-10-CM | POA: Diagnosis not present

## 2018-09-28 DIAGNOSIS — Z01419 Encounter for gynecological examination (general) (routine) without abnormal findings: Secondary | ICD-10-CM | POA: Diagnosis not present

## 2018-09-28 DIAGNOSIS — Z6820 Body mass index (BMI) 20.0-20.9, adult: Secondary | ICD-10-CM | POA: Diagnosis not present

## 2018-09-30 DIAGNOSIS — Z79899 Other long term (current) drug therapy: Secondary | ICD-10-CM | POA: Diagnosis not present

## 2018-09-30 DIAGNOSIS — E559 Vitamin D deficiency, unspecified: Secondary | ICD-10-CM | POA: Diagnosis not present

## 2018-09-30 DIAGNOSIS — E7800 Pure hypercholesterolemia, unspecified: Secondary | ICD-10-CM | POA: Diagnosis not present

## 2018-09-30 DIAGNOSIS — N200 Calculus of kidney: Secondary | ICD-10-CM | POA: Diagnosis not present

## 2018-10-06 DIAGNOSIS — E7800 Pure hypercholesterolemia, unspecified: Secondary | ICD-10-CM | POA: Diagnosis not present

## 2018-10-06 DIAGNOSIS — Z79899 Other long term (current) drug therapy: Secondary | ICD-10-CM | POA: Diagnosis not present

## 2018-10-06 DIAGNOSIS — E559 Vitamin D deficiency, unspecified: Secondary | ICD-10-CM | POA: Diagnosis not present

## 2018-10-06 DIAGNOSIS — D709 Neutropenia, unspecified: Secondary | ICD-10-CM | POA: Diagnosis not present

## 2018-10-06 DIAGNOSIS — E038 Other specified hypothyroidism: Secondary | ICD-10-CM | POA: Diagnosis not present

## 2018-10-28 DIAGNOSIS — N900 Mild vulvar dysplasia: Secondary | ICD-10-CM | POA: Diagnosis not present

## 2018-10-28 DIAGNOSIS — N9089 Other specified noninflammatory disorders of vulva and perineum: Secondary | ICD-10-CM | POA: Diagnosis not present

## 2018-11-01 DIAGNOSIS — Z79899 Other long term (current) drug therapy: Secondary | ICD-10-CM | POA: Diagnosis not present

## 2018-11-01 DIAGNOSIS — M329 Systemic lupus erythematosus, unspecified: Secondary | ICD-10-CM | POA: Diagnosis not present

## 2018-11-02 DIAGNOSIS — N900 Mild vulvar dysplasia: Secondary | ICD-10-CM | POA: Diagnosis not present

## 2018-11-24 DIAGNOSIS — G245 Blepharospasm: Secondary | ICD-10-CM | POA: Diagnosis not present

## 2018-11-24 DIAGNOSIS — H57819 Brow ptosis, unspecified: Secondary | ICD-10-CM | POA: Diagnosis not present

## 2018-12-10 DIAGNOSIS — R509 Fever, unspecified: Secondary | ICD-10-CM | POA: Diagnosis not present

## 2018-12-14 DIAGNOSIS — J069 Acute upper respiratory infection, unspecified: Secondary | ICD-10-CM | POA: Diagnosis not present

## 2018-12-14 DIAGNOSIS — E039 Hypothyroidism, unspecified: Secondary | ICD-10-CM | POA: Diagnosis not present

## 2018-12-21 DIAGNOSIS — G245 Blepharospasm: Secondary | ICD-10-CM | POA: Diagnosis not present

## 2018-12-22 DIAGNOSIS — E559 Vitamin D deficiency, unspecified: Secondary | ICD-10-CM | POA: Diagnosis not present

## 2018-12-22 DIAGNOSIS — E039 Hypothyroidism, unspecified: Secondary | ICD-10-CM | POA: Diagnosis not present

## 2018-12-22 DIAGNOSIS — D709 Neutropenia, unspecified: Secondary | ICD-10-CM | POA: Diagnosis not present

## 2018-12-29 DIAGNOSIS — N900 Mild vulvar dysplasia: Secondary | ICD-10-CM | POA: Diagnosis not present

## 2019-01-04 ENCOUNTER — Telehealth: Payer: Self-pay | Admitting: *Deleted

## 2019-01-04 NOTE — Telephone Encounter (Signed)
Spoke with the patient and scheduled an appt for 8/20

## 2019-01-06 DIAGNOSIS — H524 Presbyopia: Secondary | ICD-10-CM | POA: Diagnosis not present

## 2019-01-13 ENCOUNTER — Encounter: Payer: Self-pay | Admitting: Gynecologic Oncology

## 2019-01-13 ENCOUNTER — Inpatient Hospital Stay: Payer: BLUE CROSS/BLUE SHIELD | Attending: Gynecologic Oncology | Admitting: Gynecologic Oncology

## 2019-01-13 ENCOUNTER — Other Ambulatory Visit: Payer: Self-pay

## 2019-01-13 VITALS — BP 125/69 | HR 66 | Temp 99.1°F | Resp 18 | Ht 65.0 in | Wt 126.3 lb

## 2019-01-13 DIAGNOSIS — R400 Somnolence: Secondary | ICD-10-CM | POA: Diagnosis not present

## 2019-01-13 DIAGNOSIS — M329 Systemic lupus erythematosus, unspecified: Secondary | ICD-10-CM | POA: Diagnosis not present

## 2019-01-13 DIAGNOSIS — M1990 Unspecified osteoarthritis, unspecified site: Secondary | ICD-10-CM | POA: Diagnosis not present

## 2019-01-13 DIAGNOSIS — M8580 Other specified disorders of bone density and structure, unspecified site: Secondary | ICD-10-CM | POA: Insufficient documentation

## 2019-01-13 DIAGNOSIS — I7300 Raynaud's syndrome without gangrene: Secondary | ICD-10-CM | POA: Diagnosis not present

## 2019-01-13 DIAGNOSIS — Z7982 Long term (current) use of aspirin: Secondary | ICD-10-CM | POA: Diagnosis not present

## 2019-01-13 DIAGNOSIS — L299 Pruritus, unspecified: Secondary | ICD-10-CM | POA: Diagnosis not present

## 2019-01-13 DIAGNOSIS — N900 Mild vulvar dysplasia: Secondary | ICD-10-CM | POA: Diagnosis not present

## 2019-01-13 DIAGNOSIS — Z79899 Other long term (current) drug therapy: Secondary | ICD-10-CM | POA: Diagnosis not present

## 2019-01-13 NOTE — Patient Instructions (Addendum)
Plan to have CO2 laser of the vulva at the [facility] on September 10 with Dr. Skeet Latch.  You will receive a phone call from the pre-surgical RN to discuss instructions.  Please call for any questions or concerns.  You will also have COVID testing prior to your procedure.  Thank you very much [name] for allowing me to provide care for you today.  I appreciate your confidence in choosing our Gynecologic Oncology team.  If you have any questions about your visit today please call our office and we will get back to you as soon as possible.  Please consider using the website Medlineplus.gov as an Geneticist, molecular.  [name]. Fouad Taul MD., PhD Gynecologic Oncology

## 2019-01-13 NOTE — H&P (View-Only) (Signed)
Consult Note: Gyn-Onc  Consult was requested by Dr.Fogleman for the evaluation of Cindy Fitzgerald 58 y.o. female  CC:  Chief Complaint  Patient presents with  . Cancer    VIN I (vulvar intraepithelial neoplasia I)    Assessment/Plan:  [name] is a 58 y.o. with symptomatic VIN 1 status post Aldara for 8 weeks without significant changes.  Majority of these lesions will regress on their own within 2 years however given the reported pruritus may not be unreasonable to ablate the area.  Acetic acid was applied in the clinic and the small area of the white changes was noted in the superior perineum and just outside the left aspect of the introitus.  She was counseled regarding discomfort after the procedure and also about the likelihood that this may recur.  HPI: [name] is a 58 y.o. who reports vulvar pruritus for approximately 1 year.  Lesion was identified on 10/28/2018 and biopsied was consistent with VIN 1.  She was treated with Aldara for 8 weeks and reassessment was notable for absence of any improvement.  Patient requests ablation as the pruritus is continued..  Review of Systems: Review of Systems  Constitutional: Negative for appetite change, chills, diaphoresis and fatigue.  HENT:   Negative for hearing loss and lump/mass.   Eyes: Negative for eye problems.  Respiratory: Negative for chest tightness, hemoptysis and shortness of breath.   Cardiovascular: Negative for chest pain.  Gastrointestinal: Negative.   Genitourinary: Negative for dyspareunia, dysuria, frequency and nocturia.        Pruritus of the left inferior vulva  Musculoskeletal: Negative.   Skin: Negative.   Psychiatric/Behavioral: Negative.      Current Meds:  Outpatient Encounter Medications as of 01/13/2019  Medication Sig  . aspirin 81 MG tablet Take 81 mg by mouth daily.    . Calcium Carbonate-Vit D-Min (CALCIUM 1200 PO) Take 1,500 mg by mouth daily.  . cetirizine (ZYRTEC) 10 MG  tablet Take 10 mg by mouth daily.  . cholecalciferol (VITAMIN D) 1000 UNITS tablet Take 1,000 Units by mouth daily.    Eduard Roux (AIMOVIG, 140 MG DOSE, [place]) Inject 140 mg into the skin every 30 (thirty) days.  Marland Kitchen estradiol (VIVELLE-DOT) 0.0375 MG/24HR Place 1 patch onto the skin 2 (two) times a week.  . hydroxychloroquine (PLAQUENIL) 200 MG tablet Take 100 mg by mouth daily.    Marland Kitchen ibuprofen (ADVIL,MOTRIN) 600 MG tablet Take 1 tablet (600 mg total) by mouth every 8 (eight) hours.  . Lactobacillus Rhamnosus, GG, (CULTURELLE PO) Take by mouth.  . levothyroxine (SYNTHROID) 25 MCG tablet Take 25 mcg by mouth daily before breakfast.  . magnesium oxide (MAG-OX) 400 MG tablet Take 400 mg by mouth daily.    . modafinil (PROVIGIL) 100 MG tablet Take 2 tablets (200 mg total) by mouth daily.  . ondansetron (ZOFRAN ODT) 4 MG disintegrating tablet Take 1 tablet (4 mg total) by mouth every 8 (eight) hours as needed.  Marland Kitchen oxyCODONE-acetaminophen (ROXICET) 5-325 MG tablet Take 1-2 tablets by mouth every 8 (eight) hours as needed for severe pain.  Marland Kitchen Potassium Citrate 15 MEQ (1620 MG) TBCR   . tamsulosin (FLOMAX) 0.4 MG CAPS capsule Take 1 capsule (0.4 mg total) by mouth daily.  . Topiramate (TROKENDI XR PO) Take 400 mg by mouth.  . zonisamide (ZONEGRAN) 100 MG capsule Take 400 mg by mouth daily.   . [DISCONTINUED] HYDROcodone-acetaminophen (NORCO/VICODIN) 5-325 MG tablet Take 1-2 tablets by mouth every 6 (  six) hours as needed for moderate pain.   No facility-administered encounter medications on file as of 01/13/2019.     Allergy:  Allergies  Allergen Reactions  . Lamisil  [Terbinafine] Rash  . Adhesive [Tape]     Rash  . Codeine     GI upset  . Other     Steri-strips  . Latex Rash  . Penicillins Cross Reactors Rash    Social Hx:   Social History   Socioeconomic History  . Marital status: Married    Spouse name: Not on file  . Number of children: Not on file  . Years of education: Not on file   . Highest education level: Not on file  Occupational History  . Not on file  Social Needs  . Financial resource strain: Not on file  . Food insecurity    Worry: Not on file    Inability: Not on file  . Transportation needs    Medical: Not on file    Non-medical: Not on file  Tobacco Use  . Smoking status: Never Smoker  . Smokeless tobacco: Never Used  Substance and Sexual Activity  . Alcohol use: No  . Drug use: No  . Sexual activity: Not on file  Lifestyle  . Physical activity    Days per week: Not on file    Minutes per session: Not on file  . Stress: Not on file  Relationships  . Social Musicianconnections    Talks on phone: Not on file    Gets together: Not on file    Attends religious service: Not on file    Active member of club or organization: Not on file    Attends meetings of clubs or organizations: Not on file    Relationship status: Not on file  . Intimate partner violence    Fear of current or ex partner: Not on file    Emotionally abused: Not on file    Physically abused: Not on file    Forced sexual activity: Not on file  Other Topics Concern  . Not on file  Social History Narrative  . Not on file    Past Surgical Hx:  Past Surgical History:  Procedure Laterality Date  . ABDOMINAL HYSTERECTOMY      Past Medical Hx:  Past Medical History:  Diagnosis Date  . Arthritis   . Hypersomnolence   . Kidney stones   . Lupus (HCC)   . Lupus (HCC)   . Migraine   . Migraines   . Narcolepsy   . Osteopenia   . Raynaud's disease   . Sleep apnea    no cpap  . Vitamin deficiency    low d    Past Gynecological History:Menarche 15 regular menses until hysterectomy for endometriosis 1997. No h/o abnormal pap.    Family Hx:  Family History  Problem Relation Age of Onset  . Asthma Mother   . Hypertension Mother   . CVA Mother   . Breast cancer Mother   . Heart disease Father   . Stroke Father   . Macular degeneration Father   . Diabetes type II Father    . Kidney failure Father   . Heart disease Maternal Grandfather   . Heart disease Maternal Grandmother   . Heart disease Paternal Grandfather   . Cancer Paternal Grandmother     Vitals:  Blood pressure 125/69, pulse 66, temperature 99.1 F (37.3 C), temperature source Temporal, resp. rate 18, height 5\' 5"  (1.651 m), weight  126 lb 4.8 oz (57.3 kg), SpO2 100 %.  Physical Exam: Physical Exam  Constitutional: She is oriented to person, place, and time. She appears well-developed and well-nourished. No distress.  Eyes: Pupils are equal, round, and reactive to light.  Neck: Normal range of motion. Neck supple. No JVD present. No tracheal deviation present.  Cardiovascular: Normal rate and regular rhythm.  No murmur heard. Respiratory: Effort normal and breath sounds normal. No stridor. No respiratory distress. She has no wheezes. She has no rales.  GI: Soft. Bowel sounds are normal. She exhibits no distension and no mass. There is no abdominal tenderness. There is no rebound and no guarding.  Genitourinary:    Vagina normal.     There is no rash, tenderness or lesion on the right labia.    Genitourinary Comments: Vulvar colposcopy, acetowhite changes on the superiormost aspect of the perineum/inferior left labia and immediately distal to the hymenal ring there was some patchy areas of acetowhite changes   Musculoskeletal: Normal range of motion.        General: No deformity or edema.  Lymphadenopathy:    She has no cervical adenopathy.       Right: No inguinal adenopathy present.       Left: No inguinal adenopathy present.  Neurological: She is alert and oriented to person, place, and time.  Skin: Skin is warm and dry.  Psychiatric: She has a normal mood and affect.

## 2019-01-13 NOTE — Progress Notes (Signed)
Consult Note: Gyn-Onc  Consult was requested by Dr.Fogleman for the evaluation of Cindy Fitzgerald 58 y.o. female  CC:  Chief Complaint  Patient presents with  . Cancer    VIN I (vulvar intraepithelial neoplasia I)    Assessment/Plan:  Cindy Fitzgerald is a 58 y.o. with symptomatic VIN 1 status post Aldara for 8 weeks without significant changes.  Majority of these lesions will regress on their own within 2 years however given the reported pruritus may not be unreasonable to ablate the area.  Acetic acid was applied in the clinic and the small area of the white changes was noted in the superior perineum and just outside the left aspect of the introitus.  She was counseled regarding discomfort after the procedure and also about the likelihood that this may recur.  HPI: Cindy Fitzgerald is a 58 y.o. who reports vulvar pruritus for approximately 1 year.  Lesion was identified on 10/28/2018 and biopsied was consistent with VIN 1.  She was treated with Aldara for 8 weeks and reassessment was notable for absence of any improvement.  Patient requests ablation as the pruritus is continued..  Review of Systems: Review of Systems  Constitutional: Negative for appetite change, chills, diaphoresis and fatigue.  HENT:   Negative for hearing loss and lump/mass.   Eyes: Negative for eye problems.  Respiratory: Negative for chest tightness, hemoptysis and shortness of breath.   Cardiovascular: Negative for chest pain.  Gastrointestinal: Negative.   Genitourinary: Negative for dyspareunia, dysuria, frequency and nocturia.        Pruritus of the left inferior vulva  Musculoskeletal: Negative.   Skin: Negative.   Psychiatric/Behavioral: Negative.      Current Meds:  Outpatient Encounter Medications as of 01/13/2019  Medication Sig  . aspirin 81 MG tablet Take 81 mg by mouth daily.    . Calcium Carbonate-Vit D-Min (CALCIUM 1200 PO) Take 1,500 mg by mouth daily.  . cetirizine (ZYRTEC) 10 MG  tablet Take 10 mg by mouth daily.  . cholecalciferol (VITAMIN D) 1000 UNITS tablet Take 1,000 Units by mouth daily.    Eduard Roux (AIMOVIG, 140 MG DOSE, [place]) Inject 140 mg into the skin every 30 (thirty) days.  Marland Kitchen estradiol (VIVELLE-DOT) 0.0375 MG/24HR Place 1 patch onto the skin 2 (two) times a week.  . hydroxychloroquine (PLAQUENIL) 200 MG tablet Take 100 mg by mouth daily.    Marland Kitchen ibuprofen (ADVIL,MOTRIN) 600 MG tablet Take 1 tablet (600 mg total) by mouth every 8 (eight) hours.  . Lactobacillus Rhamnosus, GG, (CULTURELLE PO) Take by mouth.  . levothyroxine (SYNTHROID) 25 MCG tablet Take 25 mcg by mouth daily before breakfast.  . magnesium oxide (MAG-OX) 400 MG tablet Take 400 mg by mouth daily.    . modafinil (PROVIGIL) 100 MG tablet Take 2 tablets (200 mg total) by mouth daily.  . ondansetron (ZOFRAN ODT) 4 MG disintegrating tablet Take 1 tablet (4 mg total) by mouth every 8 (eight) hours as needed.  Marland Kitchen oxyCODONE-acetaminophen (ROXICET) 5-325 MG tablet Take 1-2 tablets by mouth every 8 (eight) hours as needed for severe pain.  Marland Kitchen Potassium Citrate 15 MEQ (1620 MG) TBCR   . tamsulosin (FLOMAX) 0.4 MG CAPS capsule Take 1 capsule (0.4 mg total) by mouth daily.  . Topiramate (TROKENDI XR PO) Take 400 mg by mouth.  . zonisamide (ZONEGRAN) 100 MG capsule Take 400 mg by mouth daily.   . [DISCONTINUED] HYDROcodone-acetaminophen (NORCO/VICODIN) 5-325 MG tablet Take 1-2 tablets by mouth every 6 (  six) hours as needed for moderate pain.   No facility-administered encounter medications on file as of 01/13/2019.     Allergy:  Allergies  Allergen Reactions  . Lamisil  [Terbinafine] Rash  . Adhesive [Tape]     Rash  . Codeine     GI upset  . Other     Steri-strips  . Latex Rash  . Penicillins Cross Reactors Rash    Social Hx:   Social History   Socioeconomic History  . Marital status: Married    Spouse name: Not on file  . Number of children: Not on file  . Years of education: Not on file   . Highest education level: Not on file  Occupational History  . Not on file  Social Needs  . Financial resource strain: Not on file  . Food insecurity    Worry: Not on file    Inability: Not on file  . Transportation needs    Medical: Not on file    Non-medical: Not on file  Tobacco Use  . Smoking status: Never Smoker  . Smokeless tobacco: Never Used  Substance and Sexual Activity  . Alcohol use: No  . Drug use: No  . Sexual activity: Not on file  Lifestyle  . Physical activity    Days per week: Not on file    Minutes per session: Not on file  . Stress: Not on file  Relationships  . Social connections    Talks on phone: Not on file    Gets together: Not on file    Attends religious service: Not on file    Active member of club or organization: Not on file    Attends meetings of clubs or organizations: Not on file    Relationship status: Not on file  . Intimate partner violence    Fear of current or ex partner: Not on file    Emotionally abused: Not on file    Physically abused: Not on file    Forced sexual activity: Not on file  Other Topics Concern  . Not on file  Social History Narrative  . Not on file    Past Surgical Hx:  Past Surgical History:  Procedure Laterality Date  . ABDOMINAL HYSTERECTOMY      Past Medical Hx:  Past Medical History:  Diagnosis Date  . Arthritis   . Hypersomnolence   . Kidney stones   . Lupus (HCC)   . Lupus (HCC)   . Migraine   . Migraines   . Narcolepsy   . Osteopenia   . Raynaud's disease   . Sleep apnea    no cpap  . Vitamin deficiency    low d    Past Gynecological History:Menarche 15 regular menses until hysterectomy for endometriosis 1997. No h/o abnormal pap.    Family Hx:  Family History  Problem Relation Age of Onset  . Asthma Mother   . Hypertension Mother   . CVA Mother   . Breast cancer Mother   . Heart disease Father   . Stroke Father   . Macular degeneration Father   . Diabetes type II Father    . Kidney failure Father   . Heart disease Maternal Grandfather   . Heart disease Maternal Grandmother   . Heart disease Paternal Grandfather   . Cancer Paternal Grandmother     Vitals:  Blood pressure 125/69, pulse 66, temperature 99.1 F (37.3 C), temperature source Temporal, resp. rate 18, height 5' 5" (1.651 m), weight   126 lb 4.8 oz (57.3 kg), SpO2 100 %.  Physical Exam: Physical Exam  Constitutional: She is oriented to person, place, and time. She appears well-developed and well-nourished. No distress.  Eyes: Pupils are equal, round, and reactive to light.  Neck: Normal range of motion. Neck supple. No JVD present. No tracheal deviation present.  Cardiovascular: Normal rate and regular rhythm.  No murmur heard. Respiratory: Effort normal and breath sounds normal. No stridor. No respiratory distress. She has no wheezes. She has no rales.  GI: Soft. Bowel sounds are normal. She exhibits no distension and no mass. There is no abdominal tenderness. There is no rebound and no guarding.  Genitourinary:    Vagina normal.     There is no rash, tenderness or lesion on the right labia.    Genitourinary Comments: Vulvar colposcopy, acetowhite changes on the superiormost aspect of the perineum/inferior left labia and immediately distal to the hymenal ring there was some patchy areas of acetowhite changes   Musculoskeletal: Normal range of motion.        General: No deformity or edema.  Lymphadenopathy:    She has no cervical adenopathy.       Right: No inguinal adenopathy present.       Left: No inguinal adenopathy present.  Neurological: She is alert and oriented to person, place, and time.  Skin: Skin is warm and dry.  Psychiatric: She has a normal mood and affect.

## 2019-01-24 DIAGNOSIS — G47419 Narcolepsy without cataplexy: Secondary | ICD-10-CM | POA: Diagnosis not present

## 2019-01-24 DIAGNOSIS — G43009 Migraine without aura, not intractable, without status migrainosus: Secondary | ICD-10-CM | POA: Diagnosis not present

## 2019-01-26 ENCOUNTER — Telehealth: Payer: Self-pay | Admitting: *Deleted

## 2019-01-26 NOTE — Telephone Encounter (Signed)
Returned the patient's call regarding her pre op appt and covid test. Explained that the pre-op department will call her either today or tomorrow with appts. Patient asked about what to except after surgery. Explained that after surgery she will be fine the next day to resume activities. Reminded her to get a pool ring to sit on, to get frozen peas with snack bags, and to wear skirts/dresses at home with no underwear.

## 2019-02-01 ENCOUNTER — Other Ambulatory Visit (HOSPITAL_COMMUNITY)
Admission: RE | Admit: 2019-02-01 | Discharge: 2019-02-01 | Disposition: A | Discharge status: Home / Self Care | Payer: BLUE CROSS/BLUE SHIELD | Source: Ambulatory Visit | Attending: Gynecologic Oncology | Admitting: Gynecologic Oncology

## 2019-02-01 DIAGNOSIS — Z20828 Contact with and (suspected) exposure to other viral communicable diseases: Secondary | ICD-10-CM | POA: Insufficient documentation

## 2019-02-01 DIAGNOSIS — Z01812 Encounter for preprocedural laboratory examination: Secondary | ICD-10-CM | POA: Diagnosis not present

## 2019-02-02 ENCOUNTER — Encounter (HOSPITAL_BASED_OUTPATIENT_CLINIC_OR_DEPARTMENT_OTHER): Payer: Self-pay | Admitting: *Deleted

## 2019-02-02 ENCOUNTER — Other Ambulatory Visit: Payer: Self-pay

## 2019-02-02 LAB — SARS CORONAVIRUS 2 (TAT 6-24 HRS): SARS Coronavirus 2: NEGATIVE

## 2019-02-02 NOTE — Anesthesia Preprocedure Evaluation (Addendum)
Anesthesia Evaluation  Patient identified by MRN, date of birth, ID band Patient awake    Reviewed: Allergy & Precautions, NPO status , Patient's Chart, lab work & pertinent test results  History of Anesthesia Complications (+) PONV  Airway Mallampati: II  TM Distance: >3 FB     Dental   Pulmonary    breath sounds clear to auscultation       Cardiovascular negative cardio ROS   Rhythm:Regular Rate:Normal     Neuro/Psych  Headaches,    GI/Hepatic negative GI ROS, Neg liver ROS,   Endo/Other  Hypothyroidism   Renal/GU negative Renal ROS     Musculoskeletal   Abdominal   Peds  Hematology   Anesthesia Other Findings   Reproductive/Obstetrics                           Anesthesia Physical Anesthesia Plan  ASA: III  Anesthesia Plan: General   Post-op Pain Management:    Induction: Intravenous  PONV Risk Score and Plan: 4 or greater and Ondansetron, Dexamethasone and Midazolam  Airway Management Planned: LMA  Additional Equipment:   Intra-op Plan:   Post-operative Plan: Extubation in OR  Informed Consent: I have reviewed the patients History and Physical, chart, labs and discussed the procedure including the risks, benefits and alternatives for the proposed anesthesia with the patient or authorized representative who has indicated his/her understanding and acceptance.     Dental advisory given  Plan Discussed with: Anesthesiologist and CRNA  Anesthesia Plan Comments:        Anesthesia Quick Evaluation

## 2019-02-02 NOTE — Progress Notes (Signed)
Spoke w/ pt via phone for pre-op interview.  Npo after mn.  Arrive at 0600.  Pt had covid test done 02-01-2019.  Will take synthroid am dos w/ sips of water.

## 2019-02-03 ENCOUNTER — Encounter (HOSPITAL_BASED_OUTPATIENT_CLINIC_OR_DEPARTMENT_OTHER): Payer: Self-pay | Admitting: General Practice

## 2019-02-03 ENCOUNTER — Ambulatory Visit (HOSPITAL_BASED_OUTPATIENT_CLINIC_OR_DEPARTMENT_OTHER): Payer: BLUE CROSS/BLUE SHIELD | Admitting: Anesthesiology

## 2019-02-03 ENCOUNTER — Ambulatory Visit (HOSPITAL_BASED_OUTPATIENT_CLINIC_OR_DEPARTMENT_OTHER)
Admission: RE | Admit: 2019-02-03 | Discharge: 2019-02-03 | Disposition: A | Discharge status: Home / Self Care | Payer: BLUE CROSS/BLUE SHIELD | Attending: Gynecologic Oncology | Admitting: Gynecologic Oncology

## 2019-02-03 ENCOUNTER — Encounter (HOSPITAL_BASED_OUTPATIENT_CLINIC_OR_DEPARTMENT_OTHER)
Admission: RE | Disposition: A | Discharge status: Home / Self Care | Payer: Self-pay | Source: Home / Self Care | Attending: Gynecologic Oncology

## 2019-02-03 DIAGNOSIS — M329 Systemic lupus erythematosus, unspecified: Secondary | ICD-10-CM | POA: Insufficient documentation

## 2019-02-03 DIAGNOSIS — E039 Hypothyroidism, unspecified: Secondary | ICD-10-CM | POA: Insufficient documentation

## 2019-02-03 DIAGNOSIS — G47419 Narcolepsy without cataplexy: Secondary | ICD-10-CM | POA: Diagnosis not present

## 2019-02-03 DIAGNOSIS — Z791 Long term (current) use of non-steroidal anti-inflammatories (NSAID): Secondary | ICD-10-CM | POA: Insufficient documentation

## 2019-02-03 DIAGNOSIS — Z91048 Other nonmedicinal substance allergy status: Secondary | ICD-10-CM | POA: Insufficient documentation

## 2019-02-03 DIAGNOSIS — G43909 Migraine, unspecified, not intractable, without status migrainosus: Secondary | ICD-10-CM | POA: Diagnosis not present

## 2019-02-03 DIAGNOSIS — Z91040 Latex allergy status: Secondary | ICD-10-CM | POA: Insufficient documentation

## 2019-02-03 DIAGNOSIS — Z803 Family history of malignant neoplasm of breast: Secondary | ICD-10-CM | POA: Insufficient documentation

## 2019-02-03 DIAGNOSIS — Z880 Allergy status to penicillin: Secondary | ICD-10-CM | POA: Diagnosis not present

## 2019-02-03 DIAGNOSIS — Z883 Allergy status to other anti-infective agents status: Secondary | ICD-10-CM | POA: Diagnosis not present

## 2019-02-03 DIAGNOSIS — M8580 Other specified disorders of bone density and structure, unspecified site: Secondary | ICD-10-CM | POA: Insufficient documentation

## 2019-02-03 DIAGNOSIS — Z79899 Other long term (current) drug therapy: Secondary | ICD-10-CM | POA: Diagnosis not present

## 2019-02-03 DIAGNOSIS — Z90710 Acquired absence of both cervix and uterus: Secondary | ICD-10-CM | POA: Insufficient documentation

## 2019-02-03 DIAGNOSIS — Z885 Allergy status to narcotic agent status: Secondary | ICD-10-CM | POA: Insufficient documentation

## 2019-02-03 DIAGNOSIS — Z7982 Long term (current) use of aspirin: Secondary | ICD-10-CM | POA: Diagnosis not present

## 2019-02-03 DIAGNOSIS — Z8249 Family history of ischemic heart disease and other diseases of the circulatory system: Secondary | ICD-10-CM | POA: Insufficient documentation

## 2019-02-03 DIAGNOSIS — M1990 Unspecified osteoarthritis, unspecified site: Secondary | ICD-10-CM | POA: Diagnosis not present

## 2019-02-03 DIAGNOSIS — Z87442 Personal history of urinary calculi: Secondary | ICD-10-CM | POA: Insufficient documentation

## 2019-02-03 DIAGNOSIS — Z825 Family history of asthma and other chronic lower respiratory diseases: Secondary | ICD-10-CM | POA: Diagnosis not present

## 2019-02-03 DIAGNOSIS — N900 Mild vulvar dysplasia: Secondary | ICD-10-CM | POA: Insufficient documentation

## 2019-02-03 DIAGNOSIS — Z833 Family history of diabetes mellitus: Secondary | ICD-10-CM | POA: Insufficient documentation

## 2019-02-03 DIAGNOSIS — G4730 Sleep apnea, unspecified: Secondary | ICD-10-CM | POA: Diagnosis not present

## 2019-02-03 DIAGNOSIS — L298 Other pruritus: Secondary | ICD-10-CM | POA: Insufficient documentation

## 2019-02-03 DIAGNOSIS — Z841 Family history of disorders of kidney and ureter: Secondary | ICD-10-CM | POA: Insufficient documentation

## 2019-02-03 DIAGNOSIS — Z888 Allergy status to other drugs, medicaments and biological substances status: Secondary | ICD-10-CM | POA: Diagnosis not present

## 2019-02-03 DIAGNOSIS — I7300 Raynaud's syndrome without gangrene: Secondary | ICD-10-CM | POA: Insufficient documentation

## 2019-02-03 DIAGNOSIS — E559 Vitamin D deficiency, unspecified: Secondary | ICD-10-CM | POA: Diagnosis not present

## 2019-02-03 DIAGNOSIS — Z823 Family history of stroke: Secondary | ICD-10-CM | POA: Insufficient documentation

## 2019-02-03 DIAGNOSIS — G4710 Hypersomnia, unspecified: Secondary | ICD-10-CM | POA: Diagnosis not present

## 2019-02-03 DIAGNOSIS — Z809 Family history of malignant neoplasm, unspecified: Secondary | ICD-10-CM | POA: Insufficient documentation

## 2019-02-03 HISTORY — DX: Presence of spectacles and contact lenses: Z97.3

## 2019-02-03 HISTORY — DX: Other seasonal allergic rhinitis: J30.2

## 2019-02-03 HISTORY — PX: CO2 LASER APPLICATION: SHX5778

## 2019-02-03 HISTORY — DX: Nocturia: R35.1

## 2019-02-03 HISTORY — DX: Unspecified macular degeneration: H35.30

## 2019-02-03 HISTORY — DX: Nausea with vomiting, unspecified: R11.2

## 2019-02-03 HISTORY — DX: Personal history of urinary calculi: Z87.442

## 2019-02-03 HISTORY — DX: Systemic lupus erythematosus, unspecified: M32.9

## 2019-02-03 HISTORY — DX: Hypothyroidism, unspecified: E03.9

## 2019-02-03 HISTORY — DX: Personal history of other medical treatment: Z92.89

## 2019-02-03 HISTORY — DX: Blepharospasm: G24.5

## 2019-02-03 HISTORY — DX: Narcolepsy without cataplexy: G47.419

## 2019-02-03 HISTORY — DX: Other specified postprocedural states: Z98.890

## 2019-02-03 HISTORY — DX: Family history of other specified conditions: Z84.89

## 2019-02-03 HISTORY — DX: Other injury of unspecified body region, initial encounter: T14.8XXA

## 2019-02-03 HISTORY — DX: Dysplasia of vulva, unspecified: N90.3

## 2019-02-03 HISTORY — DX: Personal history of traumatic brain injury: Z87.820

## 2019-02-03 SURGERY — CO2 LASER APPLICATION
Anesthesia: General

## 2019-02-03 MED ORDER — MIDAZOLAM HCL 2 MG/2ML IJ SOLN
INTRAMUSCULAR | Status: AC
  Filled 2019-02-03: qty 2

## 2019-02-03 MED ORDER — LIDOCAINE 2% (20 MG/ML) 5 ML SYRINGE
INTRAMUSCULAR | Status: AC
  Filled 2019-02-03: qty 5

## 2019-02-03 MED ORDER — FENTANYL CITRATE (PF) 100 MCG/2ML IJ SOLN
INTRAMUSCULAR | Status: DC | PRN
  Administered 2019-02-03: 50 ug via INTRAVENOUS

## 2019-02-03 MED ORDER — MIDAZOLAM HCL 2 MG/2ML IJ SOLN
INTRAMUSCULAR | Status: DC | PRN
  Administered 2019-02-03 (×2): 1 mg via INTRAVENOUS

## 2019-02-03 MED ORDER — LACTATED RINGERS IV SOLN
INTRAVENOUS | Status: DC
  Administered 2019-02-03: 1000 mL via INTRAVENOUS
  Administered 2019-02-03: 11:00:00 via INTRAVENOUS
  Filled 2019-02-03: qty 1000

## 2019-02-03 MED ORDER — KETOROLAC TROMETHAMINE 30 MG/ML IJ SOLN
INTRAMUSCULAR | Status: DC | PRN
  Administered 2019-02-03: 30 mg via INTRAVENOUS

## 2019-02-03 MED ORDER — DEXAMETHASONE SODIUM PHOSPHATE 10 MG/ML IJ SOLN
INTRAMUSCULAR | Status: AC
  Filled 2019-02-03: qty 1

## 2019-02-03 MED ORDER — SILVER SULFADIAZINE 1 % EX CREA
TOPICAL_CREAM | CUTANEOUS | Status: DC | PRN
  Administered 2019-02-03: 1 via TOPICAL

## 2019-02-03 MED ORDER — ACETIC ACID 5 % SOLN
Status: DC | PRN
  Administered 2019-02-03 (×2): 1 via TOPICAL

## 2019-02-03 MED ORDER — DEXAMETHASONE SODIUM PHOSPHATE 10 MG/ML IJ SOLN
INTRAMUSCULAR | Status: DC | PRN
  Administered 2019-02-03: 5 mg via INTRAVENOUS

## 2019-02-03 MED ORDER — WHITE PETROLATUM EX OINT
TOPICAL_OINTMENT | CUTANEOUS | Status: AC
  Filled 2019-02-03: qty 5

## 2019-02-03 MED ORDER — PROPOFOL 10 MG/ML IV BOLUS
INTRAVENOUS | Status: AC
  Filled 2019-02-03: qty 40

## 2019-02-03 MED ORDER — FENTANYL CITRATE (PF) 100 MCG/2ML IJ SOLN
INTRAMUSCULAR | Status: AC
  Filled 2019-02-03: qty 2

## 2019-02-03 MED ORDER — IBUPROFEN 800 MG PO TABS: 800.0000 mg | ORAL_TABLET | Freq: Three times a day (TID) | ORAL | 0 refills | Status: DC | PRN

## 2019-02-03 MED ORDER — KETOROLAC TROMETHAMINE 30 MG/ML IJ SOLN
INTRAMUSCULAR | Status: AC
  Filled 2019-02-03: qty 1

## 2019-02-03 MED ORDER — LIDOCAINE 2% (20 MG/ML) 5 ML SYRINGE
INTRAMUSCULAR | Status: DC | PRN
  Administered 2019-02-03: 50 mg via INTRAVENOUS

## 2019-02-03 MED ORDER — PROPOFOL 10 MG/ML IV BOLUS
INTRAVENOUS | Status: DC | PRN
  Administered 2019-02-03: 140 mg via INTRAVENOUS

## 2019-02-03 MED ORDER — ONDANSETRON HCL 4 MG/2ML IJ SOLN
INTRAMUSCULAR | Status: AC
  Filled 2019-02-03: qty 2

## 2019-02-03 MED ORDER — FENTANYL CITRATE (PF) 100 MCG/2ML IJ SOLN
25.0000 ug | INTRAMUSCULAR | Status: DC | PRN
  Administered 2019-02-03: 25 ug via INTRAVENOUS
  Filled 2019-02-03: qty 1

## 2019-02-03 SURGICAL SUPPLY — 34 items
BAG DECANTER FOR FLEXI CONT (MISCELLANEOUS) ×2 IMPLANT
BLADE SURG 11 STRL SS (BLADE) ×1 IMPLANT
CANISTER SUCT 3000ML PPV (MISCELLANEOUS) IMPLANT
CATH SILICONE 16FRX5CC (CATHETERS) ×1 IMPLANT
COVER WAND RF STERILE (DRAPES) ×2 IMPLANT
DEPRESSOR TONGUE BLADE STERILE (MISCELLANEOUS) ×4 IMPLANT
ELECT BALL LEEP 3MM BLK (ELECTRODE) IMPLANT
ELECT BALL LEEP 5MM RED (ELECTRODE) IMPLANT
ELECT NDL TIP 2.8 STRL (NEEDLE) IMPLANT
ELECT NEEDLE TIP 2.8 STRL (NEEDLE) IMPLANT
GLOVE ECLIPSE 7.5 STRL STRAW (GLOVE) ×2 IMPLANT
GLOVE ECLIPSE 8.0 STRL XLNG CF (GLOVE) ×2 IMPLANT
GOWN STRL REUS W/ TWL XL LVL3 (GOWN DISPOSABLE) ×1 IMPLANT
GOWN STRL REUS W/TWL XL LVL3 (GOWN DISPOSABLE) ×2
KIT TURNOVER CYSTO (KITS) ×2 IMPLANT
NDL SAFETY ECLIPSE 18X1.5 (NEEDLE) IMPLANT
NDL SPNL 22GX3.5 QUINCKE BK (NEEDLE) IMPLANT
NEEDLE HYPO 18GX1.5 SHARP (NEEDLE)
NEEDLE SPNL 22GX3.5 QUINCKE BK (NEEDLE) IMPLANT
NS IRRIG 500ML POUR BTL (IV SOLUTION) IMPLANT
PACK VAGINAL WOMENS (CUSTOM PROCEDURE TRAY) IMPLANT
PAD OB MATERNITY 4.3X12.25 (PERSONAL CARE ITEMS) ×2 IMPLANT
PAD PREP 24X48 CUFFED NSTRL (MISCELLANEOUS) ×2 IMPLANT
SCOPETTES 8  STERILE (MISCELLANEOUS)
SCOPETTES 8 STERILE (MISCELLANEOUS) IMPLANT
SUT VIC AB 2-0 SH 27 (SUTURE) ×2
SUT VIC AB 2-0 SH 27XBRD (SUTURE) ×1 IMPLANT
SUT VICRYL 3 0 UR 6 27 (SUTURE) IMPLANT
SYRINGE LUER LOK 1CC (MISCELLANEOUS) ×2 IMPLANT
TOWEL OR 17X26 10 PK STRL BLUE (TOWEL DISPOSABLE) ×4 IMPLANT
TRAY DSU PREP LF (CUSTOM PROCEDURE TRAY) IMPLANT
VACUUM HOSE 7/8X10 W/ WAND (MISCELLANEOUS) IMPLANT
VACUUM HOSE/TUBING 7/8INX6FT (MISCELLANEOUS) IMPLANT
WATER STERILE IRR 500ML POUR (IV SOLUTION) ×2 IMPLANT

## 2019-02-03 NOTE — Discharge Instructions (Signed)
°  NO ADVIL, ALEVE, MOTRIN, IBUPROFEN UNTIL 2 30 PM TODAY     Report to the [facility] for heavy vaginal bleeding.   For questions after hours please call [phone] and ask to speak to the GYN oncology fellow on call.  Pelvic rest for 6 weeks.    Call your surgeon if you experience:   1. Fever over 101. Inability to urinate.  3. Nausea and/or vomiting.  4. Extreme swelling or bruising at the surgical site.  5. Continued bleeding from the incision.  6. Increased pain, redness or drainage from the incision.  7. Problems related to your pain medication.  8. Any problems and/or concerns    Post Anesthesia Home Care Instructions   Activity:  Get plenty of rest for the remainder of the day. A responsible adult should stay with you for 24 hours following the procedure.   For the next 24 hours, DO NOT:  -Drive a car  -Paediatric nurse  -Drink alcoholic beverages  -Take any medication unless instructed by your physician  -Make any legal decisions or sign important papers.   Meals:  Start with liquid foods such as gelatin or soup. Progress to regular foods as tolerated. Avoid greasy, spicy, heavy foods. If nausea and/or vomiting occur, drink only clear liquids until the nausea and/or vomiting subsides. Call your physician if vomiting continues.   Special Instructions/Symptoms:  Your throat may feel dry or sore from the anesthesia or the breathing tube placed in your throat during surgery. If this causes discomfort, gargle with warm salt water. The discomfort should disappear within 24 hours.   If you had a scopolamine patch placed behind your ear for the management of post- operative nausea and/or vomiting:   1. The medication in the patch is effective for 72 hours, after which it should be removed. Wrap patch in a tissue and discard in the trash. Wash hands thoroughly with soap and water.  2. You may remove the patch earlier than 72 hours if you experience unpleasant  side effects which may include dry mouth, dizziness or visual disturbances.  3. Avoid touching the patch. Wash your hands with soap and water after contact with the patch.

## 2019-02-03 NOTE — Anesthesia Procedure Notes (Signed)
Procedure Name: LMA Insertion Date/Time: 02/03/2019 8:16 AM Performed by: Suan Halter, CRNA Pre-anesthesia Checklist: Patient identified, Emergency Drugs available, Suction available and Patient being monitored Patient Re-evaluated:Patient Re-evaluated prior to induction Oxygen Delivery Method: Circle system utilized Preoxygenation: Pre-oxygenation with 100% oxygen Induction Type: IV induction Ventilation: Mask ventilation without difficulty LMA: LMA inserted LMA Size: 4.0 Number of attempts: 1 Airway Equipment and Method: Bite block Placement Confirmation: positive ETCO2 Tube secured with: Tape Dental Injury: Teeth and Oropharynx as per pre-operative assessment

## 2019-02-03 NOTE — Transfer of Care (Signed)
Immediate Anesthesia Transfer of Care Note  Patient: Cindy Fitzgerald  Procedure(s) Performed: Procedure(s) (LRB): CO2 LASER APPLICATION OF VULVA (N/A)  Patient Location: PACU  Anesthesia Type: General  Level of Consciousness: awake, oriented, sedated and patient cooperative  Airway & Oxygen Therapy: Patient Spontanous Breathing and Patient connected to face mask oxygen  Post-op Assessment: Report given to PACU RN and Post -op Vital signs reviewed and stable  Post vital signs: Reviewed and stable  Complications: No apparent anesthesia complications  Last Vitals:  Vitals Value Taken Time  BP 136/72 02/03/19 0851  Temp 36.3 C 02/03/19 0851  Pulse 59 02/03/19 0853  Resp 12 02/03/19 0853  SpO2 100 % 02/03/19 0853  Vitals shown include unvalidated device data.  Last Pain:  Vitals:   02/03/19 0638  PainSc: 0-No pain      Patients Stated Pain Goal: 4 (02/03/19 1856)

## 2019-02-03 NOTE — Interval H&P Note (Signed)
History and Physical Interval Note:  02/03/2019 8:02 AM  Cindy Fitzgerald  has presented today for surgery, with the diagnosis of VULVAR DYSPLASIA.  The various methods of treatment have been discussed with the patient and family. After consideration of risks, benefits and other options for treatment, the patient has consented to  Procedure(s): CO2 LASER APPLICATION OF VULVA (N/A) as a surgical intervention.  The patient's history has been reviewed, patient examined, no change in status, stable for surgery.  I have reviewed the patient's chart and labs.  Questions were answered to the patient's satisfaction.     [name]

## 2019-02-03 NOTE — Anesthesia Postprocedure Evaluation (Signed)
Anesthesia Post Note  Patient: Cindy Fitzgerald  Procedure(s) Performed: CO2 LASER APPLICATION OF VULVA (N/A )     Patient location during evaluation: PACU Anesthesia Type: General Level of consciousness: awake Pain management: pain level controlled Vital Signs Assessment: post-procedure vital signs reviewed and stable Respiratory status: spontaneous breathing Postop Assessment: no apparent nausea or vomiting Anesthetic complications: no    Last Vitals:  Vitals:   02/03/19 0851 02/03/19 0900  BP: 136/72 111/71  Pulse: 61 (!) 50  Resp: 10 12  Temp: (!) 36.3 C   SpO2: 100% 100%    Last Pain:  Vitals:   02/03/19 0900  PainSc: 5                  Maurene Hollin

## 2019-02-03 NOTE — Interval H&P Note (Signed)
History and Physical Interval Note:  02/03/2019 8:02 AM  Cindy Fitzgerald  has presented today for surgery, with the diagnosis of VULVAR DYSPLASIA.  The various methods of treatment have been discussed with the patient and family. After consideration of risks, benefits and other options for treatment, the patient has consented to  Procedure(s): CO2 LASER APPLICATION OF VULVA (N/A) as a surgical intervention.  The patient's history has been reviewed, patient examined, no change in status, stable for surgery.  I have reviewed the patient's chart and labs.  Questions were answered to the patient's satisfaction.     Cindy Fitzgerald   

## 2019-02-03 NOTE — Op Note (Addendum)
OPERATIVE NOTE  DATE: February 03, 2019  Preoperative Diagnosis: VIN I, pruritis  Postoperative Diagnosis: VIN I, pruritis  Procedure(s) Performed: Extensive CO2 laser of the perineum  for 8 minutes  Surgeon: Cindy Found.  Skeet Fitzgerald, M.D. PhD  Anesthesia: LMA  Operative Findings: With application of acetic acid to the external genitalia patchy acetic acid changes were appreciated at the left upper perineum and the introitus bilaterally   Procedure:Cindy Fitzgerald  was taken to the OR and timeout performed.  She was then placed under general anesthesia and her feet placed in the dorsal lithotomy position.  Acetic acid was placed over the external genitalia from the mons to the buttock cheeks and findings were as described above.  She was then prepped and draped in usual sterile fashion.A second time out was performed.  The operative site was bordered with wet towels.  Acetic acid was once again applied.  A margin of 8 mm was obtained surrounding the area with acetowhite changes.  CO2 laser was applied using a spot size of 8 mm, continuous pulse, to a depth of the third surgical plane for 8 min.  Hemostasis was obtained.  The perative field was irrigated,  Silvadene cream was applied to the surgical site.    Specimens: None Estimated Blood Loss: 34ml  Sponge, lap and needle counts were correct x 3.    Complications: None  The patient had sequential compression devices for VTE prophylaxis    Disposition: PACU - hemodynamically stable.         Condition: stable

## 2019-02-04 ENCOUNTER — Encounter (HOSPITAL_BASED_OUTPATIENT_CLINIC_OR_DEPARTMENT_OTHER): Payer: Self-pay | Admitting: Gynecologic Oncology

## 2019-02-07 ENCOUNTER — Other Ambulatory Visit: Payer: Self-pay | Admitting: Gynecologic Oncology

## 2019-02-07 ENCOUNTER — Telehealth: Payer: Self-pay

## 2019-02-07 DIAGNOSIS — G8918 Other acute postprocedural pain: Secondary | ICD-10-CM

## 2019-02-07 MED ORDER — OXYCODONE HCL 5 MG PO TABS: 5.0000 mg | ORAL_TABLET | ORAL | 0 refills | Status: DC | PRN

## 2019-02-07 NOTE — Telephone Encounter (Signed)
Cindy Fitzgerald's pain level is a 5/10 in the peritoneal area after the CO2 laser surgery on 02-03-19. She is using the ice packs, Silvadene cream, and Ibuprofen 800 mg.  She did not know how much longer she will have the burning. Told her that the burning last for a few weeks. Cindy John, NP will send in Oxycodone tabs for the pain.  Pt stated that she has taken this in the past and tolerated it. Told Cindy Fitzgerald to call prior to the weekend if she needs a refill of the Oxycodone. Pt verbalized understanding.

## 2019-02-07 NOTE — Progress Notes (Signed)
See RN note.

## 2019-02-11 ENCOUNTER — Telehealth: Payer: Self-pay | Admitting: *Deleted

## 2019-02-11 ENCOUNTER — Other Ambulatory Visit: Payer: Self-pay | Admitting: Gynecologic Oncology

## 2019-02-11 DIAGNOSIS — G8918 Other acute postprocedural pain: Secondary | ICD-10-CM

## 2019-02-11 MED ORDER — SILVER SULFADIAZINE 1 % EX CREA: 1.0000 "application " | TOPICAL_CREAM | Freq: Two times a day (BID) | CUTANEOUS | 1 refills | Status: DC

## 2019-02-11 MED ORDER — IBUPROFEN 800 MG PO TABS: 800.0000 mg | ORAL_TABLET | Freq: Three times a day (TID) | ORAL | 0 refills | Status: AC | PRN

## 2019-02-11 MED ORDER — OXYCODONE HCL 5 MG PO TABS: 5.0000 mg | ORAL_TABLET | ORAL | 0 refills | Status: DC | PRN

## 2019-02-11 NOTE — Progress Notes (Signed)
See RN note.  Patient requesting refill on pain medication.

## 2019-02-11 NOTE — Telephone Encounter (Signed)
Pt called to report that she is still having pain in the aea where she had her procedure. The area is raw and "still looks the same". Pt reports that she is using ice, silvadene cream, Ibuprofen and oxycodone for the pain..Pt states that she needs a refill on oxycodone and silvadene cream before the weekend. Pt denies fever or chills. Heywood Iles NP notified.

## 2019-02-17 DIAGNOSIS — E038 Other specified hypothyroidism: Secondary | ICD-10-CM | POA: Diagnosis not present

## 2019-02-17 DIAGNOSIS — I7300 Raynaud's syndrome without gangrene: Secondary | ICD-10-CM | POA: Diagnosis not present

## 2019-02-17 DIAGNOSIS — M359 Systemic involvement of connective tissue, unspecified: Secondary | ICD-10-CM | POA: Diagnosis not present

## 2019-02-17 DIAGNOSIS — M150 Primary generalized (osteo)arthritis: Secondary | ICD-10-CM | POA: Diagnosis not present

## 2019-02-17 DIAGNOSIS — M545 Low back pain: Secondary | ICD-10-CM | POA: Diagnosis not present

## 2019-02-21 ENCOUNTER — Other Ambulatory Visit: Payer: Self-pay | Admitting: Gynecologic Oncology

## 2019-02-21 ENCOUNTER — Encounter: Payer: Self-pay | Admitting: Gynecologic Oncology

## 2019-02-21 ENCOUNTER — Telehealth: Payer: Self-pay

## 2019-02-21 DIAGNOSIS — G8918 Other acute postprocedural pain: Secondary | ICD-10-CM

## 2019-02-21 MED ORDER — OXYCODONE HCL 5 MG PO TABS: 5.0000 mg | ORAL_TABLET | ORAL | 0 refills | Status: DC | PRN

## 2019-02-21 NOTE — Telephone Encounter (Signed)
Melissa Cross,NP reviewed pictures and sent a Message to Ms Tristan  Via MyChart

## 2019-02-21 NOTE — Telephone Encounter (Signed)
Cindy Fitzgerald states that she continues with severe burning with urination even with diluting urine. She continues to apply the silvadene cream . Applying ice to the lasered sites. She ran out out the Ibuprofen las Thursday 02-17-19.  Using Tylenol extra strength during the day and 1 Oxycodone tablet to help her sleep. She has 4 tabs left. She will try a 1/2 tab of Oxycodone during the day as she feels foggy headed during the day with a whole 5 mg tablet. There is some yellow drainage on one of the lasered sites. Pain level currently is 5/10. Pt to send pictures of surgical sites for Melissa to review. Joylene John, NP stated that she needs to resume the Ibuprofen 800 mg.  This helps with the inflammation. Pt will use OTC 200 mg tablets.

## 2019-02-21 NOTE — Progress Notes (Signed)
See RN note.  Refill sent in for post-op pain of oxycodone.

## 2019-02-28 ENCOUNTER — Telehealth: Payer: Self-pay | Admitting: *Deleted

## 2019-02-28 NOTE — Telephone Encounter (Signed)
Called and moved the patient's appt from 10/8 to 10/29

## 2019-03-03 ENCOUNTER — Ambulatory Visit: Payer: BLUE CROSS/BLUE SHIELD | Admitting: Gynecologic Oncology
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
## 2019-03-24 ENCOUNTER — Inpatient Hospital Stay: Payer: BLUE CROSS/BLUE SHIELD | Attending: Gynecologic Oncology | Admitting: Gynecologic Oncology

## 2019-03-24 ENCOUNTER — Other Ambulatory Visit: Payer: Self-pay

## 2019-03-24 VITALS — BP 119/64 | HR 64 | Temp 98.5°F | Resp 17 | Ht 65.0 in | Wt 127.0 lb

## 2019-03-24 DIAGNOSIS — H3530 Unspecified macular degeneration: Secondary | ICD-10-CM | POA: Insufficient documentation

## 2019-03-24 DIAGNOSIS — G245 Blepharospasm: Secondary | ICD-10-CM | POA: Insufficient documentation

## 2019-03-24 DIAGNOSIS — R400 Somnolence: Secondary | ICD-10-CM | POA: Diagnosis not present

## 2019-03-24 DIAGNOSIS — Z7982 Long term (current) use of aspirin: Secondary | ICD-10-CM | POA: Diagnosis not present

## 2019-03-24 DIAGNOSIS — Z79899 Other long term (current) drug therapy: Secondary | ICD-10-CM | POA: Diagnosis not present

## 2019-03-24 DIAGNOSIS — L299 Pruritus, unspecified: Secondary | ICD-10-CM | POA: Diagnosis not present

## 2019-03-24 DIAGNOSIS — I7300 Raynaud's syndrome without gangrene: Secondary | ICD-10-CM | POA: Insufficient documentation

## 2019-03-24 DIAGNOSIS — N900 Mild vulvar dysplasia: Secondary | ICD-10-CM | POA: Diagnosis not present

## 2019-03-24 DIAGNOSIS — M329 Systemic lupus erythematosus, unspecified: Secondary | ICD-10-CM | POA: Insufficient documentation

## 2019-03-24 DIAGNOSIS — M8580 Other specified disorders of bone density and structure, unspecified site: Secondary | ICD-10-CM | POA: Insufficient documentation

## 2019-03-24 DIAGNOSIS — E039 Hypothyroidism, unspecified: Secondary | ICD-10-CM | POA: Diagnosis not present

## 2019-03-24 NOTE — Patient Instructions (Signed)
F/U with Dr. Pamala Hurry in 6 months   Thank you very much [name] for allowing me to provide care for you today.  I appreciate your confidence in choosing our Gynecologic Oncology team.  If you have any questions about your visit today please call our office and we will get back to you as soon as possible.  Please consider using the website Medlineplus.gov as an Geneticist, molecular.   Cindy Fitzgerald. Cindy Patil MD., PhD Gynecologic Oncology

## 2019-03-25 ENCOUNTER — Other Ambulatory Visit: Payer: Self-pay

## 2019-03-25 DIAGNOSIS — Z20822 Contact with and (suspected) exposure to covid-19: Secondary | ICD-10-CM

## 2019-03-26 LAB — NOVEL CORONAVIRUS, NAA: SARS-CoV-2, NAA: NOT DETECTED

## 2019-03-30 DIAGNOSIS — G245 Blepharospasm: Secondary | ICD-10-CM | POA: Diagnosis not present

## 2019-04-13 ENCOUNTER — Other Ambulatory Visit: Payer: Self-pay | Admitting: Family Medicine

## 2019-04-13 DIAGNOSIS — Z1231 Encounter for screening mammogram for malignant neoplasm of breast: Secondary | ICD-10-CM

## 2019-04-28 DIAGNOSIS — Z1283 Encounter for screening for malignant neoplasm of skin: Secondary | ICD-10-CM | POA: Diagnosis not present

## 2019-04-28 DIAGNOSIS — L821 Other seborrheic keratosis: Secondary | ICD-10-CM | POA: Diagnosis not present

## 2019-04-28 DIAGNOSIS — D225 Melanocytic nevi of trunk: Secondary | ICD-10-CM | POA: Diagnosis not present

## 2019-05-16 DIAGNOSIS — R519 Headache, unspecified: Secondary | ICD-10-CM | POA: Diagnosis not present

## 2019-05-16 DIAGNOSIS — I10 Essential (primary) hypertension: Secondary | ICD-10-CM | POA: Diagnosis not present

## 2019-06-01 ENCOUNTER — Ambulatory Visit
Admission: RE | Admit: 2019-06-01 | Discharge: 2019-06-01 | Disposition: A | Discharge status: Home / Self Care | Payer: BLUE CROSS/BLUE SHIELD | Source: Ambulatory Visit | Attending: Family Medicine | Admitting: Family Medicine

## 2019-06-01 ENCOUNTER — Other Ambulatory Visit: Payer: Self-pay

## 2019-06-01 DIAGNOSIS — Z1231 Encounter for screening mammogram for malignant neoplasm of breast: Secondary | ICD-10-CM

## 2019-06-01 IMAGING — MG DIGITAL SCREENING BILAT W/ TOMO W/ CAD
8 series · 9 of 24 positions shown · non-contrast
Comparison: Previous exam(s).

CLINICAL DATA: Screening.

EXAM:
DIGITAL SCREENING BILATERAL MAMMOGRAM WITH TOMO AND CAD

[L CC synth-2D]
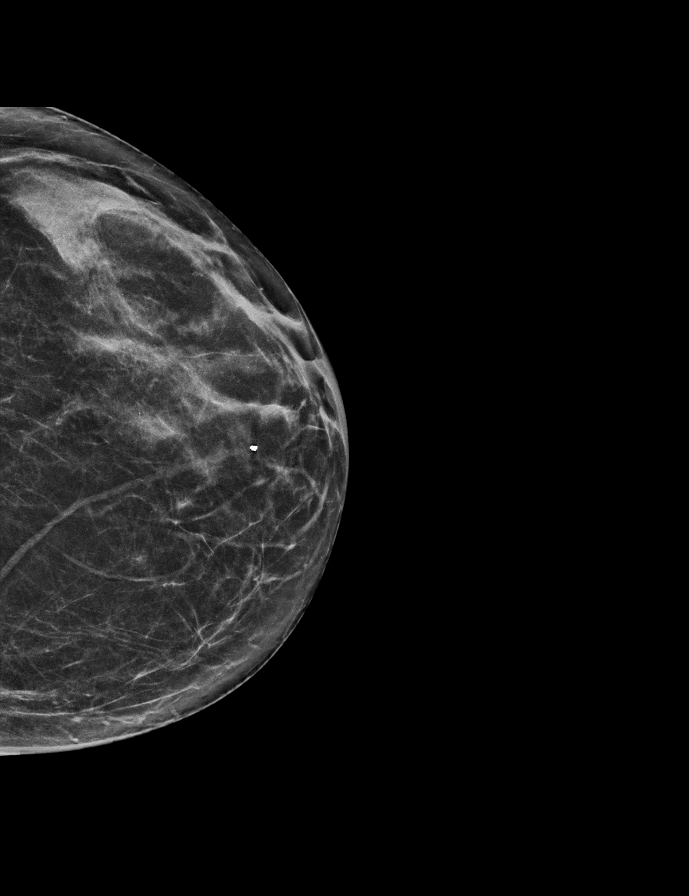

[L MLO synth-2D]
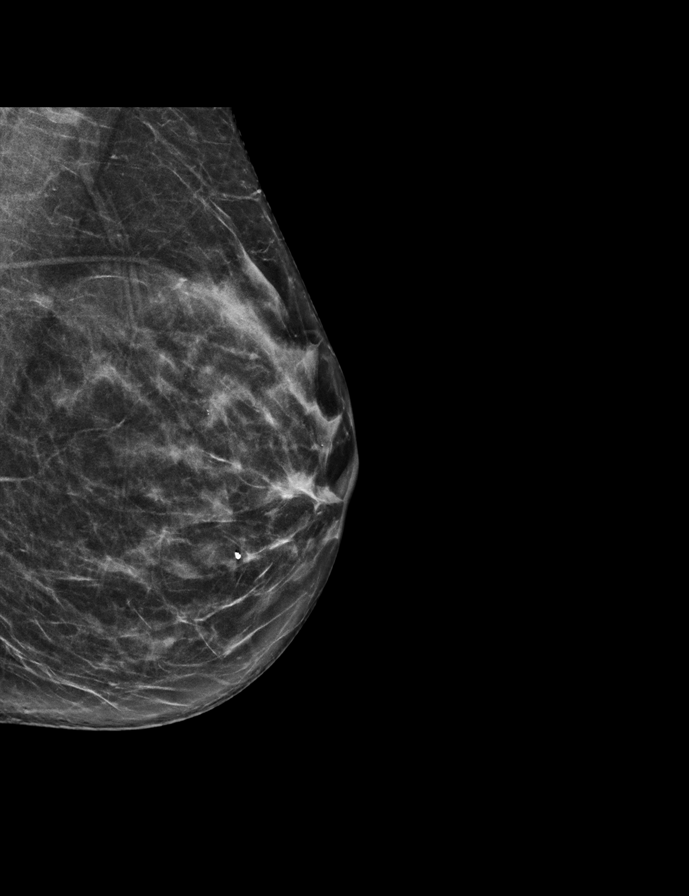

[R MLO synth-2D]
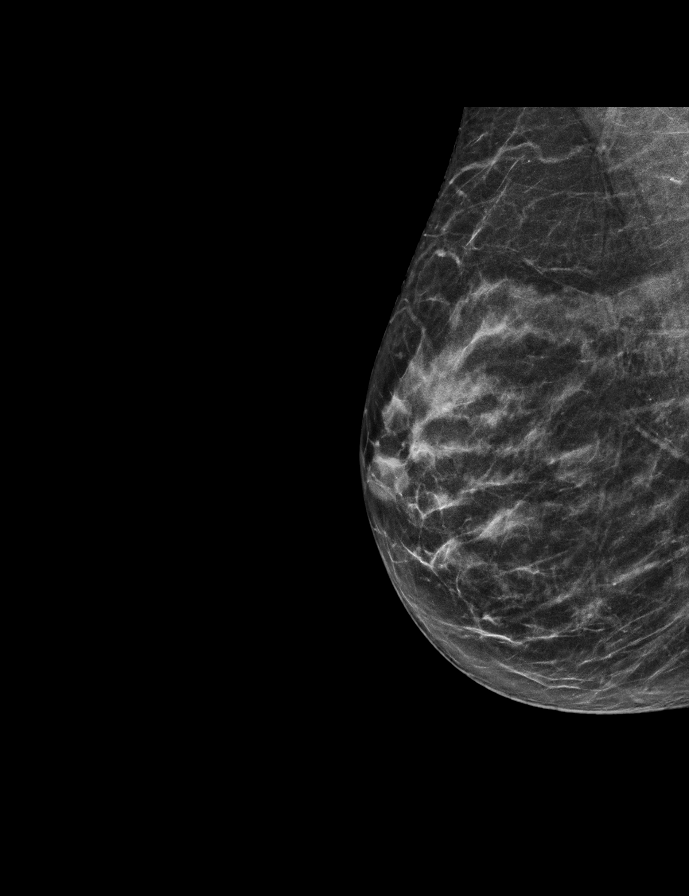

[R CC synth-2D]
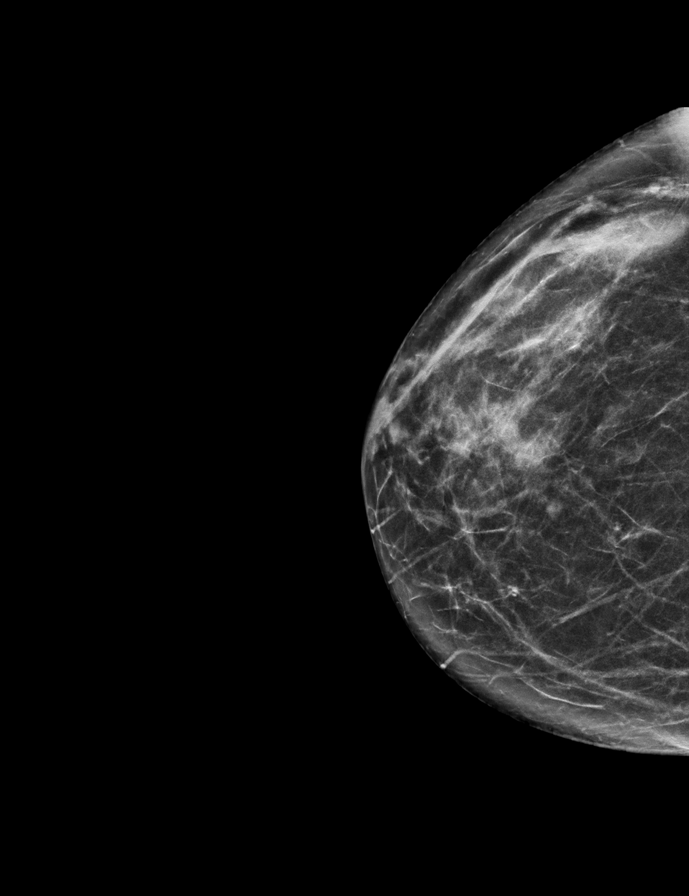

[L CC tomo · 2 of 50 frames shown]
[frame 17/50]
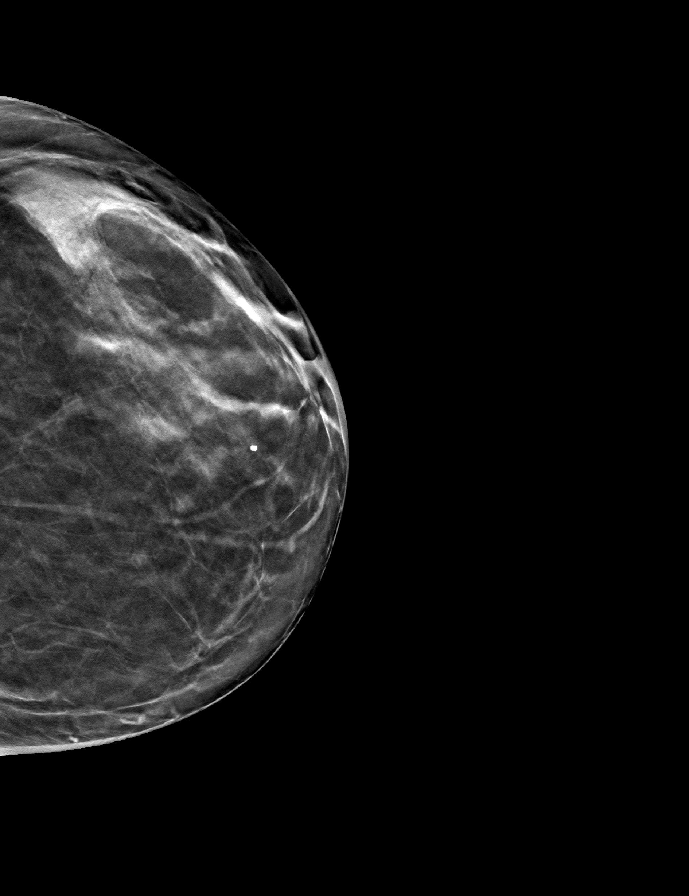
[frame 25/50]
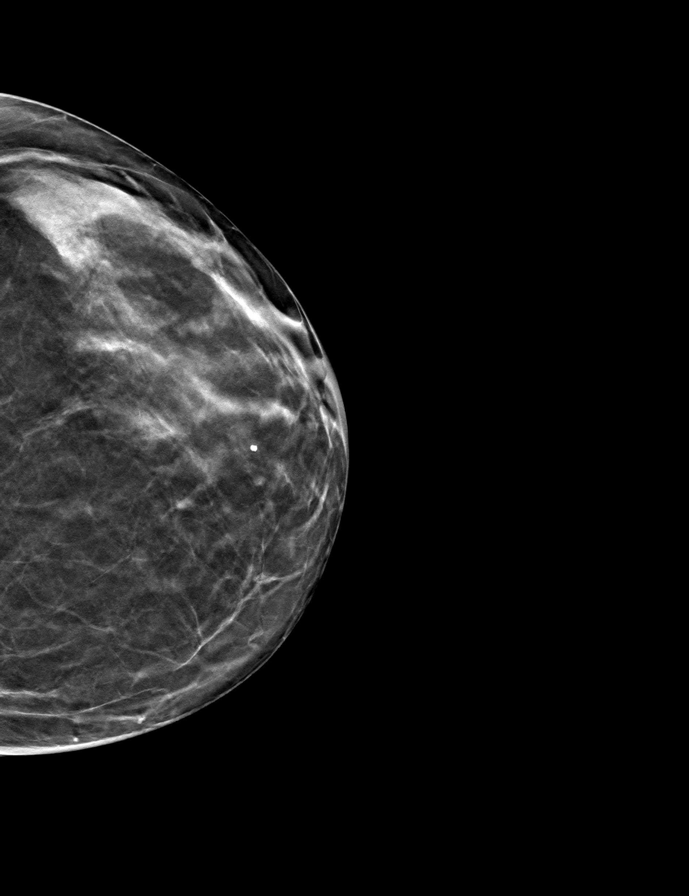

[L MLO tomo · tomo slice 27/53.0]
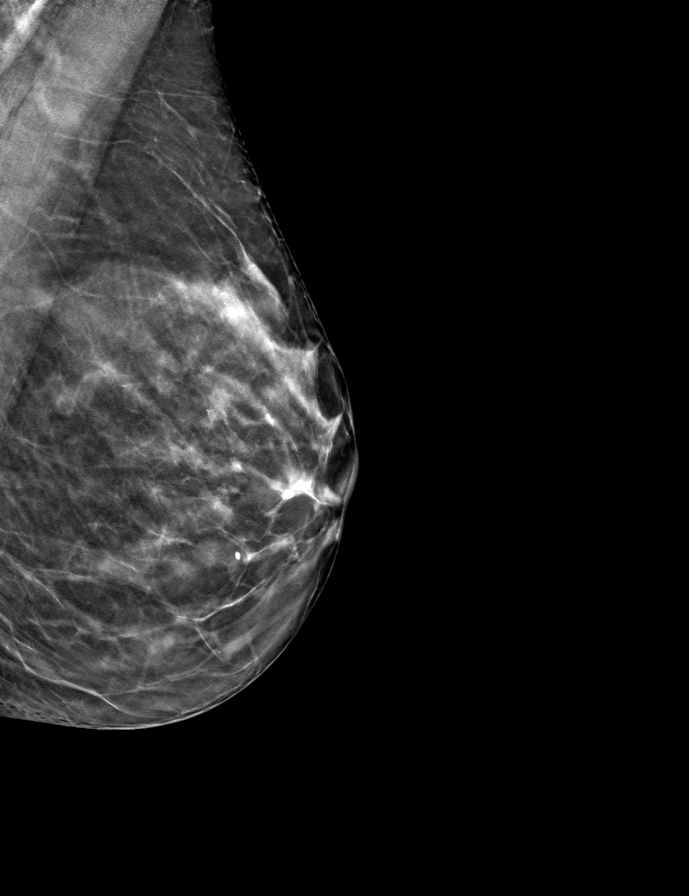

[R CC tomo · tomo slice 29/57.0]
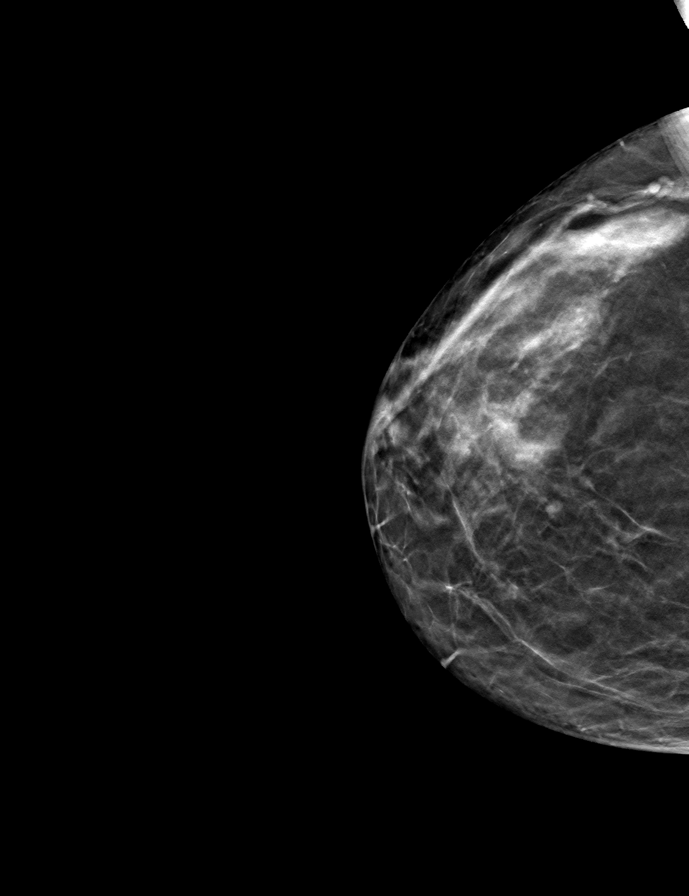

[R MLO tomo · tomo slice 27/52.0]
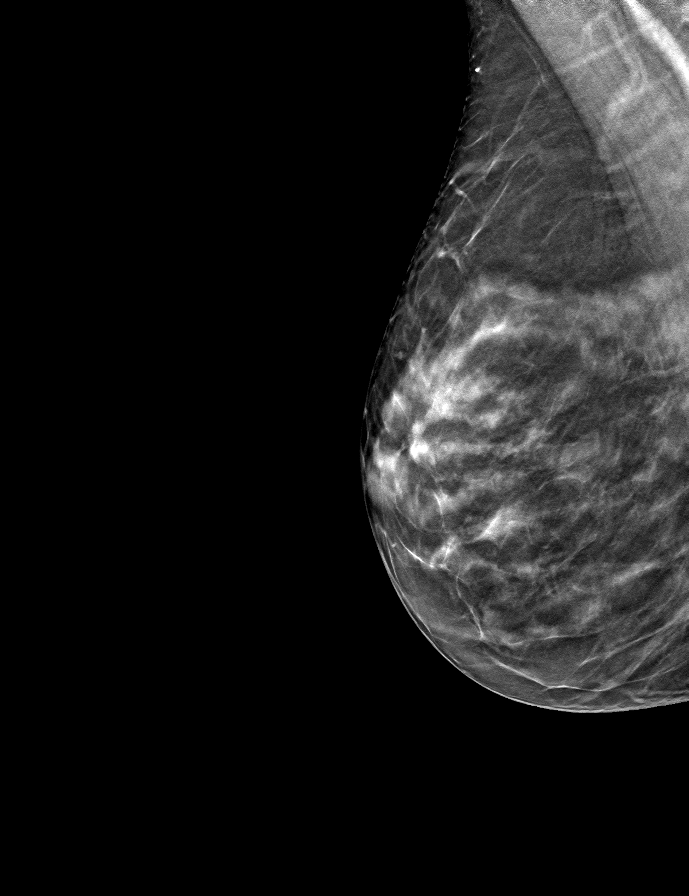

[9 of 24 positions shown; findings below may reference images not displayed]

ACR Breast Density Category c: The breast tissue is heterogeneously
dense, which may obscure small masses.
FINDINGS: In the left breast, a possible mass warrants further evaluation. In
the right breast, no findings suspicious for malignancy.

Images were processed with CAD.
IMPRESSION: Further evaluation is suggested for possible mass in the left
breast.

RECOMMENDATION:
Diagnostic mammogram and possibly ultrasound of the left breast.
(Code:[FW])

The patient will be contacted regarding the findings, and additional
imaging will be scheduled.

BI-RADS CATEGORY  0: Incomplete. Need additional imaging evaluation
and/or prior mammograms for comparison.

## 2019-06-02 ENCOUNTER — Other Ambulatory Visit: Payer: Self-pay | Admitting: Family Medicine

## 2019-06-02 DIAGNOSIS — R928 Other abnormal and inconclusive findings on diagnostic imaging of breast: Secondary | ICD-10-CM

## 2019-06-09 ENCOUNTER — Ambulatory Visit
Admission: RE | Admit: 2019-06-09 | Discharge: 2019-06-09 | Disposition: A | Discharge status: Home / Self Care | Payer: BLUE CROSS/BLUE SHIELD | Source: Ambulatory Visit | Attending: Family Medicine | Admitting: Family Medicine

## 2019-06-09 ENCOUNTER — Ambulatory Visit: Payer: BLUE CROSS/BLUE SHIELD

## 2019-06-09 ENCOUNTER — Other Ambulatory Visit: Payer: Self-pay

## 2019-06-09 DIAGNOSIS — R922 Inconclusive mammogram: Secondary | ICD-10-CM | POA: Diagnosis not present

## 2019-06-09 DIAGNOSIS — R928 Other abnormal and inconclusive findings on diagnostic imaging of breast: Secondary | ICD-10-CM

## 2019-06-09 IMAGING — MG MM DIGITAL DIAGNOSTIC UNILAT*L* W/ TOMO W/ CAD
4 series · 4 of 12 positions shown · non-contrast
Comparison: [DATE] and multiple priors dating back to [DATE]

CLINICAL DATA: 58-year-old patient recalled from recent screening
mammogram for evaluation of a possible mass seen in only in the MLO
projection of the left breast. Her mother and cousin have a history
of breast cancer.

EXAM:
DIGITAL DIAGNOSTIC UNILATERAL LEFT MAMMOGRAM WITH CAD AND TOMO

[L ML synth-2D]
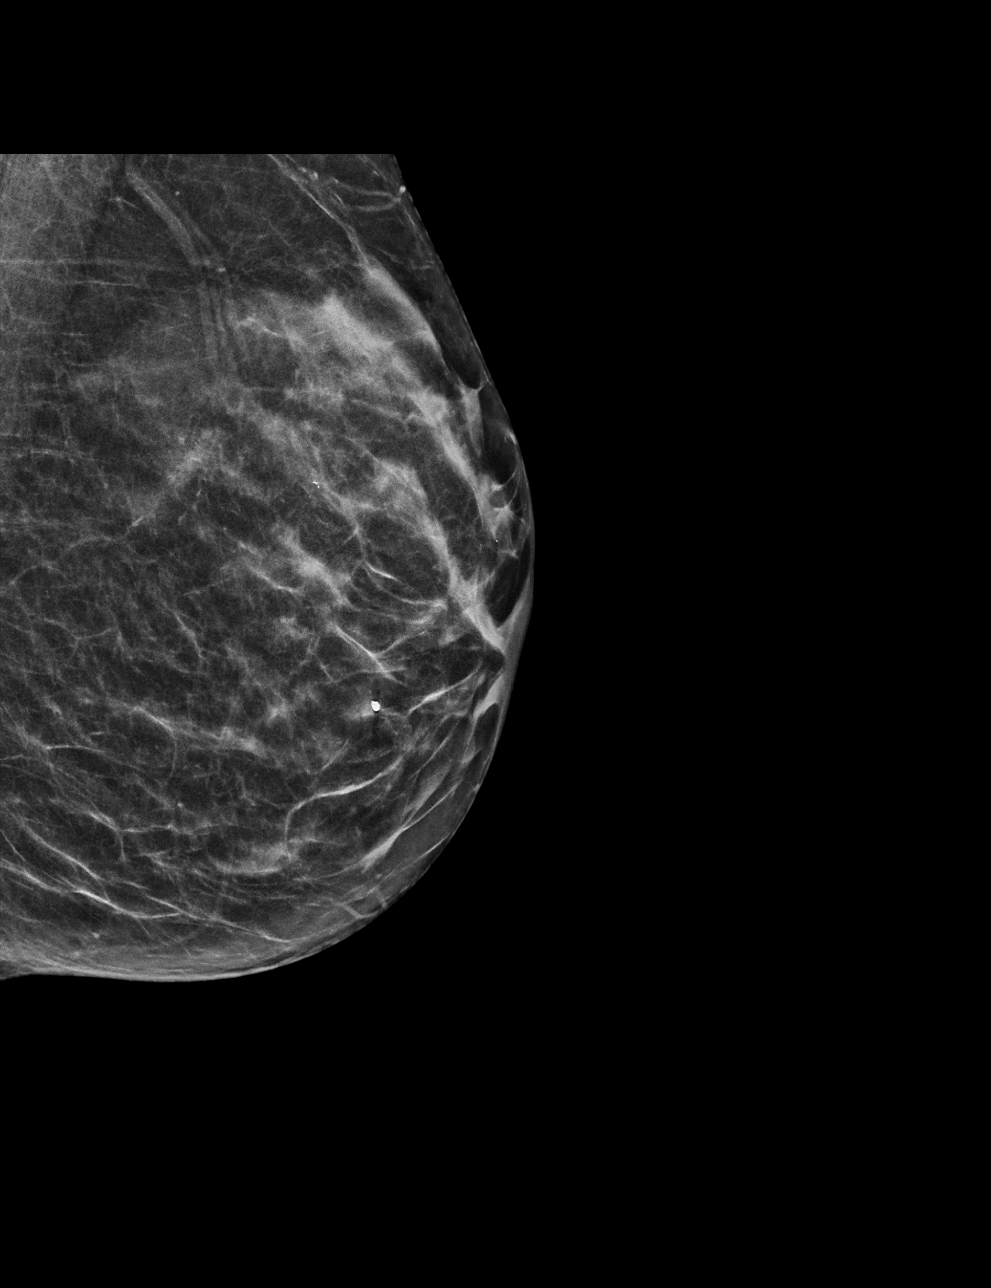

[L MLO synth-2D]
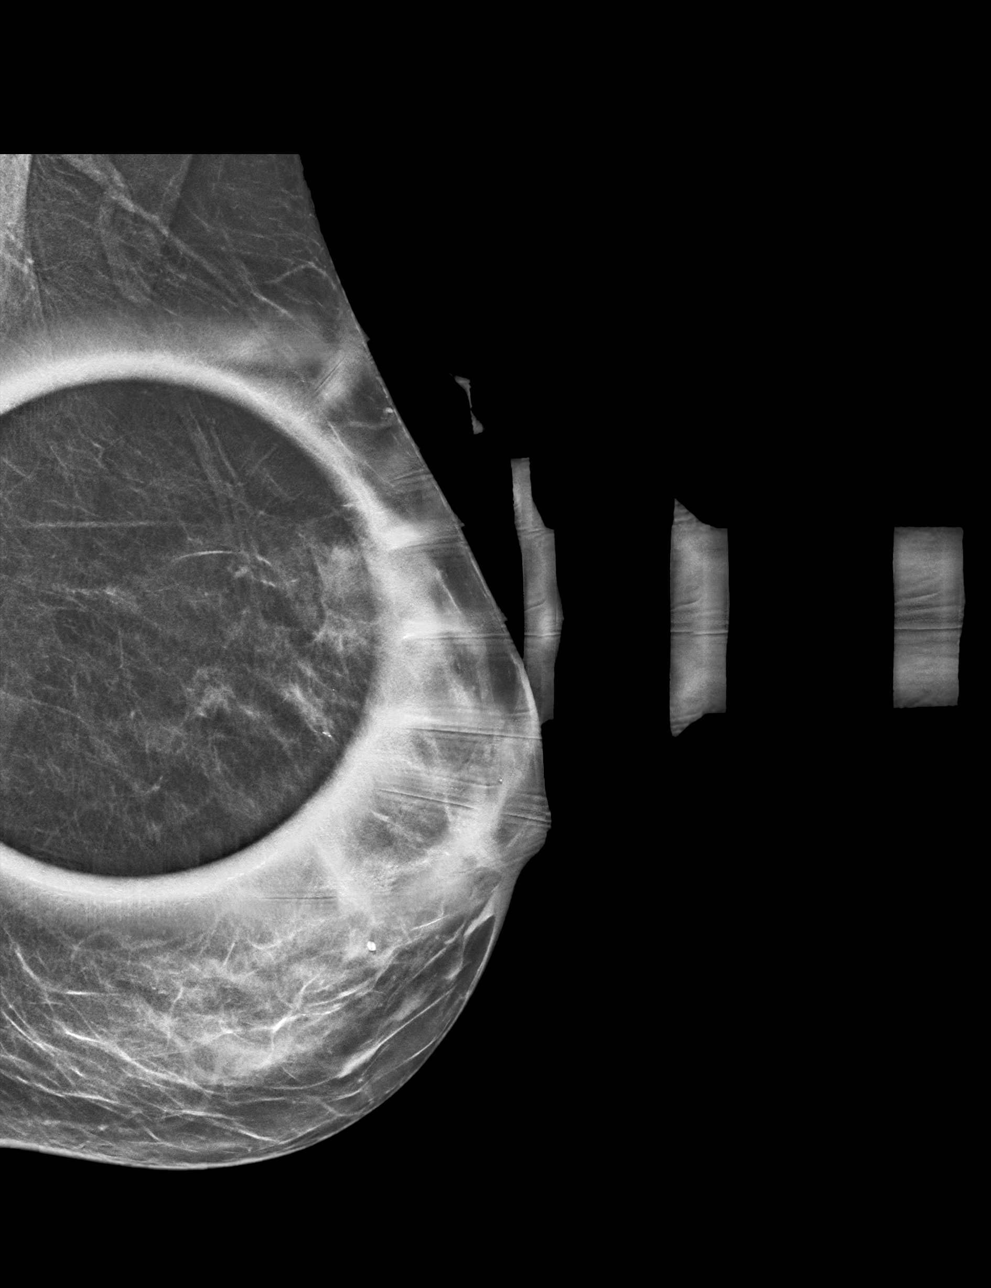

[L ML tomo · tomo slice 25/48.0]
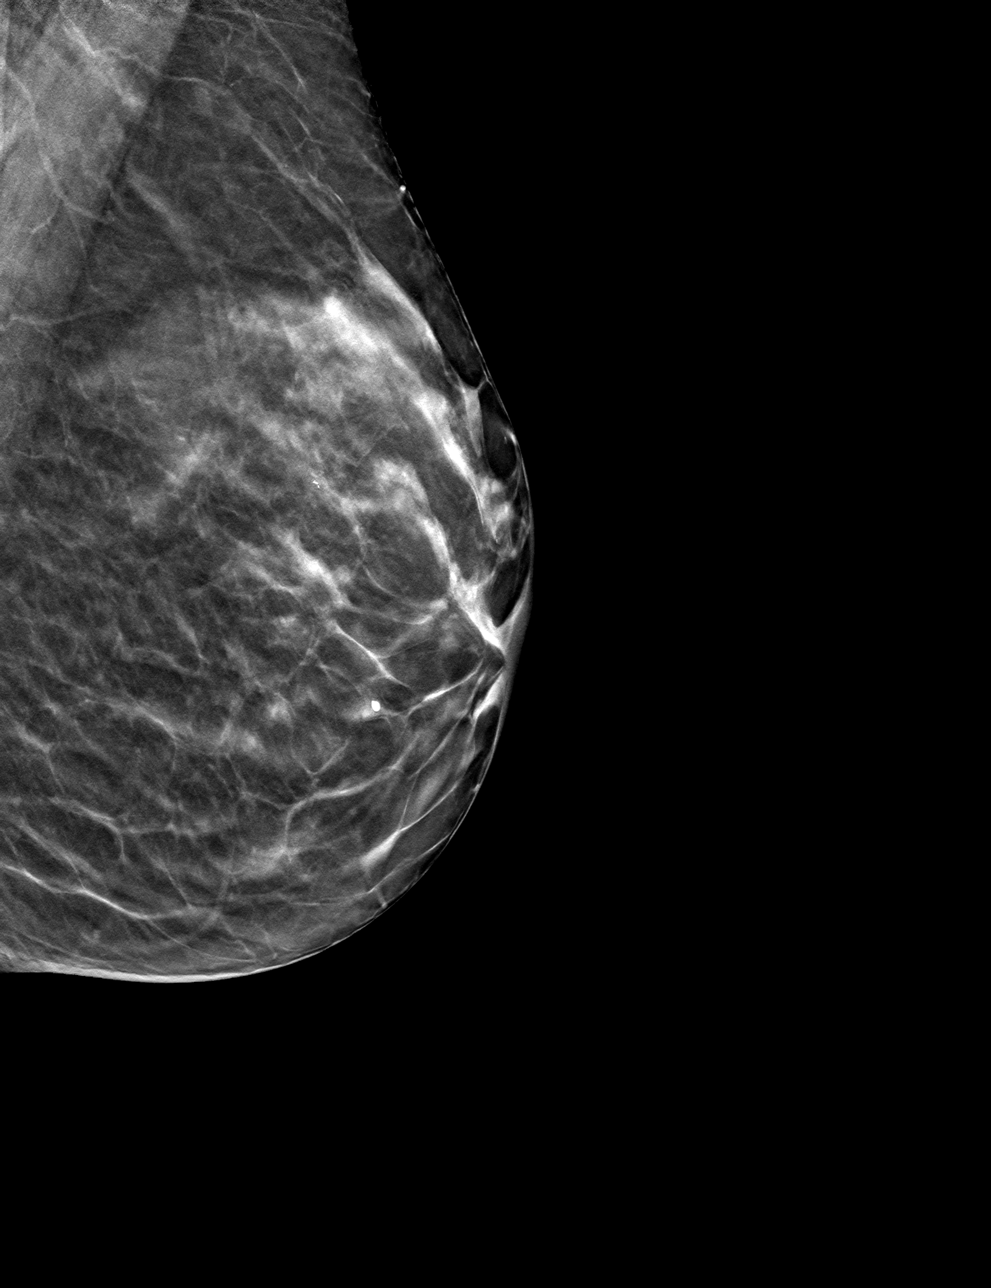

[L MLO tomo · tomo slice 25/48.0]
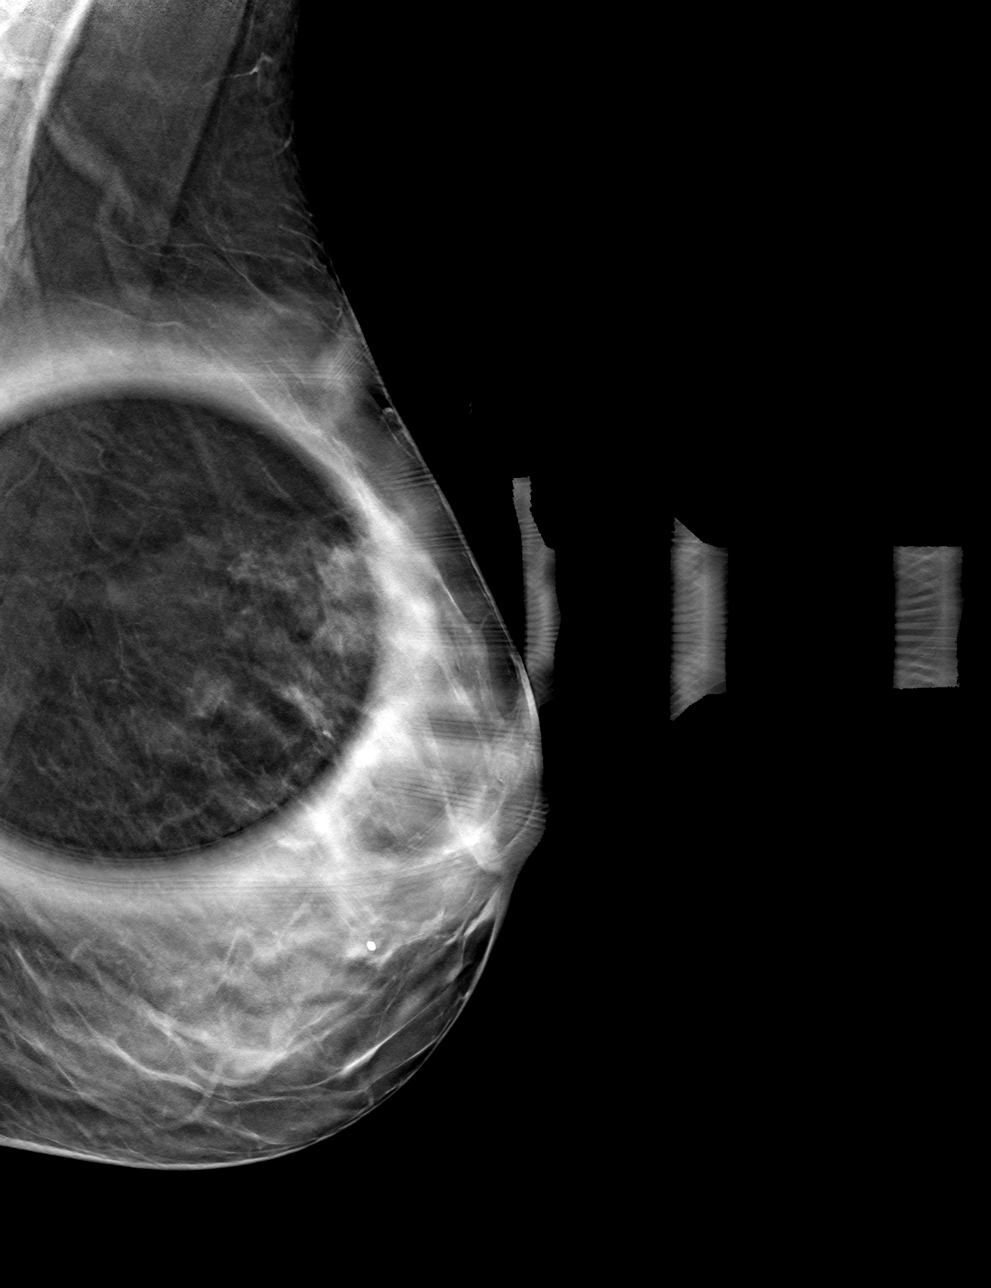

[4 of 12 positions shown; findings below may reference images not displayed]

ACR Breast Density Category c: The breast tissue is heterogeneously
dense, which may obscure small masses.
FINDINGS: Spot compression view of the posterior third of the left breast in
the MLO projection shows normal appearing fibroglandular tissue
without a mass. 90 degree lateral view of the left breast is
negative.

Mammographic images were processed with CAD.
IMPRESSION: No evidence of malignancy in the left breast.

RECOMMENDATION:
Screening mammogram in one year.(Code:[V1])

I have discussed the findings and recommendations with the patient.
If applicable, a reminder letter will be sent to the patient
regarding the next appointment.

BI-RADS CATEGORY  1: Negative.

## 2019-06-25 DIAGNOSIS — R519 Headache, unspecified: Secondary | ICD-10-CM | POA: Diagnosis not present

## 2019-06-25 DIAGNOSIS — Z20822 Contact with and (suspected) exposure to covid-19: Secondary | ICD-10-CM | POA: Diagnosis not present

## 2019-06-25 DIAGNOSIS — R0981 Nasal congestion: Secondary | ICD-10-CM | POA: Diagnosis not present

## 2019-06-25 DIAGNOSIS — J029 Acute pharyngitis, unspecified: Secondary | ICD-10-CM | POA: Diagnosis not present

## 2019-06-27 DIAGNOSIS — U071 COVID-19: Secondary | ICD-10-CM | POA: Diagnosis not present

## 2019-06-28 ENCOUNTER — Other Ambulatory Visit: Payer: Self-pay | Admitting: Nurse Practitioner

## 2019-06-28 ENCOUNTER — Telehealth: Payer: Self-pay | Admitting: Nurse Practitioner

## 2019-06-28 DIAGNOSIS — M329 Systemic lupus erythematosus, unspecified: Secondary | ICD-10-CM

## 2019-06-28 DIAGNOSIS — U071 COVID-19: Secondary | ICD-10-CM

## 2019-06-28 NOTE — Telephone Encounter (Signed)
Called to discuss with Cindy Fitzgerald about Covid symptoms and the use of bamlanivimab, a monoclonal antibody infusion for those with mild to moderate Covid symptoms and at a high risk of hospitalization.     Pt is qualified for this infusion at the [facility] infusion center due to co-morbid conditions and/or a member of an at-risk group (immunocompromised with history of SLE on Plaquenil/CAD).  Symptoms tier reviewed as well as criteria for ending isolation.  Symptoms reviewed that would warrant ED/Hospital evaluation. Preventative practices reviewed. Patient verbalized understanding. Patient advised to go to Urgent care or ED with severe symptoms.   Cindy Fitzgerald verbalized wishes to receive infusion. She has been scheduled for 06/29/19 @ 0830 as requested. She verbalized understanding of treatment and appointment details.    MyChart message also sent as requested.   Patient Active Problem List   Diagnosis Date Noted  . Chest pain 08/05/2013  . Hypersomnolence 07/01/2011    Cindy Fitzgerald, AGPCNP-BC Pager: [phone] Amion: Thea Alken

## 2019-06-28 NOTE — Progress Notes (Signed)
  I connected by phone with Cindy Fitzgerald on 06/28/2019 at 10:13 AM to discuss the potential use of an new treatment for mild to moderate COVID-19 viral infection in non-hospitalized patients.  This patient is a 59 y.o. female that meets the FDA criteria for Emergency Use Authorization of bamlanivimab or casirivimab\imdevimab.  Has a (+) direct SARS-CoV-2 viral test result  Has mild or moderate COVID-19   Is ? 57 years of age and weighs ? 40 kg  Is NOT hospitalized due to COVID-19  Is NOT requiring oxygen therapy or requiring an increase in baseline oxygen flow rate due to COVID-19  Is within 10 days of symptom onset  Has at least one of the high risk factor(s) for progression to severe COVID-19 and/or hospitalization as defined in EUA.  Specific high risk criteria : Currently receiving immunosuppressive treatment (SLE receiving Plaquenil)    I have spoken and communicated the following to the patient or parent/caregiver:  1. FDA has authorized the emergency use of bamlanivimab and casirivimab\imdevimab for the treatment of mild to moderate COVID-19 in adults and pediatric patients with positive results of direct SARS-CoV-2 viral testing who are 61 years of age and older weighing at least 40 kg, and who are at high risk for progressing to severe COVID-19 and/or hospitalization.  2. The significant known and potential risks and benefits of bamlanivimab and casirivimab\imdevimab, and the extent to which such potential risks and benefits are unknown.  3. Information on available alternative treatments and the risks and benefits of those alternatives, including clinical trials.  4. Patients treated with bamlanivimab and casirivimab\imdevimab should continue to self-isolate and use infection control measures (e.g., wear mask, isolate, social distance, avoid sharing personal items, clean and disinfect "high touch" surfaces, and frequent handwashing) according to CDC guidelines.   5. The  patient or parent/caregiver has the option to accept or refuse bamlanivimab or casirivimab\imdevimab .  After reviewing this information with the patient, The patient agreed to proceed with receiving the bamlanimivab infusion and will be provided a copy of the Fact sheet prior to receiving the infusion.   Nikki Pickenpack-Cousar 06/28/2019 10:13 AM

## 2019-06-29 ENCOUNTER — Ambulatory Visit (HOSPITAL_COMMUNITY)
Admission: RE | Admit: 2019-06-29 | Discharge: 2019-06-29 | Disposition: A | Discharge status: Home / Self Care | Payer: BLUE CROSS/BLUE SHIELD | Source: Ambulatory Visit | Attending: Pulmonary Disease | Admitting: Pulmonary Disease

## 2019-06-29 DIAGNOSIS — M329 Systemic lupus erythematosus, unspecified: Secondary | ICD-10-CM | POA: Diagnosis not present

## 2019-06-29 DIAGNOSIS — U071 COVID-19: Secondary | ICD-10-CM | POA: Diagnosis not present

## 2019-06-29 MED ORDER — SODIUM CHLORIDE 0.9 % IV SOLN
700.0000 mg | Freq: Once | INTRAVENOUS | Status: AC
  Administered 2019-06-29: 700 mg via INTRAVENOUS
  Filled 2019-06-29: qty 20

## 2019-06-29 MED ORDER — ALBUTEROL SULFATE HFA 108 (90 BASE) MCG/ACT IN AERS: 2.0000 | INHALATION_SPRAY | Freq: Once | RESPIRATORY_TRACT | Status: DC | PRN

## 2019-06-29 MED ORDER — EPINEPHRINE 0.3 MG/0.3ML IJ SOAJ: 0.3000 mg | Freq: Once | INTRAMUSCULAR | Status: DC | PRN

## 2019-06-29 MED ORDER — FAMOTIDINE IN NACL 20-0.9 MG/50ML-% IV SOLN: 20.0000 mg | Freq: Once | INTRAVENOUS | Status: DC | PRN

## 2019-06-29 MED ORDER — SODIUM CHLORIDE 0.9 % IV SOLN: INTRAVENOUS | Status: DC | PRN

## 2019-06-29 MED ORDER — METHYLPREDNISOLONE SODIUM SUCC 125 MG IJ SOLR: 125.0000 mg | Freq: Once | INTRAMUSCULAR | Status: DC | PRN

## 2019-06-29 MED ORDER — DIPHENHYDRAMINE HCL 50 MG/ML IJ SOLN: 50.0000 mg | Freq: Once | INTRAMUSCULAR | Status: DC | PRN

## 2019-06-29 NOTE — Discharge Instructions (Signed)

## 2019-06-29 NOTE — Progress Notes (Signed)
  Diagnosis: COVID-19  Physician: Wright  Procedure: Covid Infusion Clinic Med: bamlanivimab infusion - Provided patient with bamlanimivab fact sheet for patients, parents and caregivers prior to infusion.  Complications: No immediate complications noted.  Discharge: Discharged home   Cindy Fitzgerald E 06/29/2019  

## 2019-07-13 DIAGNOSIS — R109 Unspecified abdominal pain: Secondary | ICD-10-CM | POA: Diagnosis not present

## 2019-07-13 DIAGNOSIS — N9412 Deep dyspareunia: Secondary | ICD-10-CM | POA: Diagnosis not present

## 2019-07-13 DIAGNOSIS — R300 Dysuria: Secondary | ICD-10-CM | POA: Diagnosis not present

## 2019-07-13 DIAGNOSIS — N900 Mild vulvar dysplasia: Secondary | ICD-10-CM | POA: Diagnosis not present

## 2019-07-28 DIAGNOSIS — G47419 Narcolepsy without cataplexy: Secondary | ICD-10-CM | POA: Diagnosis not present

## 2019-07-28 DIAGNOSIS — G43009 Migraine without aura, not intractable, without status migrainosus: Secondary | ICD-10-CM | POA: Diagnosis not present

## 2019-08-18 DIAGNOSIS — I7300 Raynaud's syndrome without gangrene: Secondary | ICD-10-CM | POA: Diagnosis not present

## 2019-08-18 DIAGNOSIS — M150 Primary generalized (osteo)arthritis: Secondary | ICD-10-CM | POA: Diagnosis not present

## 2019-08-18 DIAGNOSIS — M359 Systemic involvement of connective tissue, unspecified: Secondary | ICD-10-CM | POA: Diagnosis not present

## 2019-10-04 DIAGNOSIS — Z01419 Encounter for gynecological examination (general) (routine) without abnormal findings: Secondary | ICD-10-CM | POA: Diagnosis not present

## 2019-10-04 DIAGNOSIS — N900 Mild vulvar dysplasia: Secondary | ICD-10-CM | POA: Diagnosis not present

## 2019-10-04 DIAGNOSIS — Z6820 Body mass index (BMI) 20.0-20.9, adult: Secondary | ICD-10-CM | POA: Diagnosis not present

## 2019-10-04 DIAGNOSIS — R32 Unspecified urinary incontinence: Secondary | ICD-10-CM | POA: Diagnosis not present

## 2019-10-04 DIAGNOSIS — Z79890 Hormone replacement therapy (postmenopausal): Secondary | ICD-10-CM | POA: Diagnosis not present

## 2019-10-05 DIAGNOSIS — E7800 Pure hypercholesterolemia, unspecified: Secondary | ICD-10-CM | POA: Diagnosis not present

## 2019-10-05 DIAGNOSIS — E559 Vitamin D deficiency, unspecified: Secondary | ICD-10-CM | POA: Diagnosis not present

## 2019-10-05 DIAGNOSIS — M859 Disorder of bone density and structure, unspecified: Secondary | ICD-10-CM | POA: Diagnosis not present

## 2019-10-05 DIAGNOSIS — Z79899 Other long term (current) drug therapy: Secondary | ICD-10-CM | POA: Diagnosis not present

## 2019-10-05 DIAGNOSIS — Z0000 Encounter for general adult medical examination without abnormal findings: Secondary | ICD-10-CM | POA: Diagnosis not present

## 2019-10-31 DIAGNOSIS — Z79899 Other long term (current) drug therapy: Secondary | ICD-10-CM | POA: Diagnosis not present

## 2019-12-05 DIAGNOSIS — J988 Other specified respiratory disorders: Secondary | ICD-10-CM | POA: Diagnosis not present

## 2019-12-05 DIAGNOSIS — R0989 Other specified symptoms and signs involving the circulatory and respiratory systems: Secondary | ICD-10-CM | POA: Diagnosis not present

## 2019-12-05 DIAGNOSIS — R05 Cough: Secondary | ICD-10-CM | POA: Diagnosis not present

## 2019-12-06 DIAGNOSIS — Z03818 Encounter for observation for suspected exposure to other biological agents ruled out: Secondary | ICD-10-CM | POA: Diagnosis not present

## 2019-12-06 DIAGNOSIS — R509 Fever, unspecified: Secondary | ICD-10-CM | POA: Diagnosis not present

## 2019-12-12 ENCOUNTER — Other Ambulatory Visit: Payer: Self-pay

## 2019-12-12 ENCOUNTER — Other Ambulatory Visit: Payer: Self-pay | Admitting: Family Medicine

## 2019-12-12 ENCOUNTER — Ambulatory Visit
Admission: RE | Admit: 2019-12-12 | Discharge: 2019-12-12 | Disposition: A | Discharge status: Home / Self Care | Payer: BLUE CROSS/BLUE SHIELD | Source: Ambulatory Visit | Attending: Family Medicine | Admitting: Family Medicine

## 2019-12-12 ENCOUNTER — Encounter (HOSPITAL_BASED_OUTPATIENT_CLINIC_OR_DEPARTMENT_OTHER): Payer: Self-pay | Admitting: *Deleted

## 2019-12-12 ENCOUNTER — Emergency Department (HOSPITAL_BASED_OUTPATIENT_CLINIC_OR_DEPARTMENT_OTHER)
Admission: EM | Admit: 2019-12-12 | Discharge: 2019-12-12 | Disposition: A | Discharge status: Home / Self Care | Payer: BLUE CROSS/BLUE SHIELD | Attending: Emergency Medicine | Admitting: Emergency Medicine

## 2019-12-12 DIAGNOSIS — R0602 Shortness of breath: Secondary | ICD-10-CM

## 2019-12-12 DIAGNOSIS — Z91040 Latex allergy status: Secondary | ICD-10-CM | POA: Diagnosis not present

## 2019-12-12 DIAGNOSIS — J069 Acute upper respiratory infection, unspecified: Secondary | ICD-10-CM | POA: Diagnosis not present

## 2019-12-12 DIAGNOSIS — E039 Hypothyroidism, unspecified: Secondary | ICD-10-CM | POA: Insufficient documentation

## 2019-12-12 DIAGNOSIS — Z7982 Long term (current) use of aspirin: Secondary | ICD-10-CM | POA: Insufficient documentation

## 2019-12-12 LAB — COMPREHENSIVE METABOLIC PANEL
ALT: 21 U/L (ref 0–44)
AST: 21 U/L (ref 15–41)
Albumin: 3.7 g/dL (ref 3.5–5.0)
Alkaline Phosphatase: 83 U/L (ref 38–126)
Anion gap: 9 (ref 5–15)
BUN: 15 mg/dL (ref 6–20)
CO2: 25 mmol/L (ref 22–32)
Calcium: 8.6 mg/dL — ABNORMAL LOW (ref 8.9–10.3)
Chloride: 106 mmol/L (ref 98–111)
Creatinine, Ser: 0.82 mg/dL (ref 0.44–1.00)
GFR calc Af Amer: >60 mL/min (ref >60)
GFR calc non Af Amer: >60 mL/min (ref >60)
Glucose, Bld: 83 mg/dL (ref 70–99)
Potassium: 3.4 mmol/L — ABNORMAL LOW (ref 3.5–5.1)
Sodium: 140 mmol/L (ref 135–145)
Total Bilirubin: 0.3 mg/dL (ref 0.3–1.2)
Total Protein: 6.7 g/dL (ref 6.5–8.1)

## 2019-12-12 LAB — CBC WITH DIFFERENTIAL/PLATELET
Abs Immature Granulocytes: 0.02 10*3/uL (ref 0.00–0.07)
Basophils Absolute: 0 10*3/uL (ref 0.0–0.1)
Basophils Relative: 1 %
Eosinophils Absolute: 0 10*3/uL (ref 0.0–0.5)
Eosinophils Relative: 1 %
HCT: 42.1 % (ref 36.0–46.0)
Hemoglobin: 13.7 g/dL (ref 12.0–15.0)
Immature Granulocytes: 0 %
Lymphocytes Relative: 47 %
Lymphs Abs: 2.3 10*3/uL (ref 0.7–4.0)
MCH: 31.1 pg (ref 26.0–34.0)
MCHC: 32.5 g/dL (ref 30.0–36.0)
MCV: 95.7 fL (ref 80.0–100.0)
Monocytes Absolute: 0.3 10*3/uL (ref 0.1–1.0)
Monocytes Relative: 7 %
Neutro Abs: 2.2 10*3/uL (ref 1.7–7.7)
Neutrophils Relative %: 44 %
Platelets: 215 10*3/uL (ref 150–400)
RBC: 4.4 MIL/uL (ref 3.87–5.11)
RDW: 12.3 % (ref 11.5–15.5)
WBC: 4.9 10*3/uL (ref 4.0–10.5)
nRBC: 0 % (ref 0.0–0.2)

## 2019-12-12 LAB — D-DIMER, QUANTITATIVE: D-Dimer, Quant: 0.31 ug/mL-FEU (ref 0.00–0.50)

## 2019-12-12 IMAGING — CR DG CHEST 2V
2 series · 2 of 2 positions shown · non-contrast
Comparison: [DATE]

CLINICAL DATA: Cough, fever, shortness of breath

EXAM:
CHEST - 2 VIEW

[w chest pa]
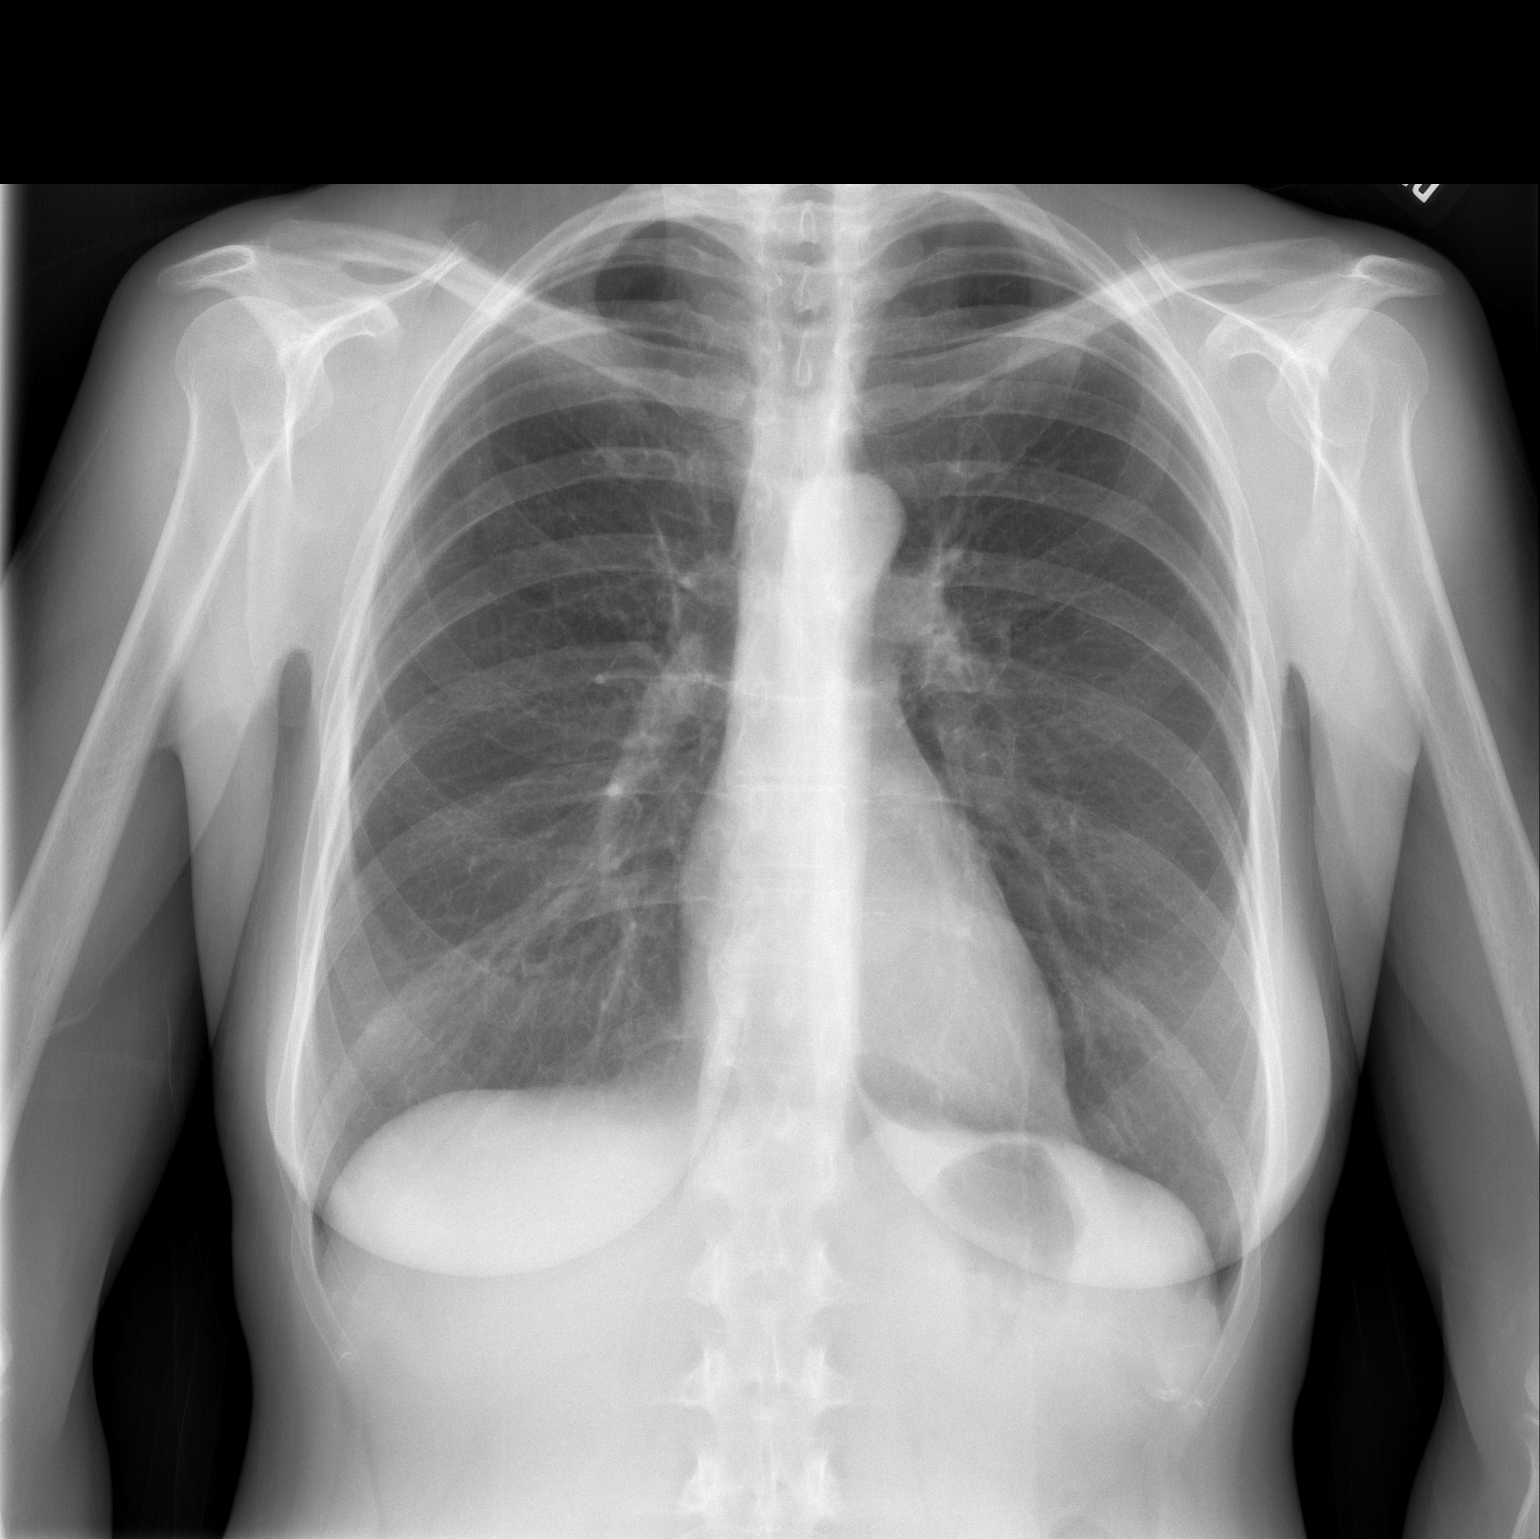

[w chest lat]
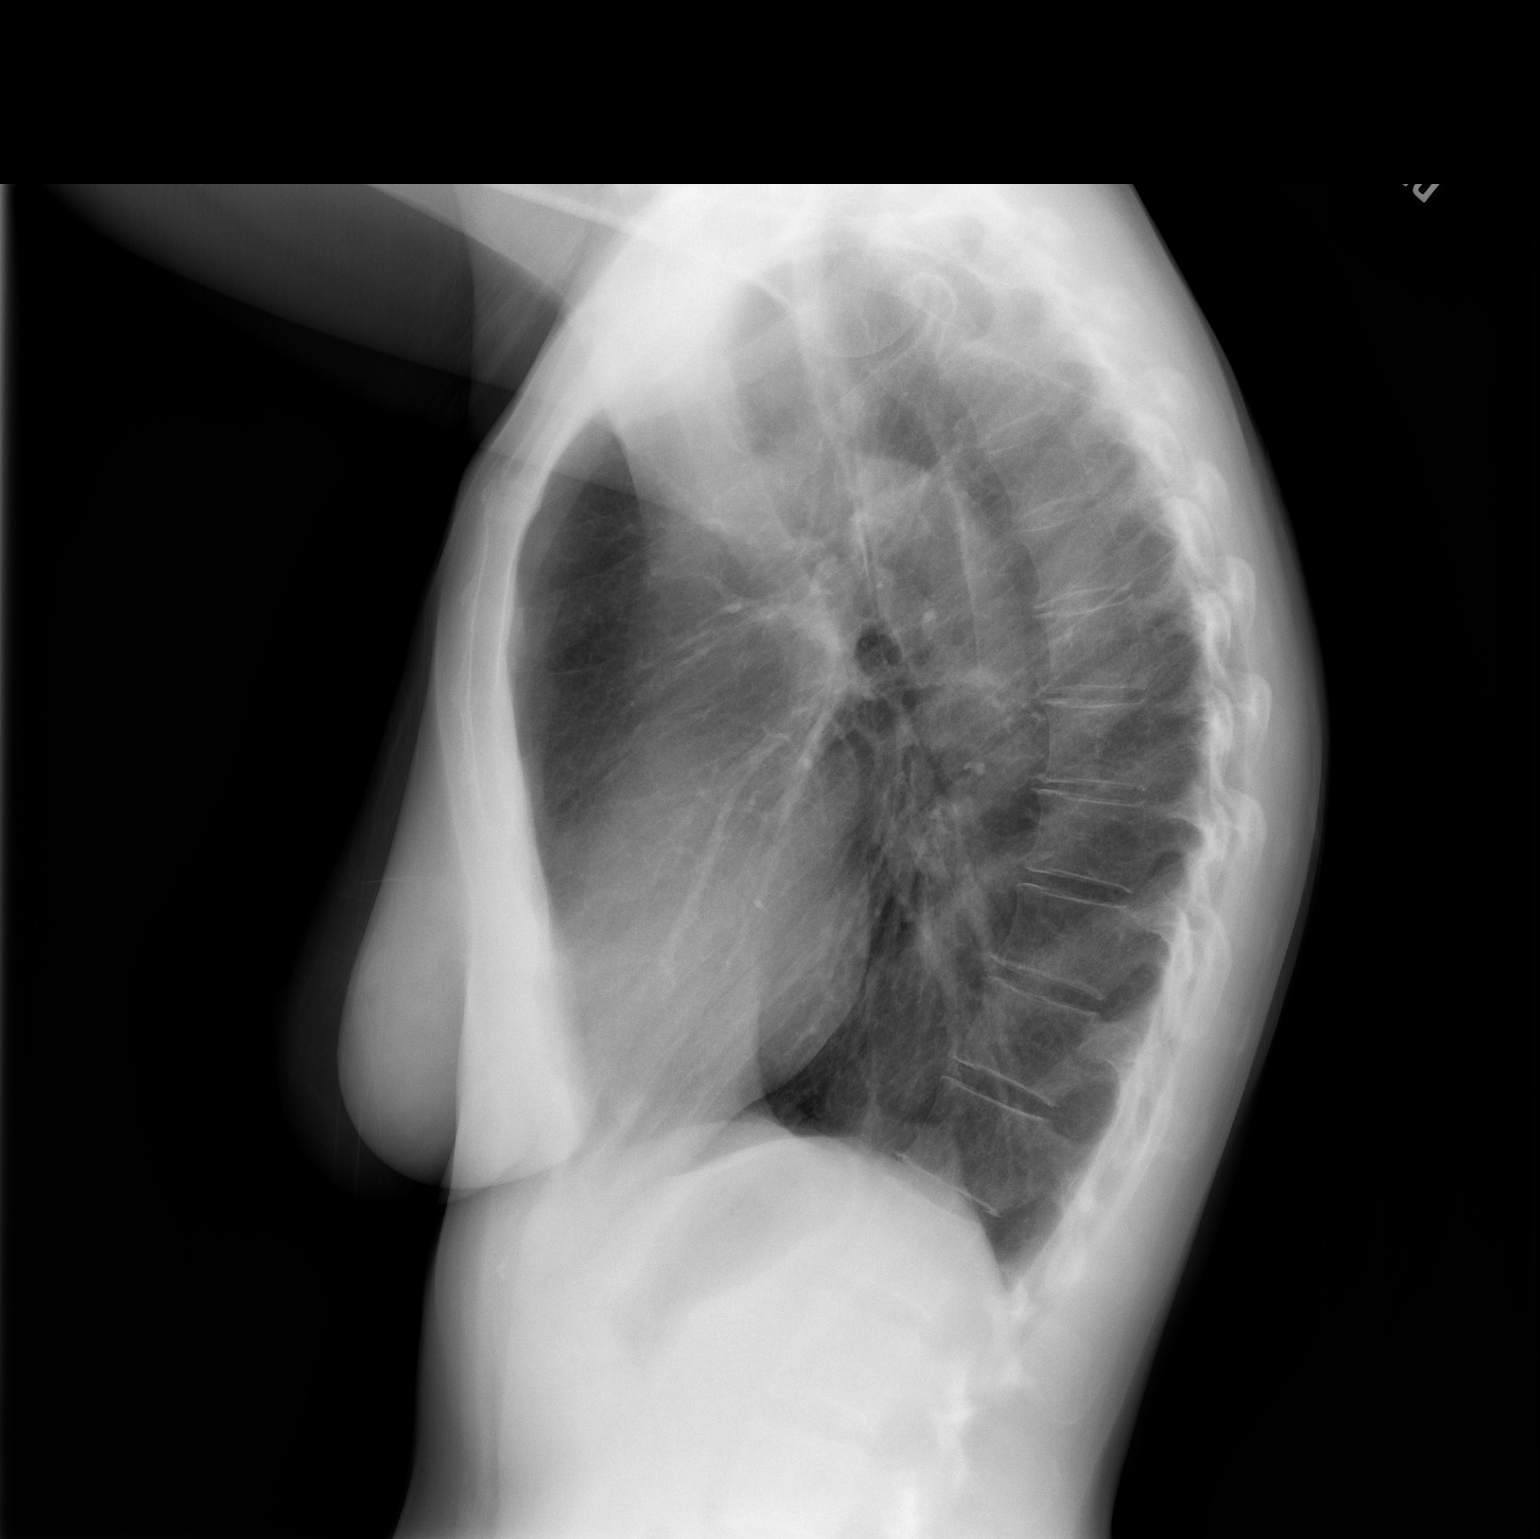

[2 of 2 positions shown; findings below may reference images not displayed]

FINDINGS: Normal heart size, mediastinal contours, and pulmonary vascularity.

Hyperinflated lungs again identified.

No acute infiltrate, pleural effusion or pneumothorax.

Osseous structures unremarkable.
IMPRESSION: Hyperinflated lungs without acute infiltrate.

## 2019-12-12 MED ORDER — SODIUM CHLORIDE 0.9 % IV BOLUS
1000.0000 mL | Freq: Once | INTRAVENOUS | Status: AC
  Administered 2019-12-12: 1000 mL via INTRAVENOUS

## 2019-12-12 MED ORDER — DIPHENHYDRAMINE HCL 50 MG/ML IJ SOLN
12.5000 mg | Freq: Once | INTRAMUSCULAR | Status: AC
  Administered 2019-12-12: 12.5 mg via INTRAVENOUS
  Filled 2019-12-12: qty 1

## 2019-12-12 MED ORDER — KETOROLAC TROMETHAMINE 30 MG/ML IJ SOLN
30.0000 mg | Freq: Once | INTRAMUSCULAR | Status: AC
  Administered 2019-12-12: 30 mg via INTRAVENOUS
  Filled 2019-12-12: qty 1

## 2019-12-12 MED ORDER — PROMETHAZINE HCL 25 MG/ML IJ SOLN
12.5000 mg | Freq: Once | INTRAMUSCULAR | Status: AC
  Administered 2019-12-12: 12.5 mg via INTRAVENOUS
  Filled 2019-12-12: qty 1

## 2019-12-12 NOTE — ED Triage Notes (Addendum)
Pt c/o Cindy Fitzgerald symptoms x 10 days , covid neg, pro cough , fever  And increased SOB , Chest xray today neg
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
## 2019-12-12 NOTE — ED Notes (Signed)
Iv fluids completed , remains in lobby waiting for rm

## 2019-12-12 NOTE — ED Notes (Signed)
Snacks and drinks provided to pt and visitor.

## 2019-12-12 NOTE — Discharge Instructions (Addendum)
You do not have a blood clot in your lungs.  Lab work is reassuring overall.  Follow-up with your doctor.  Try and keep yourself hydrated.

## 2019-12-12 NOTE — ED Notes (Signed)
Pt ambulated with pulse ox 98-100%

## 2019-12-15 DIAGNOSIS — R05 Cough: Secondary | ICD-10-CM | POA: Diagnosis not present

## 2020-01-02 DIAGNOSIS — Z79899 Other long term (current) drug therapy: Secondary | ICD-10-CM | POA: Diagnosis not present

## 2020-02-02 DIAGNOSIS — G47419 Narcolepsy without cataplexy: Secondary | ICD-10-CM | POA: Diagnosis not present

## 2020-02-02 DIAGNOSIS — G4719 Other hypersomnia: Secondary | ICD-10-CM | POA: Diagnosis not present

## 2020-02-02 DIAGNOSIS — G43009 Migraine without aura, not intractable, without status migrainosus: Secondary | ICD-10-CM | POA: Diagnosis not present

## 2020-02-07 DIAGNOSIS — S30861A Insect bite (nonvenomous) of abdominal wall, initial encounter: Secondary | ICD-10-CM | POA: Diagnosis not present

## 2020-02-15 DIAGNOSIS — H2513 Age-related nuclear cataract, bilateral: Secondary | ICD-10-CM | POA: Diagnosis not present

## 2020-02-15 DIAGNOSIS — H524 Presbyopia: Secondary | ICD-10-CM | POA: Diagnosis not present

## 2020-02-20 DIAGNOSIS — M545 Low back pain: Secondary | ICD-10-CM | POA: Diagnosis not present

## 2020-02-20 DIAGNOSIS — M150 Primary generalized (osteo)arthritis: Secondary | ICD-10-CM | POA: Diagnosis not present

## 2020-02-20 DIAGNOSIS — I7300 Raynaud's syndrome without gangrene: Secondary | ICD-10-CM | POA: Diagnosis not present

## 2020-02-20 DIAGNOSIS — M359 Systemic involvement of connective tissue, unspecified: Secondary | ICD-10-CM | POA: Diagnosis not present

## 2020-03-01 DIAGNOSIS — R439 Unspecified disturbances of smell and taste: Secondary | ICD-10-CM | POA: Diagnosis not present

## 2020-03-01 DIAGNOSIS — G4711 Idiopathic hypersomnia with long sleep time: Secondary | ICD-10-CM | POA: Diagnosis not present

## 2020-03-01 DIAGNOSIS — G43009 Migraine without aura, not intractable, without status migrainosus: Secondary | ICD-10-CM | POA: Diagnosis not present

## 2020-03-06 DIAGNOSIS — R431 Parosmia: Secondary | ICD-10-CM | POA: Diagnosis not present

## 2020-03-30 DIAGNOSIS — N898 Other specified noninflammatory disorders of vagina: Secondary | ICD-10-CM | POA: Diagnosis not present

## 2020-03-30 DIAGNOSIS — B373 Candidiasis of vulva and vagina: Secondary | ICD-10-CM | POA: Diagnosis not present

## 2020-04-16 DIAGNOSIS — K648 Other hemorrhoids: Secondary | ICD-10-CM | POA: Diagnosis not present

## 2020-04-23 DIAGNOSIS — J3489 Other specified disorders of nose and nasal sinuses: Secondary | ICD-10-CM | POA: Diagnosis not present

## 2020-04-23 DIAGNOSIS — J321 Chronic frontal sinusitis: Secondary | ICD-10-CM | POA: Diagnosis not present

## 2020-04-23 DIAGNOSIS — R438 Other disturbances of smell and taste: Secondary | ICD-10-CM | POA: Diagnosis not present

## 2020-04-26 ENCOUNTER — Ambulatory Visit
Admission: RE | Admit: 2020-04-26 | Discharge: 2020-04-26 | Disposition: A | Discharge status: Home / Self Care | Payer: Self-pay | Source: Ambulatory Visit | Attending: Family Medicine | Admitting: Family Medicine

## 2020-04-26 ENCOUNTER — Other Ambulatory Visit: Payer: Self-pay | Admitting: Family Medicine

## 2020-04-26 DIAGNOSIS — M79645 Pain in left finger(s): Secondary | ICD-10-CM

## 2020-04-26 DIAGNOSIS — S60042A Contusion of left ring finger without damage to nail, initial encounter: Secondary | ICD-10-CM | POA: Diagnosis not present

## 2020-04-26 DIAGNOSIS — S6992XA Unspecified injury of left wrist, hand and finger(s), initial encounter: Secondary | ICD-10-CM | POA: Diagnosis not present

## 2020-04-26 IMAGING — CR DG FINGER RING 2+V*L*
3 series · 3 of 3 positions shown · non-contrast
Comparison: None.

CLINICAL DATA: Injury, bruising and pain

EXAM:
LEFT RING FINGER 2+V

[x finger pa left]
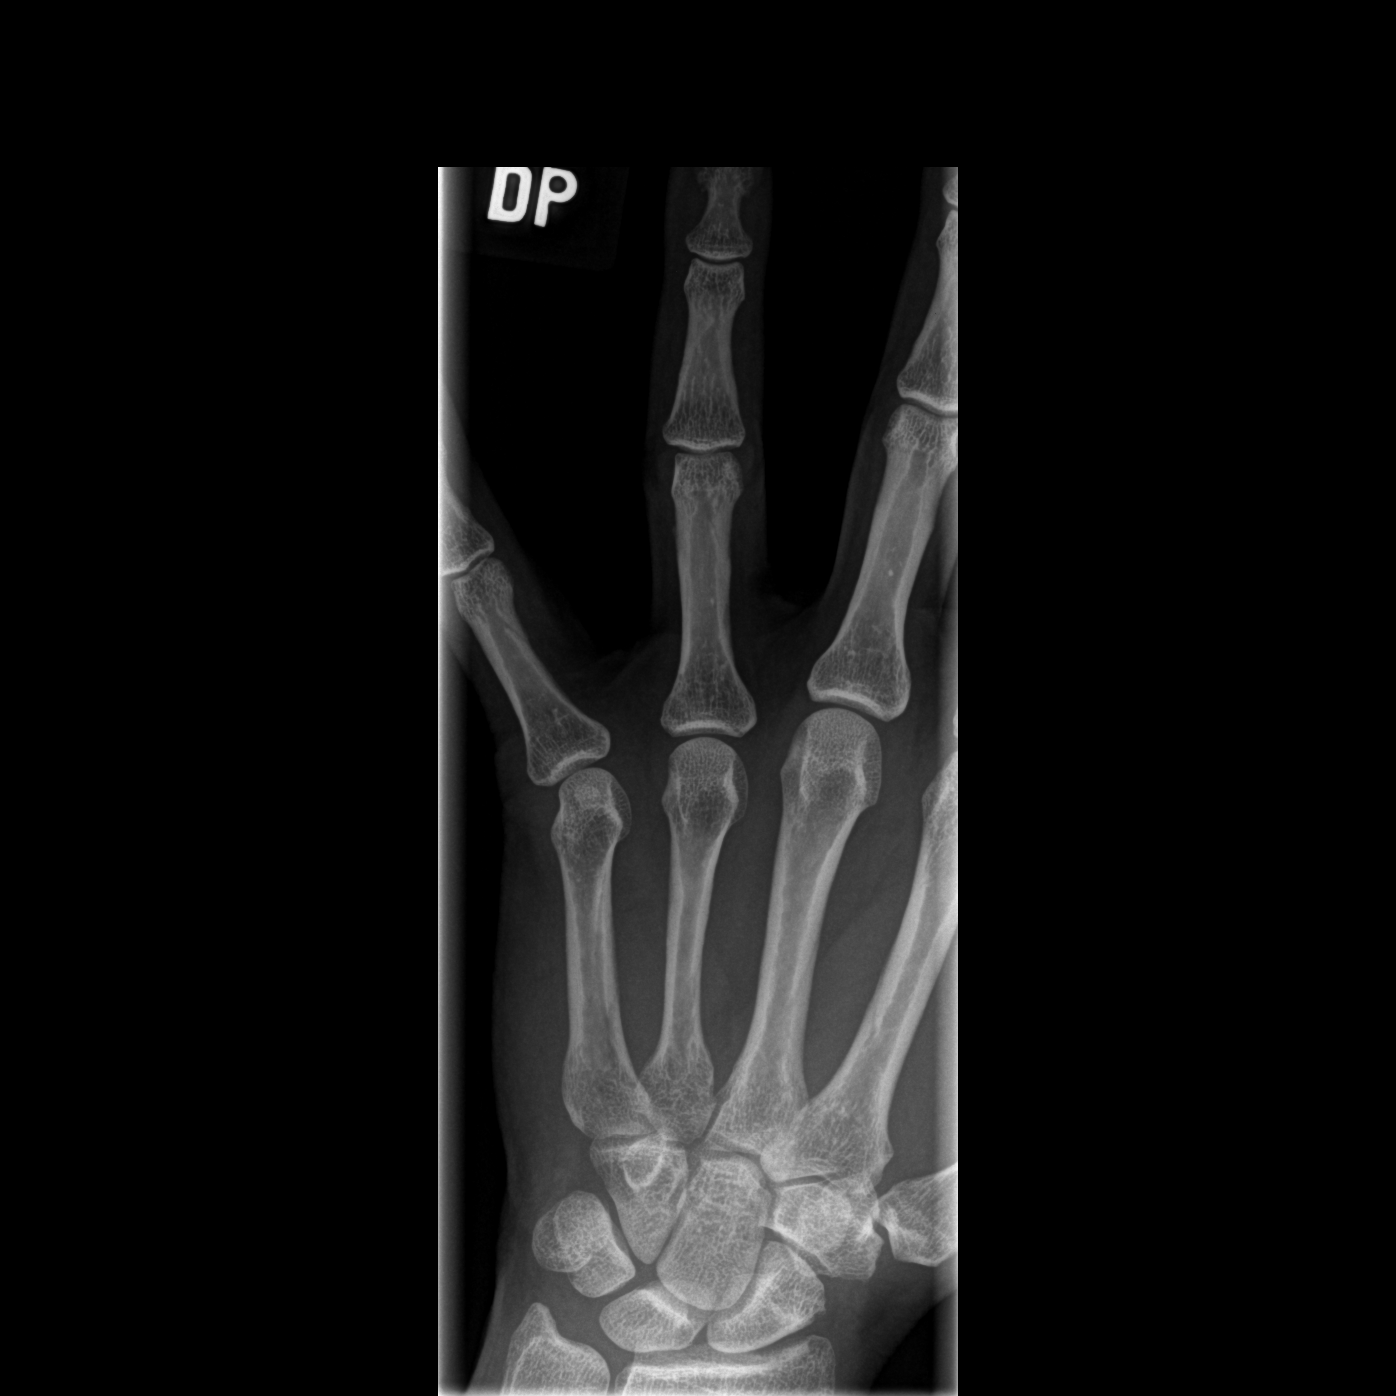

[x finger obl. left]
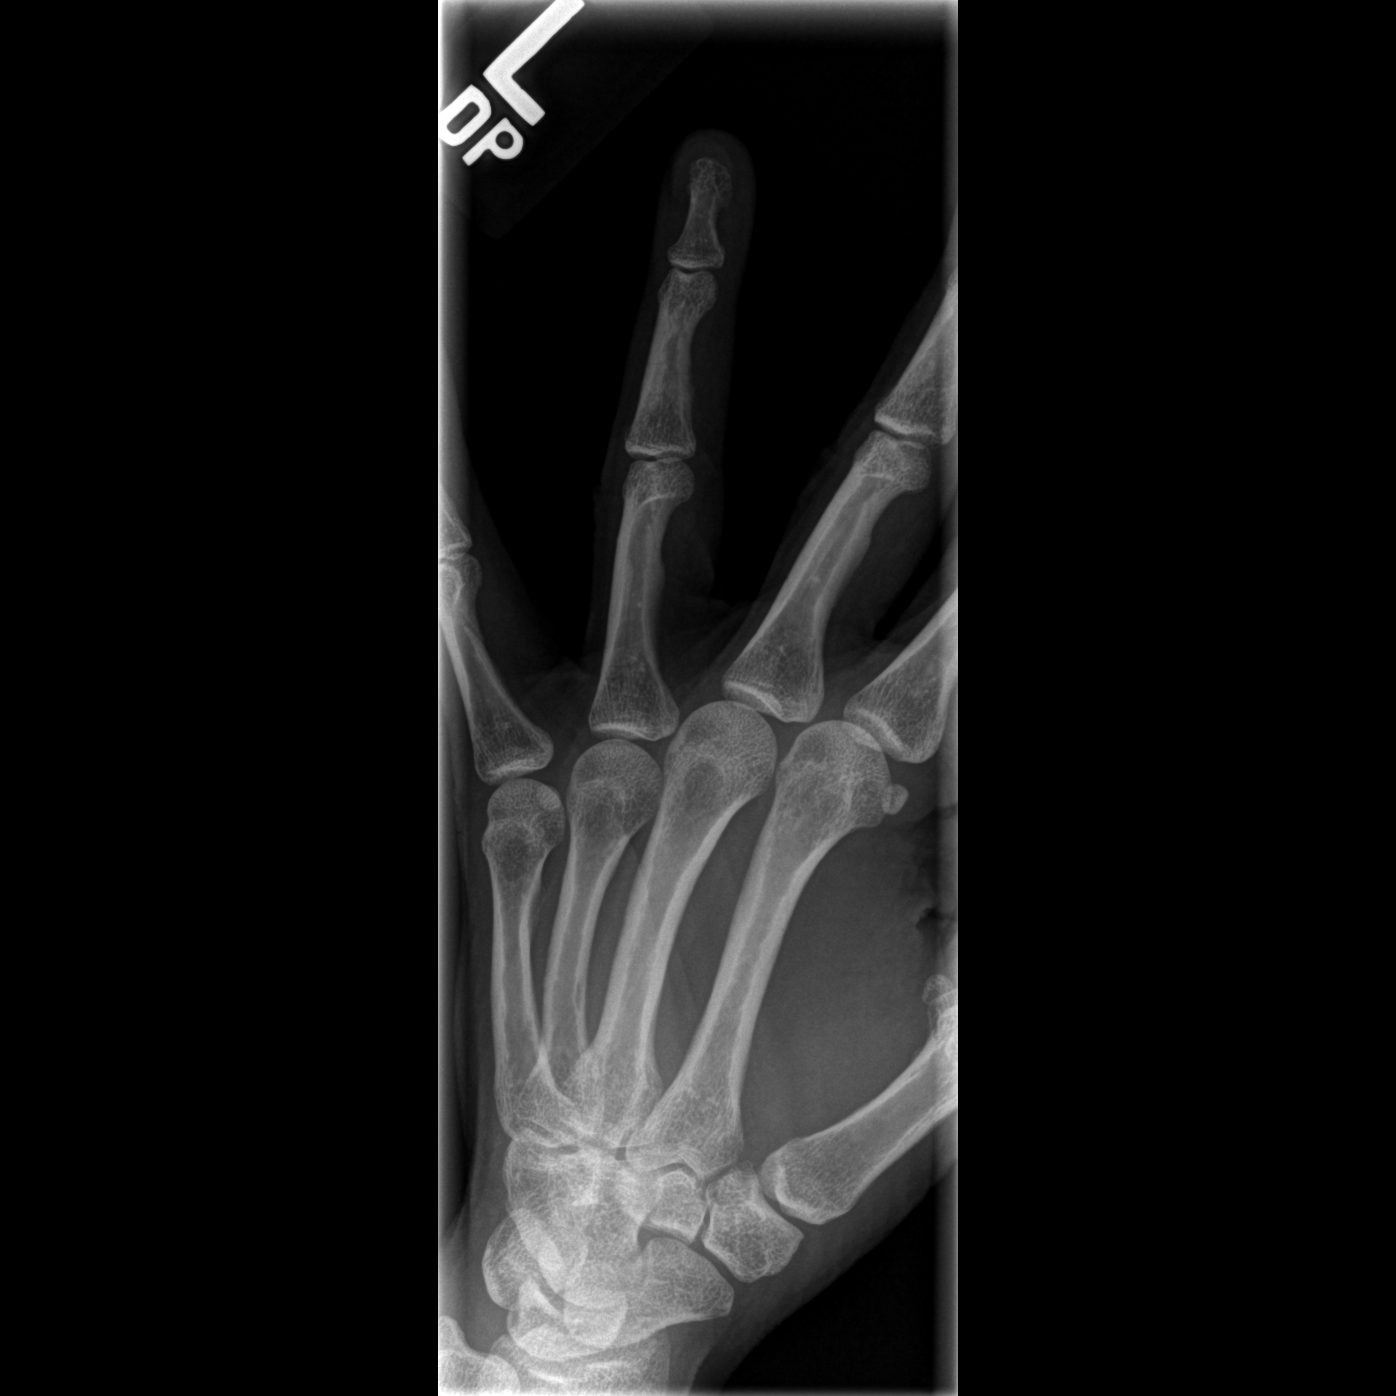

[x finger lateral left]
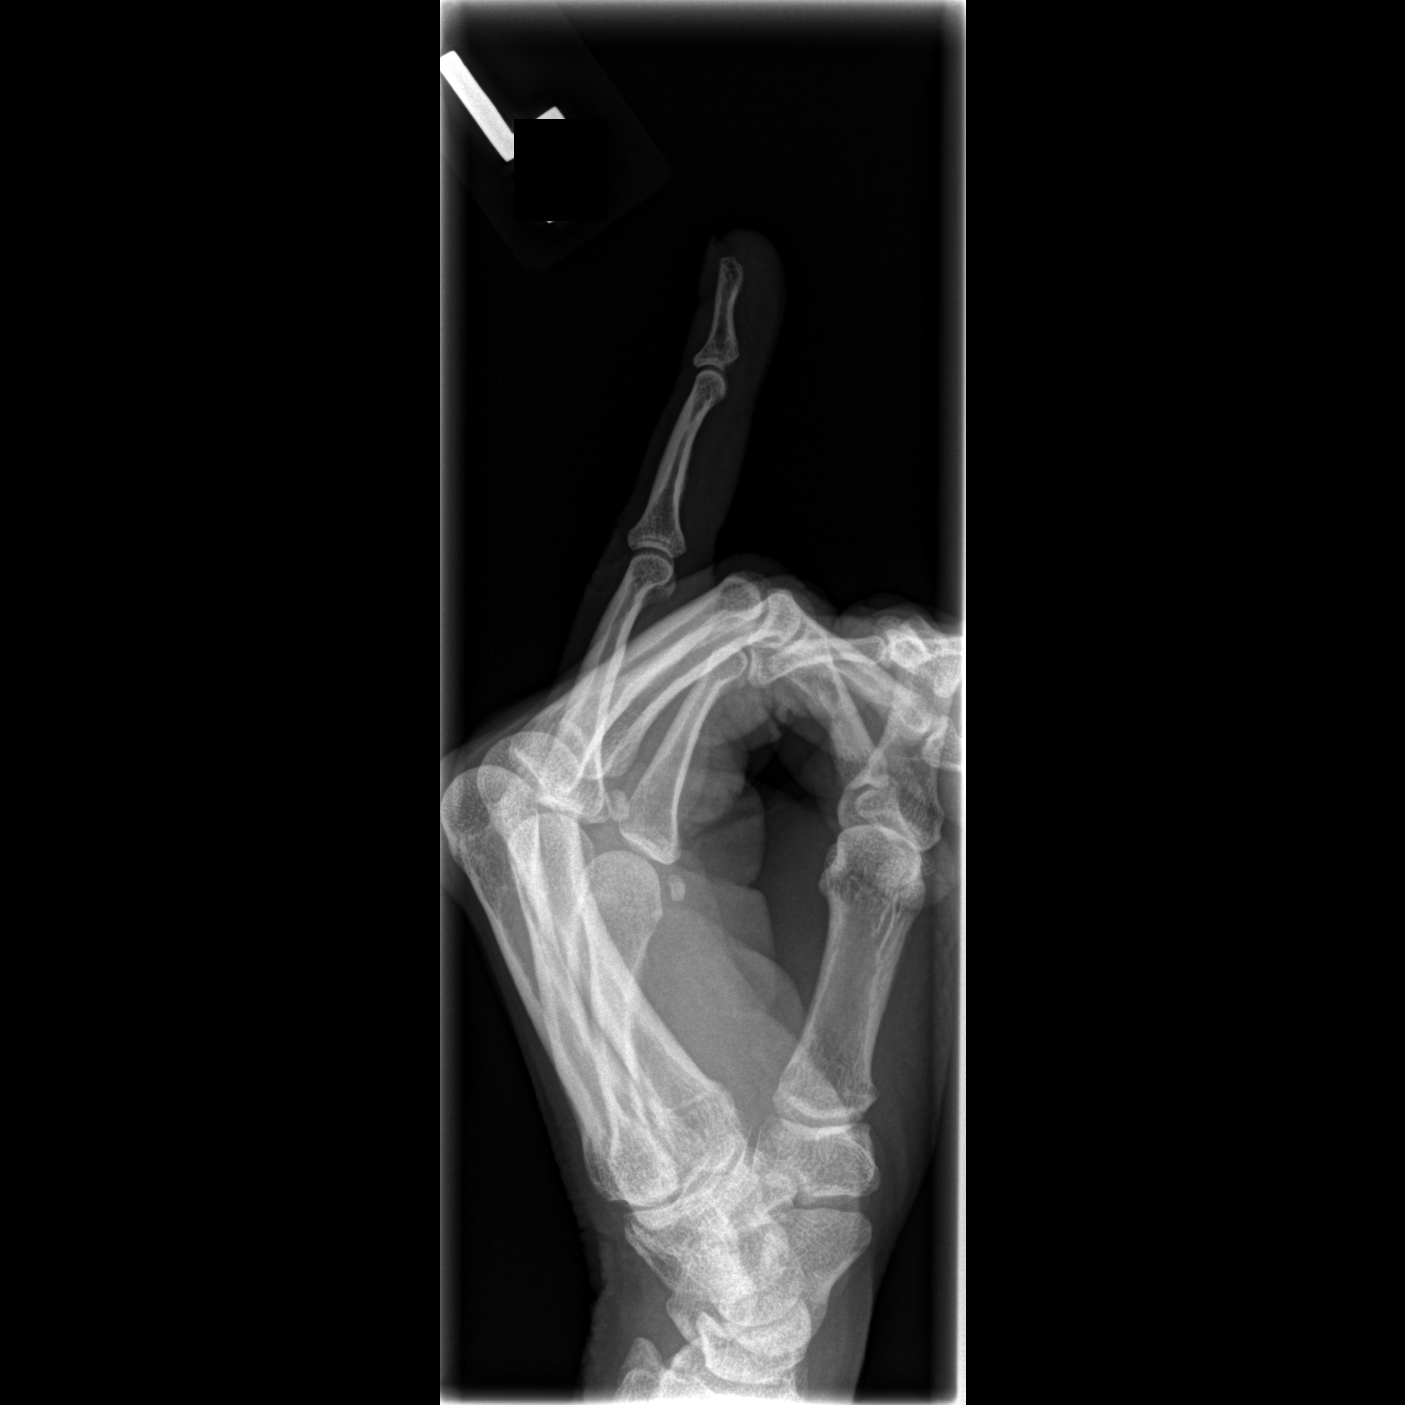

[3 of 3 positions shown; findings below may reference images not displayed]

FINDINGS: There is no evidence of fracture or dislocation. There is no
evidence of arthropathy or other focal bone abnormality. Soft
tissues are unremarkable.
IMPRESSION: Negative.

## 2020-04-27 DIAGNOSIS — D225 Melanocytic nevi of trunk: Secondary | ICD-10-CM | POA: Diagnosis not present

## 2020-04-27 DIAGNOSIS — Z1283 Encounter for screening for malignant neoplasm of skin: Secondary | ICD-10-CM | POA: Diagnosis not present

## 2020-05-14 ENCOUNTER — Other Ambulatory Visit: Payer: Self-pay | Admitting: Family Medicine

## 2020-05-14 DIAGNOSIS — E2839 Other primary ovarian failure: Secondary | ICD-10-CM

## 2020-06-05 ENCOUNTER — Other Ambulatory Visit: Payer: BLUE CROSS/BLUE SHIELD

## 2020-06-05 DIAGNOSIS — Z20822 Contact with and (suspected) exposure to covid-19: Secondary | ICD-10-CM

## 2020-06-07 LAB — NOVEL CORONAVIRUS, NAA: SARS-CoV-2, NAA: NOT DETECTED

## 2020-06-07 LAB — SARS-COV-2, NAA 2 DAY TAT

## 2020-06-08 DIAGNOSIS — J22 Unspecified acute lower respiratory infection: Secondary | ICD-10-CM | POA: Diagnosis not present

## 2020-06-15 ENCOUNTER — Ambulatory Visit
Admission: RE | Admit: 2020-06-15 | Discharge: 2020-06-15 | Disposition: A | Discharge status: Home / Self Care | Payer: BLUE CROSS/BLUE SHIELD | Source: Ambulatory Visit | Attending: Family Medicine | Admitting: Family Medicine

## 2020-06-15 ENCOUNTER — Other Ambulatory Visit: Payer: Self-pay

## 2020-06-15 ENCOUNTER — Other Ambulatory Visit: Payer: Self-pay | Admitting: Family Medicine

## 2020-06-15 DIAGNOSIS — Z1231 Encounter for screening mammogram for malignant neoplasm of breast: Secondary | ICD-10-CM | POA: Diagnosis not present

## 2020-06-15 IMAGING — MG MM DIGITAL SCREENING BILAT W/ TOMO AND CAD
8 series · 9 of 24 positions shown · non-contrast
Comparison: Previous exam(s).

CLINICAL DATA: Screening.

EXAM:
DIGITAL SCREENING BILATERAL MAMMOGRAM WITH TOMO AND CAD

[L MLO synth-2D]
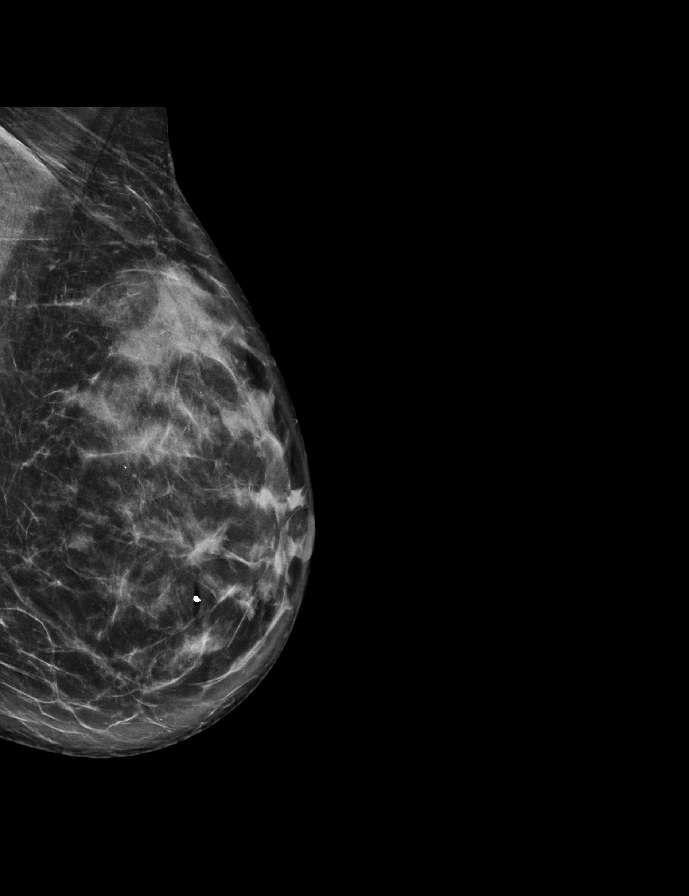

[R MLO synth-2D]
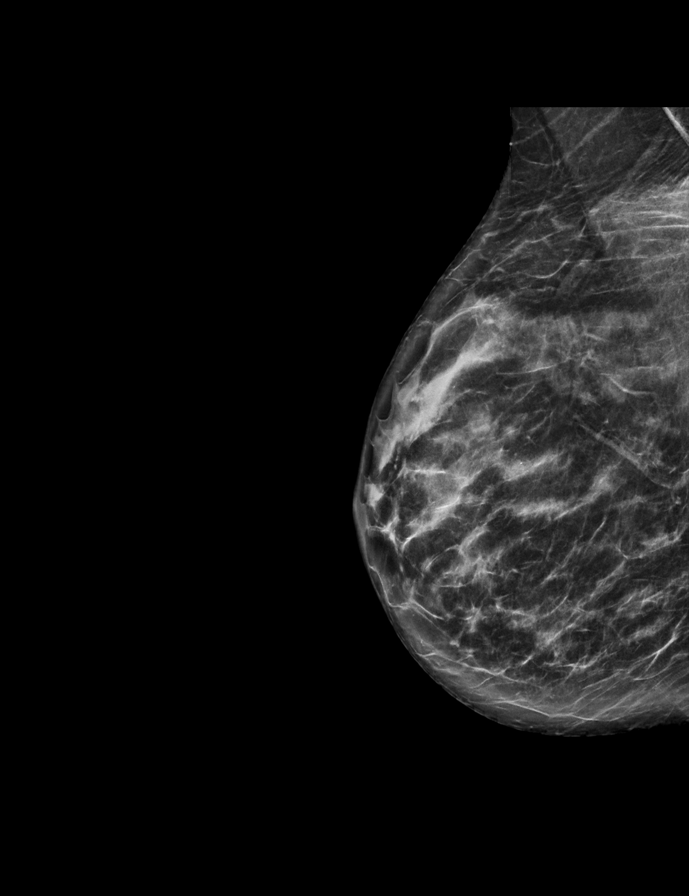

[L CC synth-2D]
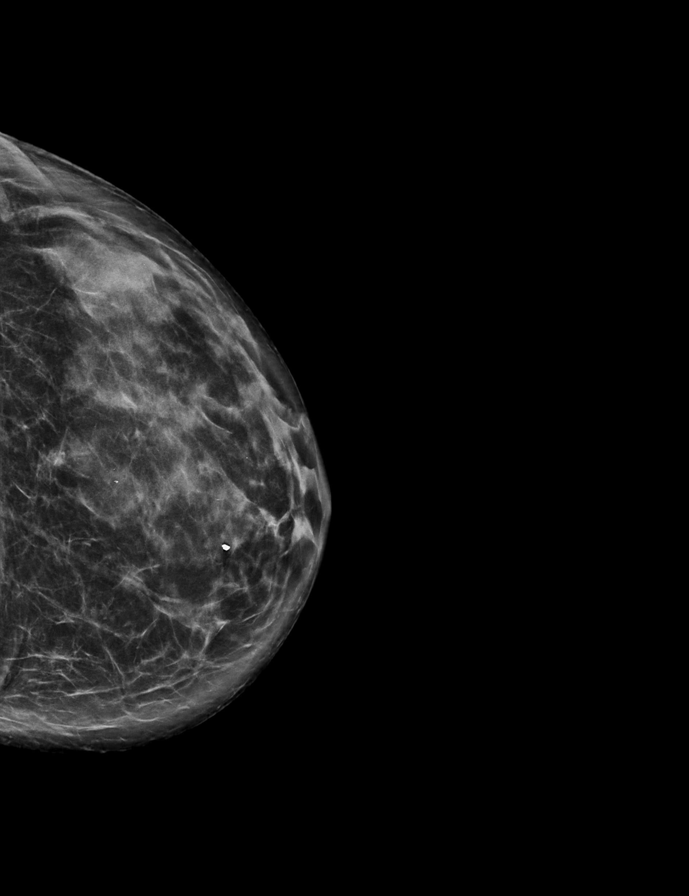

[R CC synth-2D]
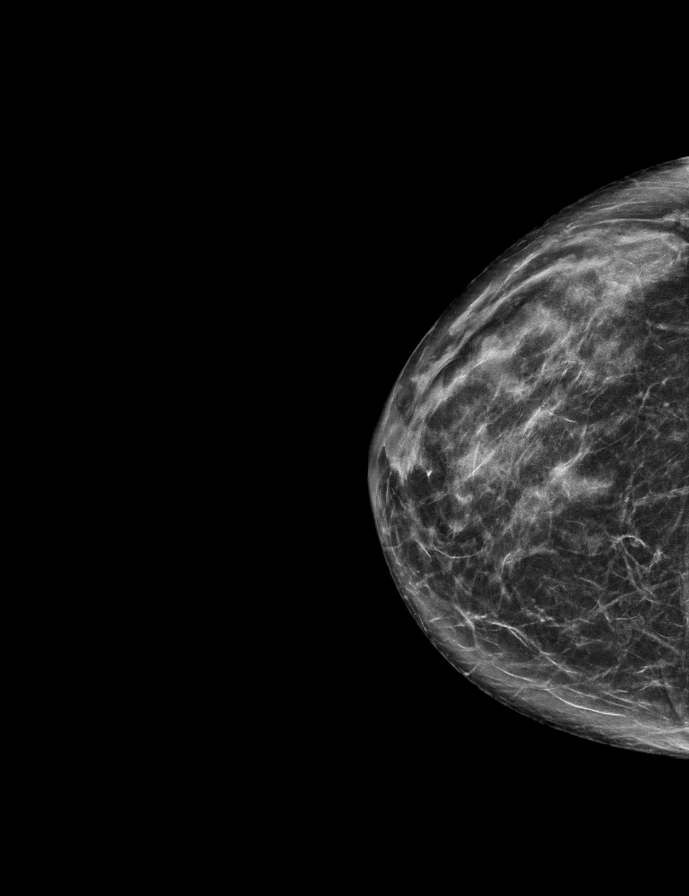

[R CC tomo · 2 of 64 frames shown]
[frame 21/64]
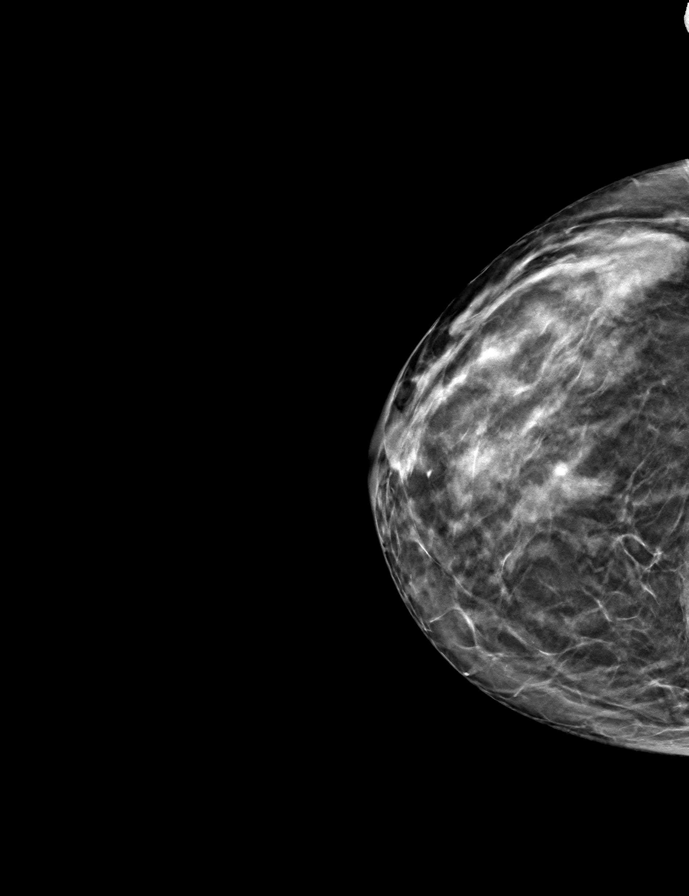
[frame 33/64]
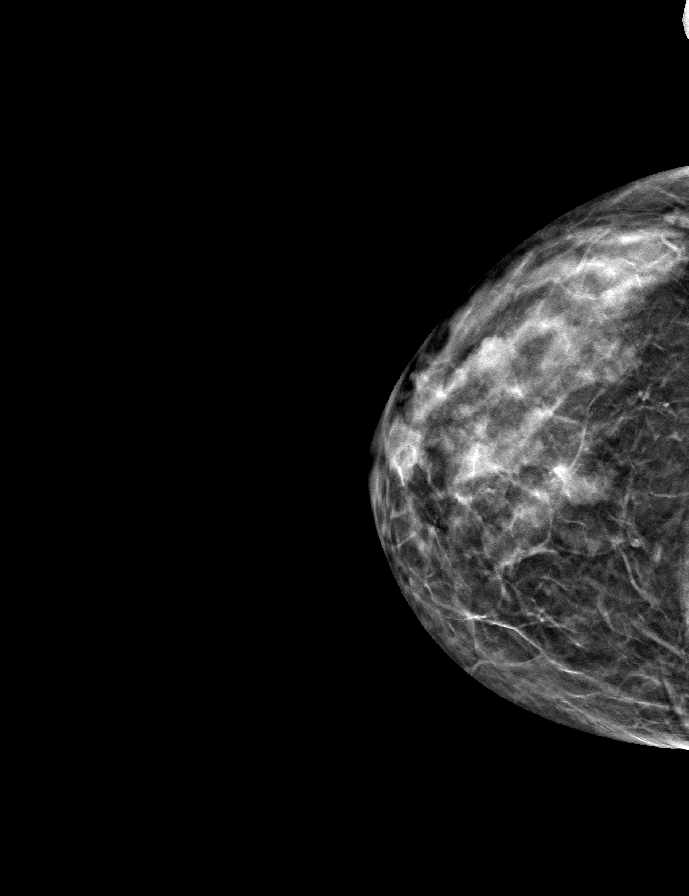

[L MLO tomo · tomo slice 33/65.0]
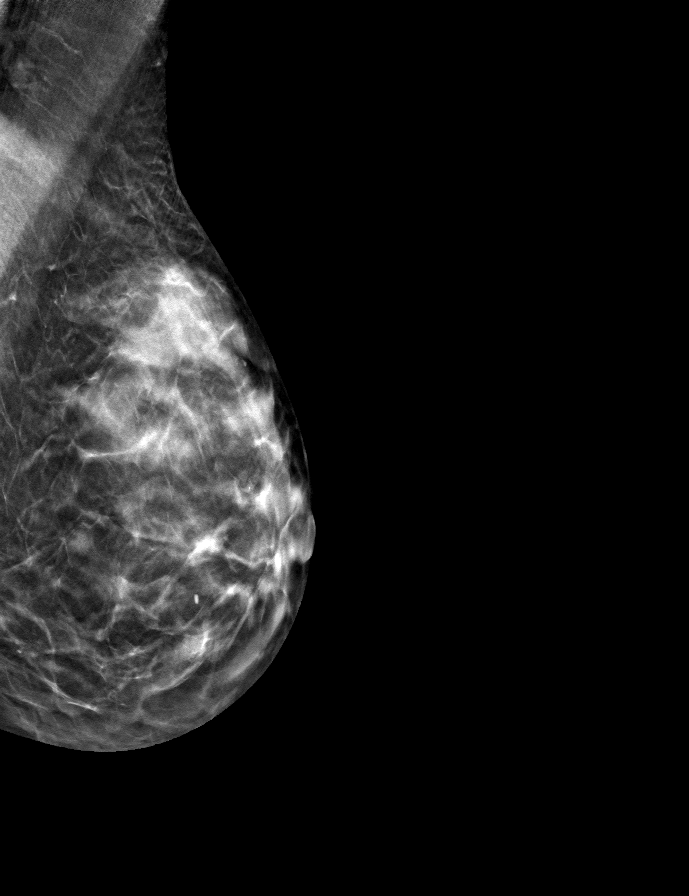

[L CC tomo · tomo slice 34/67.0]
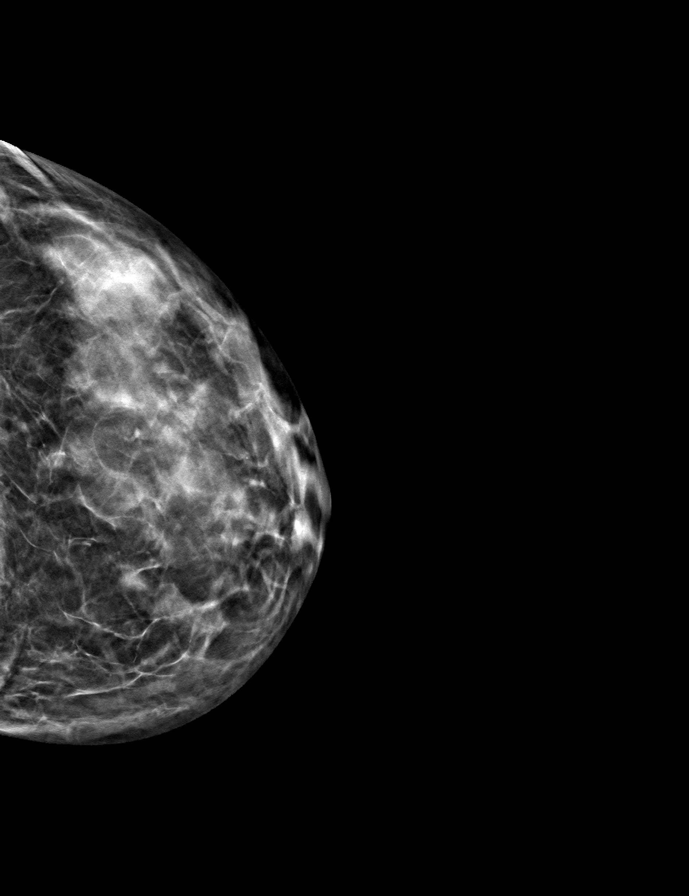

[R MLO tomo · tomo slice 33/64.0]
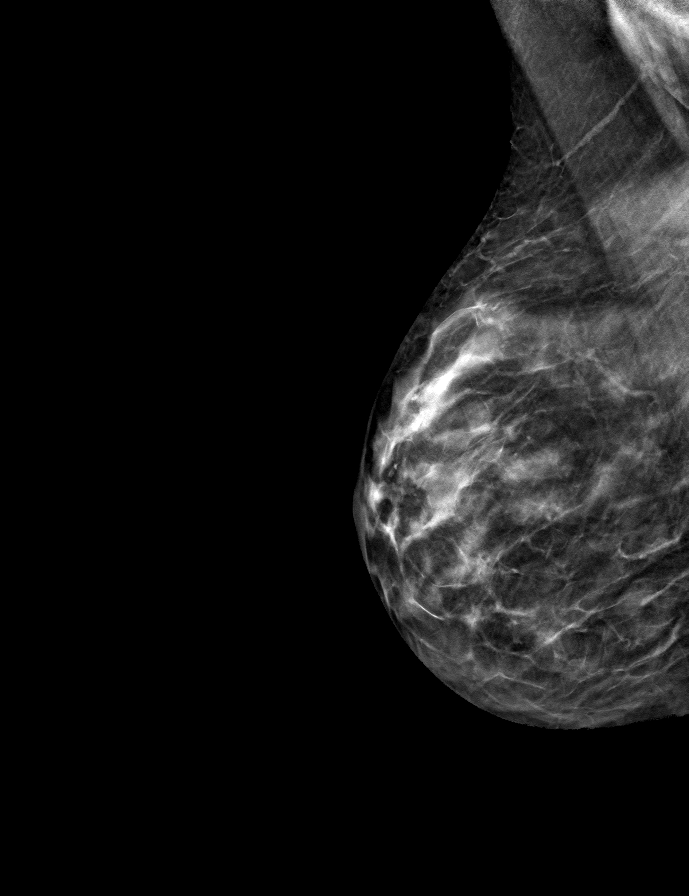

[9 of 24 positions shown; findings below may reference images not displayed]

ACR Breast Density Category c: The breast tissue is heterogeneously
dense, which may obscure small masses.
FINDINGS: In the right breast an asymmetry requires further evaluation.

In the left breast an asymmetry requires further evaluation.

The images were evaluated with computer-aided detection.
IMPRESSION: Further evaluation is suggested for an asymmetry in the right
breast.

Further evaluation is suggested for an asymmetry in the left breast.

RECOMMENDATION:
Diagnostic mammogram and possibly ultrasound of both breasts.
(Code:[Y2])

The patient will be contacted regarding the findings, and additional
imaging will be scheduled.

BI-RADS CATEGORY  0: Incomplete. Need additional imaging evaluation
and/or prior mammograms for comparison.

## 2020-06-18 ENCOUNTER — Other Ambulatory Visit: Payer: Self-pay | Admitting: Family Medicine

## 2020-06-18 DIAGNOSIS — R928 Other abnormal and inconclusive findings on diagnostic imaging of breast: Secondary | ICD-10-CM

## 2020-06-19 ENCOUNTER — Other Ambulatory Visit: Payer: Self-pay | Admitting: Obstetrics

## 2020-06-19 DIAGNOSIS — R922 Inconclusive mammogram: Secondary | ICD-10-CM

## 2020-06-22 ENCOUNTER — Other Ambulatory Visit: Payer: Self-pay | Admitting: Family Medicine

## 2020-06-22 ENCOUNTER — Ambulatory Visit
Admission: RE | Admit: 2020-06-22 | Discharge: 2020-06-22 | Disposition: A | Discharge status: Home / Self Care | Payer: BLUE CROSS/BLUE SHIELD | Source: Ambulatory Visit | Attending: Family Medicine | Admitting: Family Medicine

## 2020-06-22 ENCOUNTER — Ambulatory Visit: Payer: BLUE CROSS/BLUE SHIELD

## 2020-06-22 ENCOUNTER — Other Ambulatory Visit: Payer: Self-pay

## 2020-06-22 DIAGNOSIS — N6011 Diffuse cystic mastopathy of right breast: Secondary | ICD-10-CM | POA: Diagnosis not present

## 2020-06-22 DIAGNOSIS — R928 Other abnormal and inconclusive findings on diagnostic imaging of breast: Secondary | ICD-10-CM

## 2020-06-22 DIAGNOSIS — N6012 Diffuse cystic mastopathy of left breast: Secondary | ICD-10-CM | POA: Diagnosis not present

## 2020-06-22 DIAGNOSIS — R922 Inconclusive mammogram: Secondary | ICD-10-CM | POA: Diagnosis not present

## 2020-06-22 IMAGING — US US BREAST*R* LIMITED INC AXILLA
1 series · 10 of 10 positions shown · non-contrast
Comparison: None.

CLINICAL DATA: 59-year-old female recalled from screening mammogram
dated [DATE] for possible bilateral asymmetries. The patient
also has focal tenderness along the outer left breast.

EXAM:
DIGITAL DIAGNOSTIC BILATERAL MAMMOGRAM WITH CAD AND TOMOSYNTHESIS
ULTRASOUND BILATERAL BREAST
TECHNIQUE: Bilateral digital diagnostic mammography and breast tomosynthesis
was performed. Digital images of the breasts were evaluated with
computer-aided detection. Targeted ultrasound examination of the
Bilateral breast was performed.

[Series 1: us breast*right* limited inc axilla · 0.06mm/px · 10 of 10 slices shown]
[im 1/10]
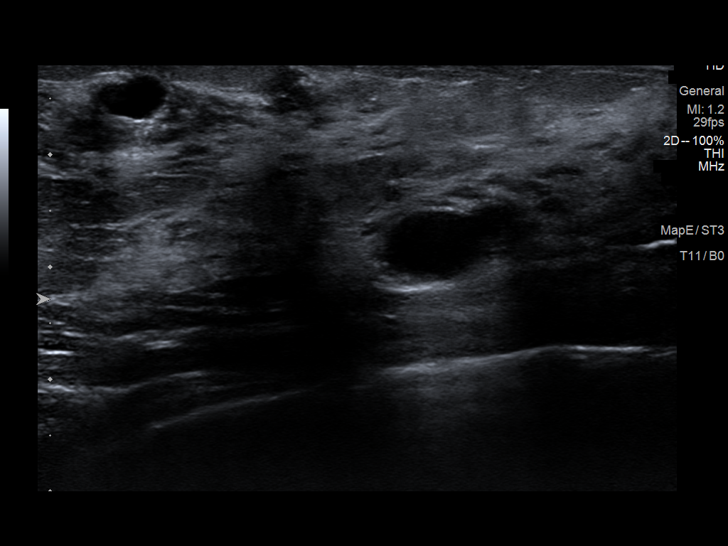
[im 2/10]
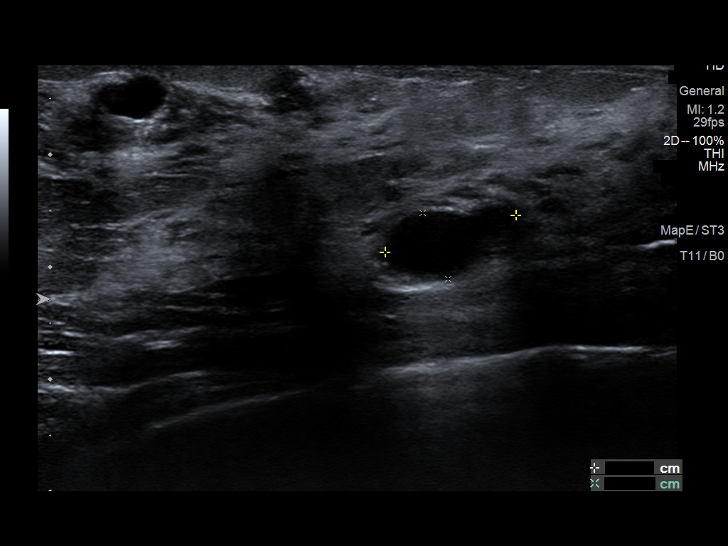
[im 3/10]
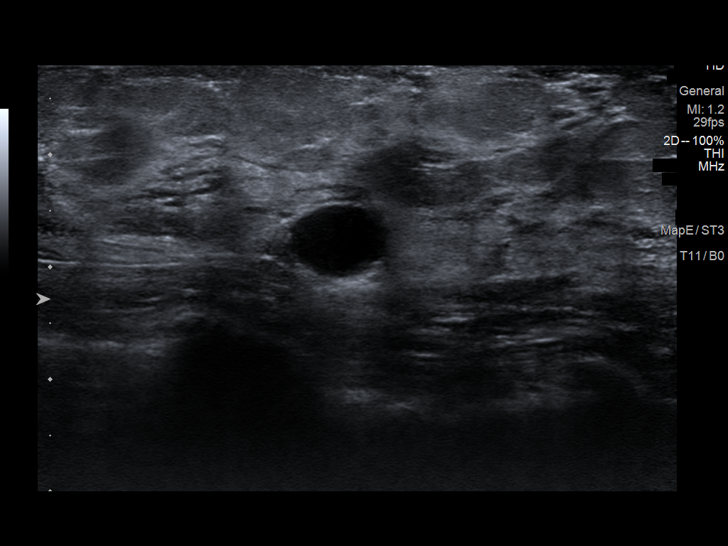
[im 4/10]
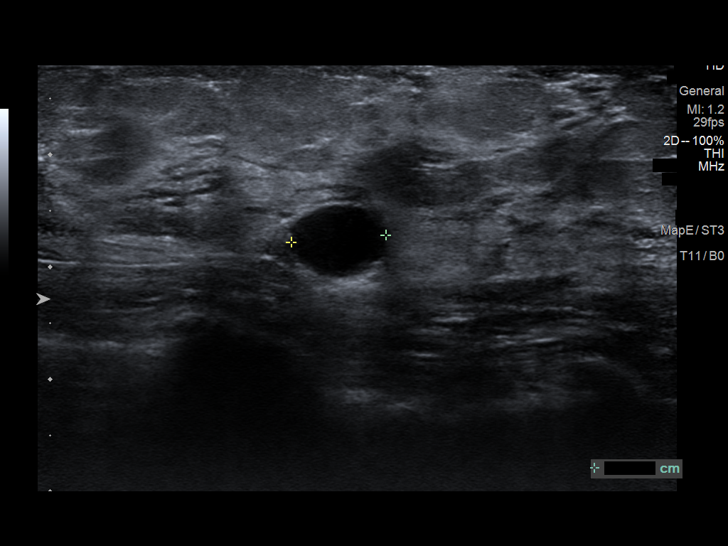
[im 5/10]
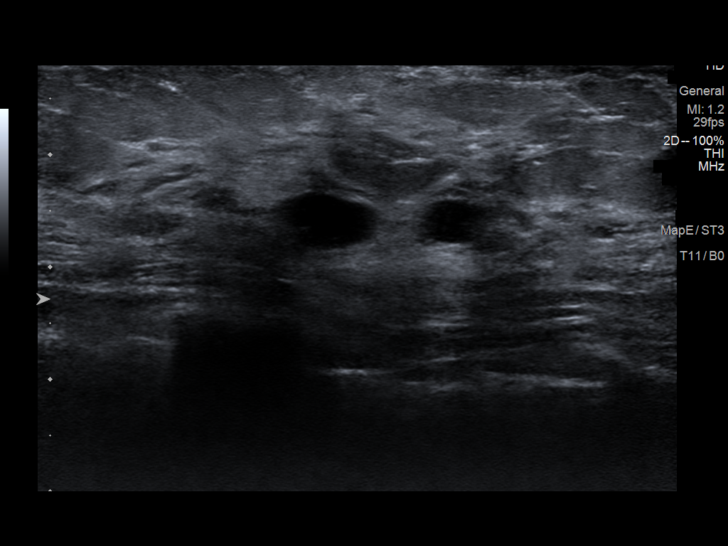
[im 6/10]
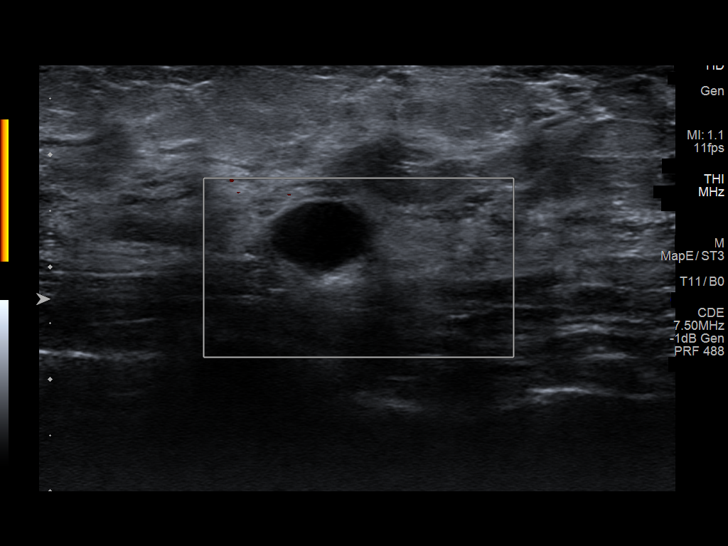
[im 7/10]
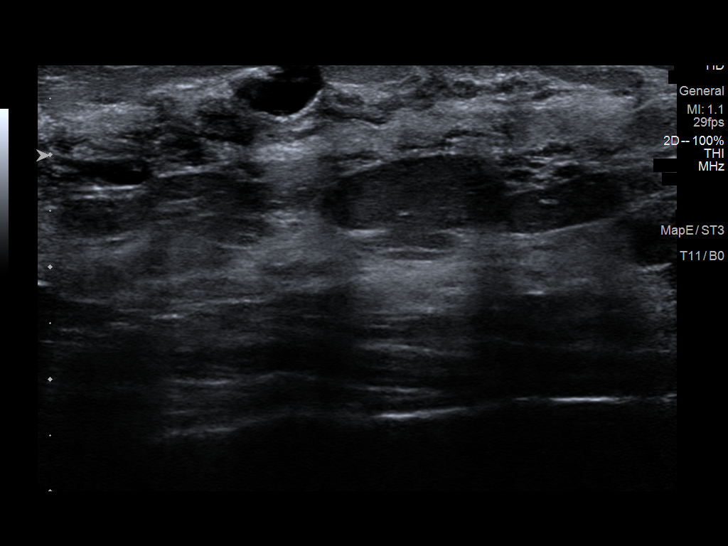
[im 8/10]
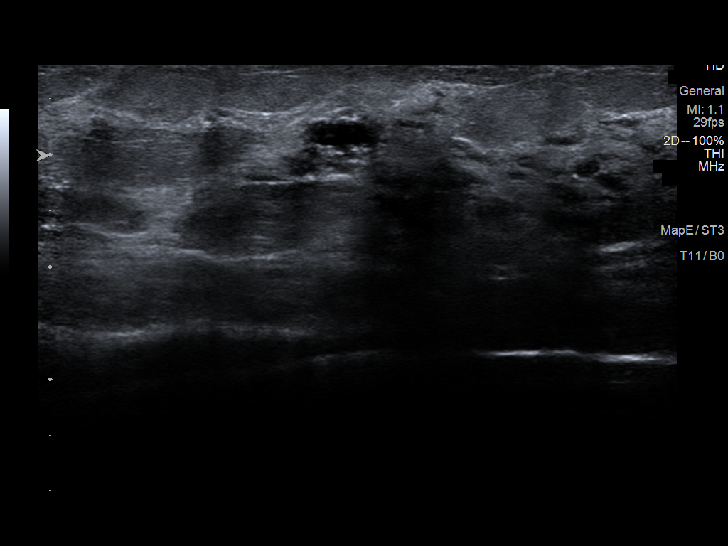
[im 9/10]
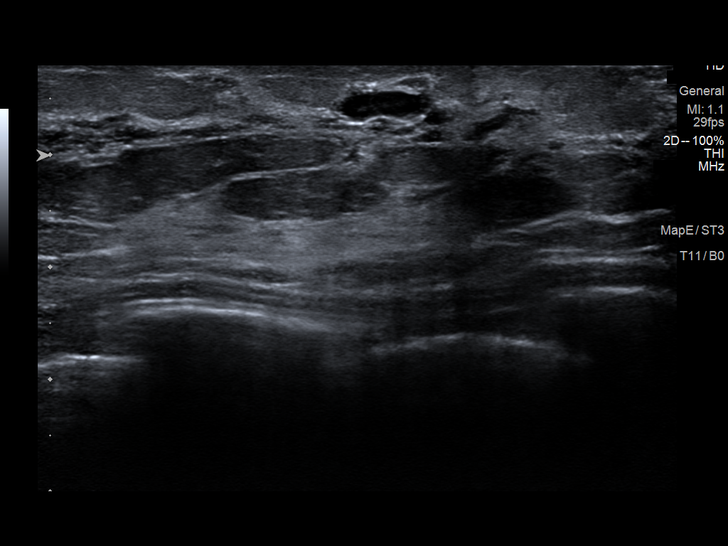
[im 10/10]
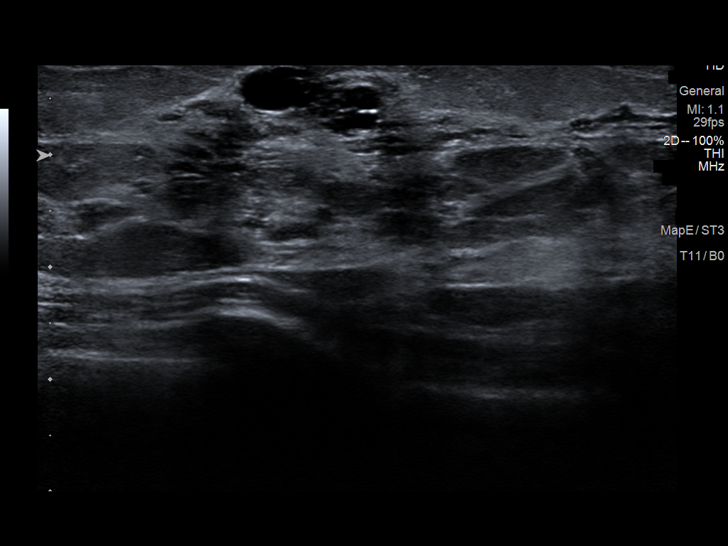

[10 of 10 positions shown; findings below may reference images not displayed]

ACR Breast Density Category c: The breast tissue is heterogeneously
dense, which may obscure small masses.
FINDINGS: Previously described, possible asymmetry in the far posterior, upper
right breast on the MLO projection only does not persist on today's
additional views. This likely represented overlapping from a skin
fold.

Possible asymmetry in the upper left breast on the MLO projection
also resolves into well dispersed fibroglandular tissue on today's
additional views. Given the patient's breast density, precautionary
ultrasound was performed.

Targeted ultrasound is performed, showing numerous simple and
minimally complicated cysts scattered throughout the bilateral
breasts. The largest on the right side is located at the 12 o'clock
position 1 cm from the nipple measuring 1.2 x 0.8 x 0.6 cm. The
largest on the left side is noted at the 3 o'clock position 2 cm
from the nipple. It measures 9 x 8 x 5 mm. No additional suspicious
findings along the lateral left breast at the site of the patient's
focal tenderness.
IMPRESSION: 1. No mammographic evidence of malignancy in either breast.
2. Benign bilateral fibrocystic changes.

RECOMMENDATION:
Screening mammogram in one year.(Code:[BZ])

I have discussed the findings and recommendations with the patient.
If applicable, a reminder letter will be sent to the patient
regarding the next appointment.

BI-RADS CATEGORY  2: Benign.

## 2020-06-22 IMAGING — US US BREAST*L* LIMITED INC AXILLA
1 series · 11 of 11 positions shown · non-contrast
Comparison: None.

CLINICAL DATA: 59-year-old female recalled from screening mammogram
dated [DATE] for possible bilateral asymmetries. The patient
also has focal tenderness along the outer left breast.

EXAM:
DIGITAL DIAGNOSTIC BILATERAL MAMMOGRAM WITH CAD AND TOMOSYNTHESIS
ULTRASOUND BILATERAL BREAST
TECHNIQUE: Bilateral digital diagnostic mammography and breast tomosynthesis
was performed. Digital images of the breasts were evaluated with
computer-aided detection. Targeted ultrasound examination of the
Bilateral breast was performed.

[Series 1: us breast*left* limited inc axilla · 0.06mm/px · 11 of 11 slices shown]
[im 1/11]
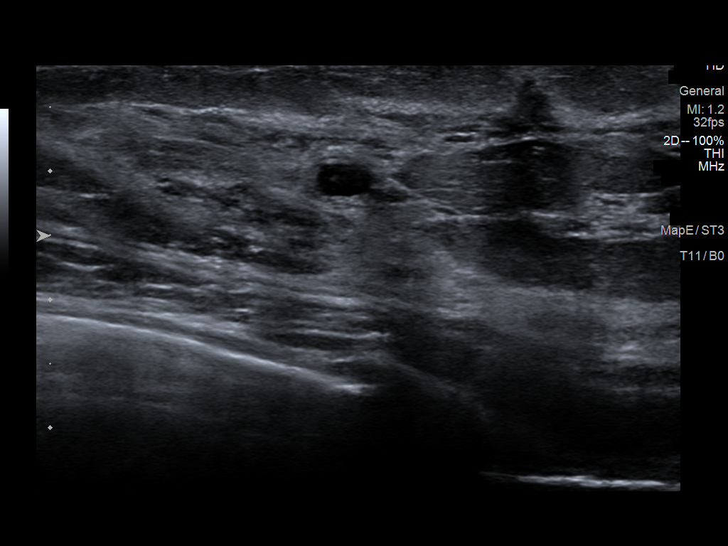
[im 2/11]
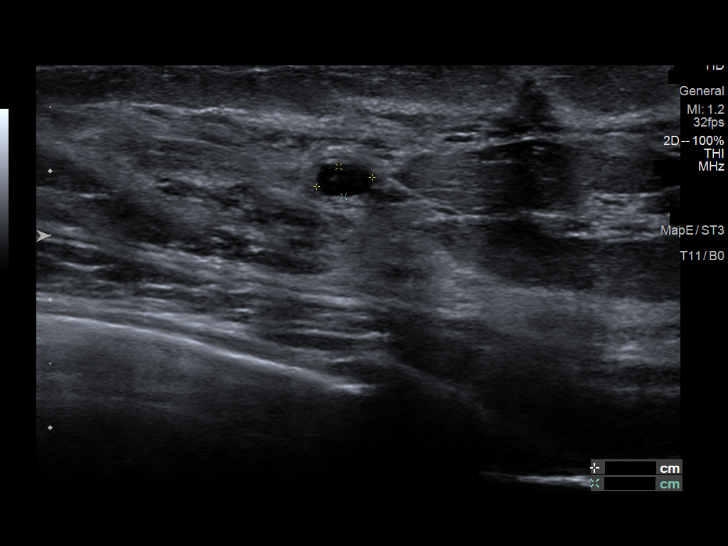
[im 3/11]
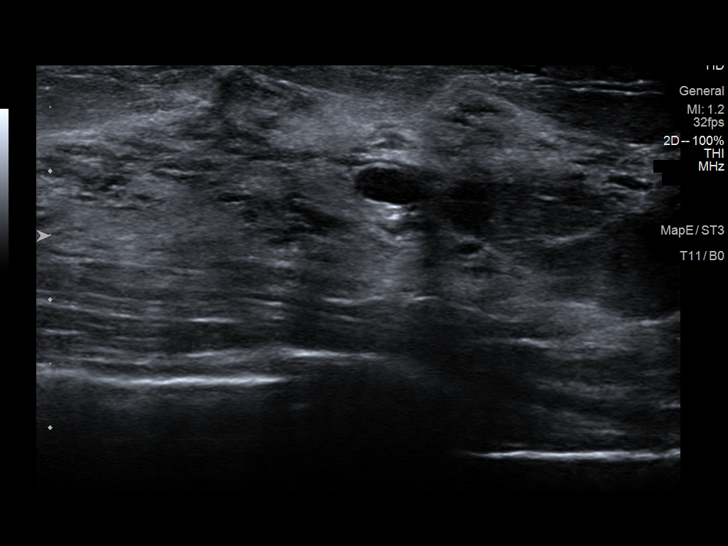
[im 4/11]
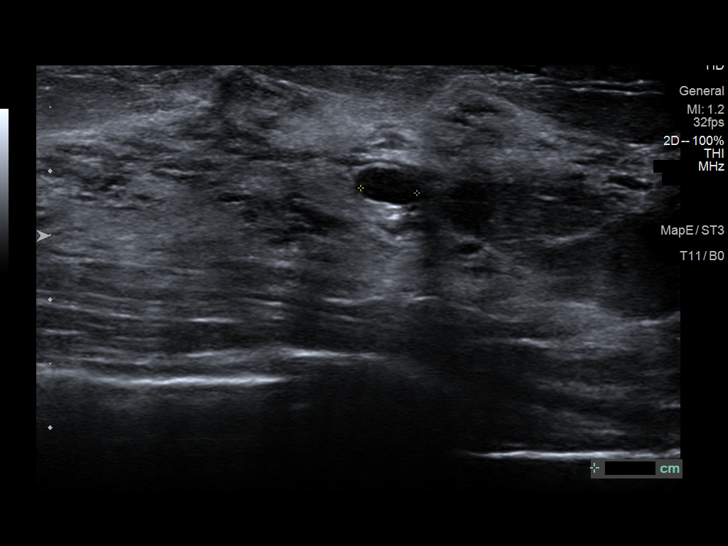
[im 5/11]
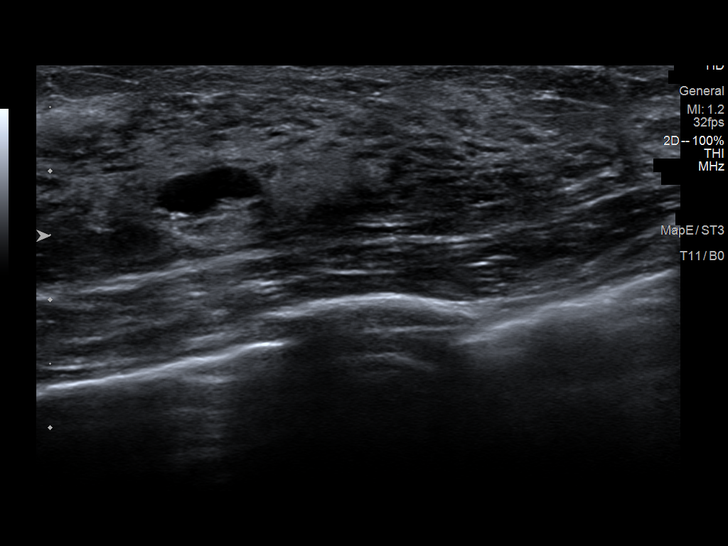
[im 6/11]
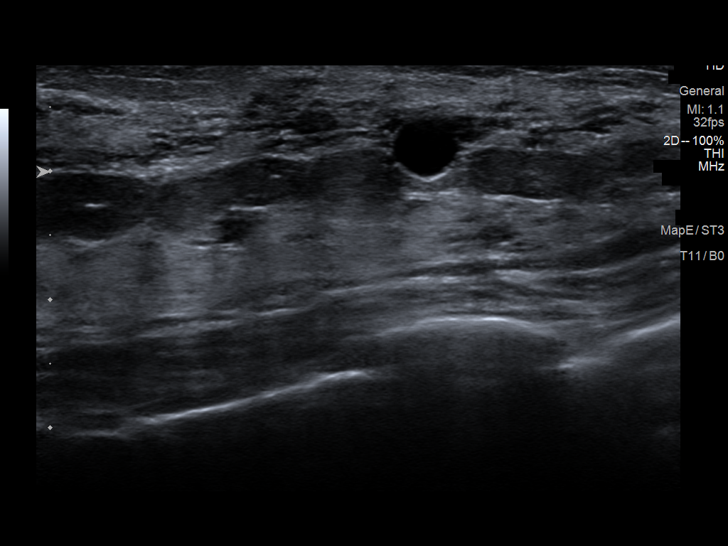
[im 7/11]
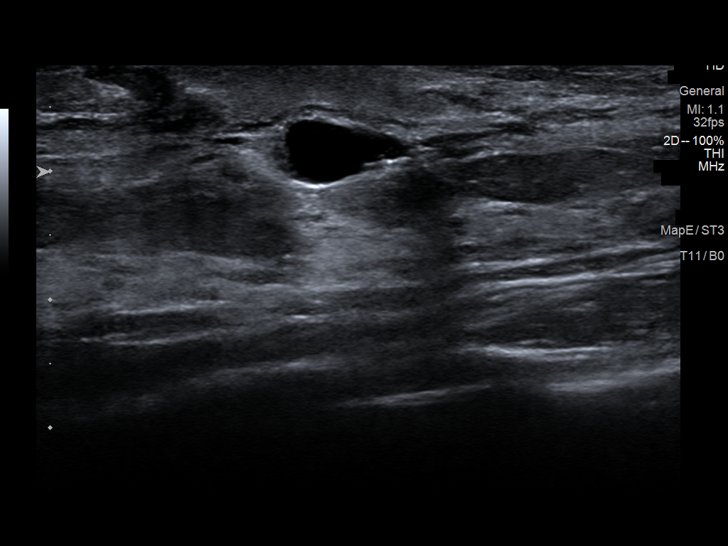
[im 8/11]
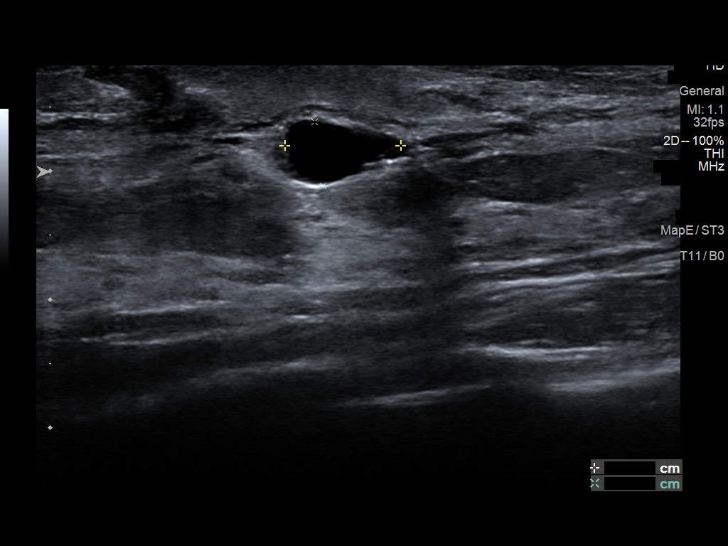
[im 9/11]
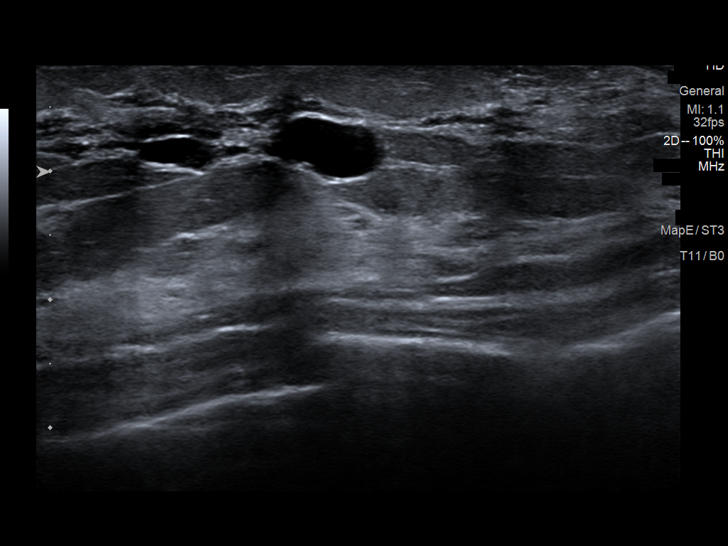
[im 10/11]
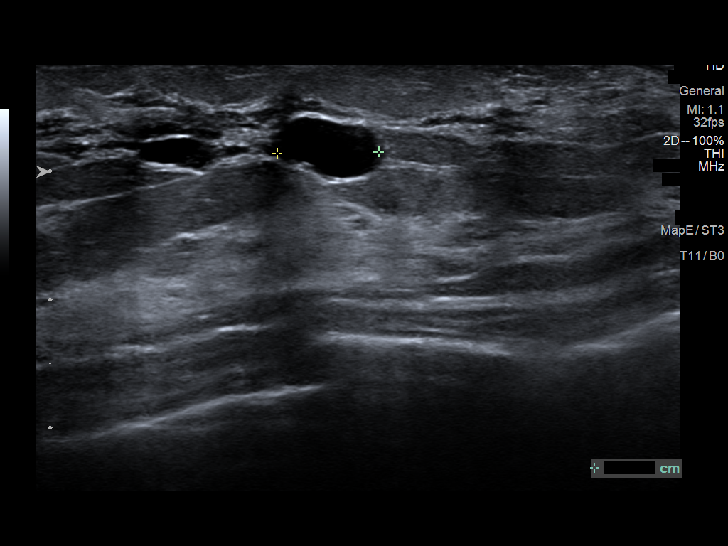
[im 11/11]
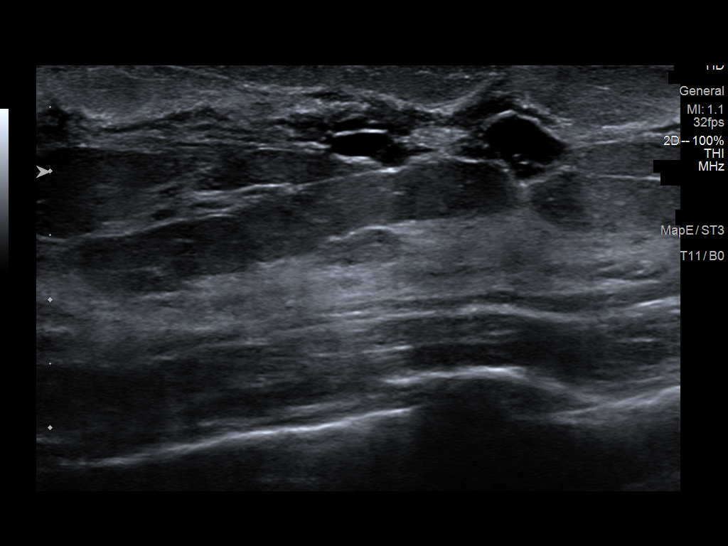

[11 of 11 positions shown; findings below may reference images not displayed]

ACR Breast Density Category c: The breast tissue is heterogeneously
dense, which may obscure small masses.
FINDINGS: Previously described, possible asymmetry in the far posterior, upper
right breast on the MLO projection only does not persist on today's
additional views. This likely represented overlapping from a skin
fold.

Possible asymmetry in the upper left breast on the MLO projection
also resolves into well dispersed fibroglandular tissue on today's
additional views. Given the patient's breast density, precautionary
ultrasound was performed.

Targeted ultrasound is performed, showing numerous simple and
minimally complicated cysts scattered throughout the bilateral
breasts. The largest on the right side is located at the 12 o'clock
position 1 cm from the nipple measuring 1.2 x 0.8 x 0.6 cm. The
largest on the left side is noted at the 3 o'clock position 2 cm
from the nipple. It measures 9 x 8 x 5 mm. No additional suspicious
findings along the lateral left breast at the site of the patient's
focal tenderness.
IMPRESSION: 1. No mammographic evidence of malignancy in either breast.
2. Benign bilateral fibrocystic changes.

RECOMMENDATION:
Screening mammogram in one year.(Code:[BZ])

I have discussed the findings and recommendations with the patient.
If applicable, a reminder letter will be sent to the patient
regarding the next appointment.

BI-RADS CATEGORY  2: Benign.

## 2020-06-22 IMAGING — MG DIGITAL DIAGNOSTIC BILAT W/ TOMO W/ CAD
8 series · 8 of 24 positions shown · non-contrast
Comparison: None.

CLINICAL DATA: 59-year-old female recalled from screening mammogram
dated [DATE] for possible bilateral asymmetries. The patient
also has focal tenderness along the outer left breast.

EXAM:
DIGITAL DIAGNOSTIC BILATERAL MAMMOGRAM WITH CAD AND TOMOSYNTHESIS
ULTRASOUND BILATERAL BREAST
TECHNIQUE: Bilateral digital diagnostic mammography and breast tomosynthesis
was performed. Digital images of the breasts were evaluated with
computer-aided detection. Targeted ultrasound examination of the
Bilateral breast was performed.

[L MLO synth-2D]
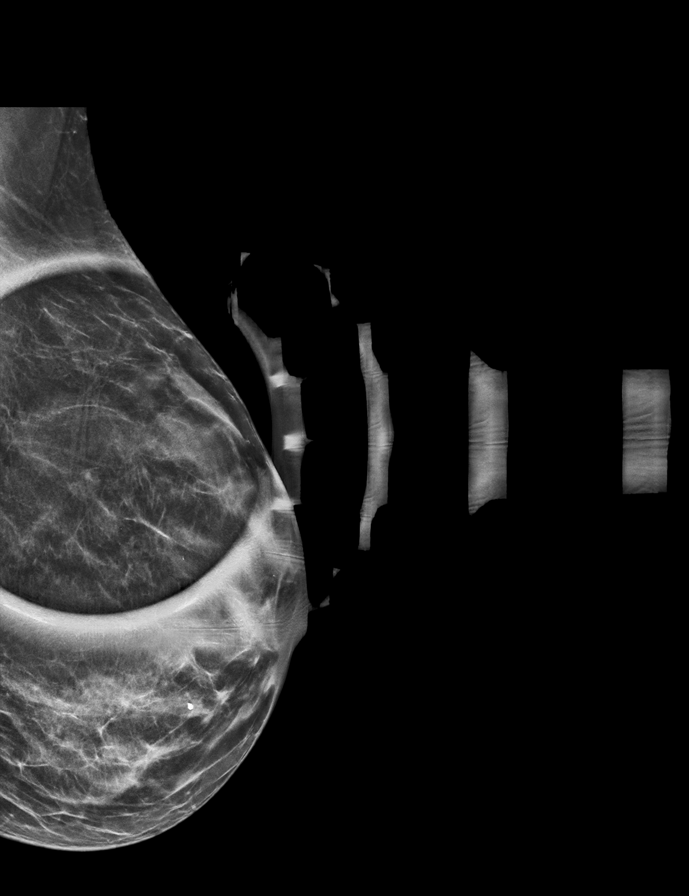

[R MLO synth-2D]
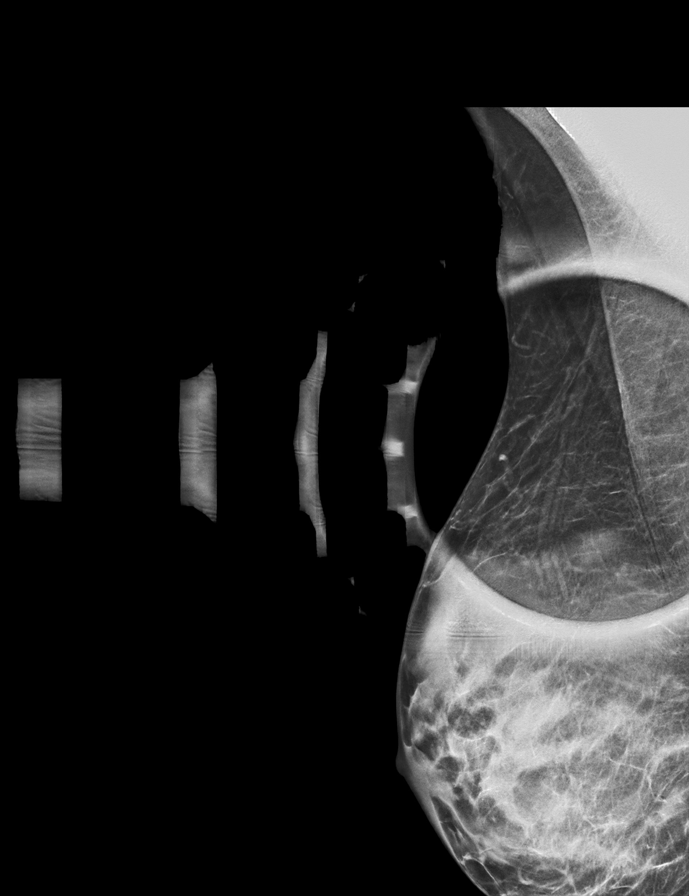

[R ML synth-2D]
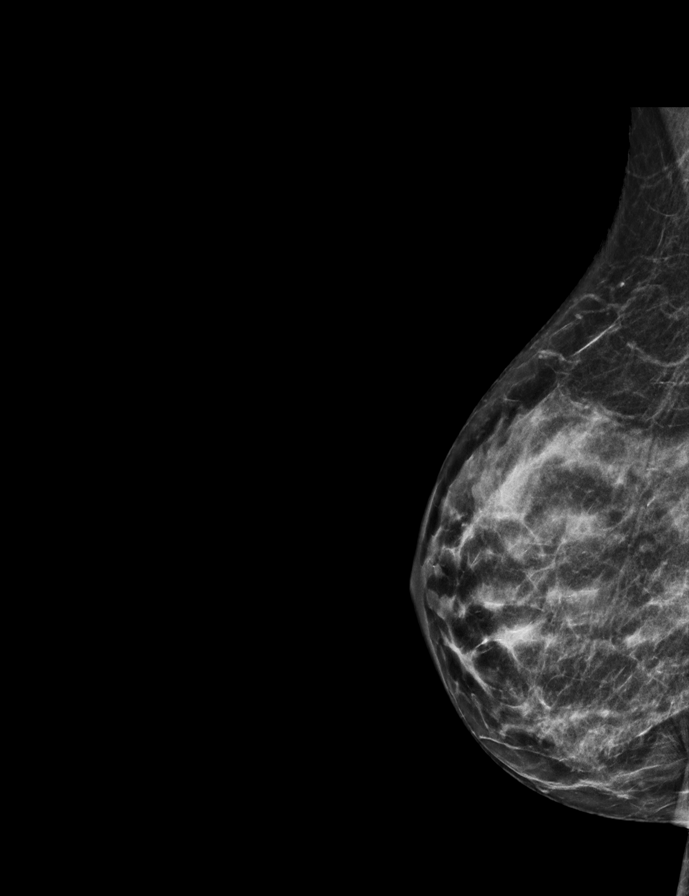

[L ML synth-2D]
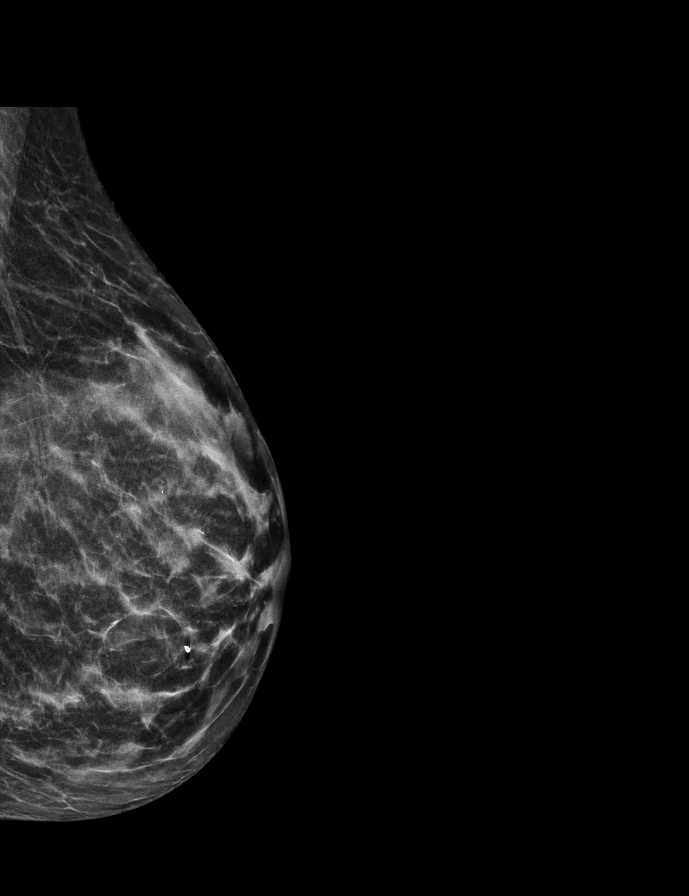

[R MLO tomo · tomo slice 24/47.0]
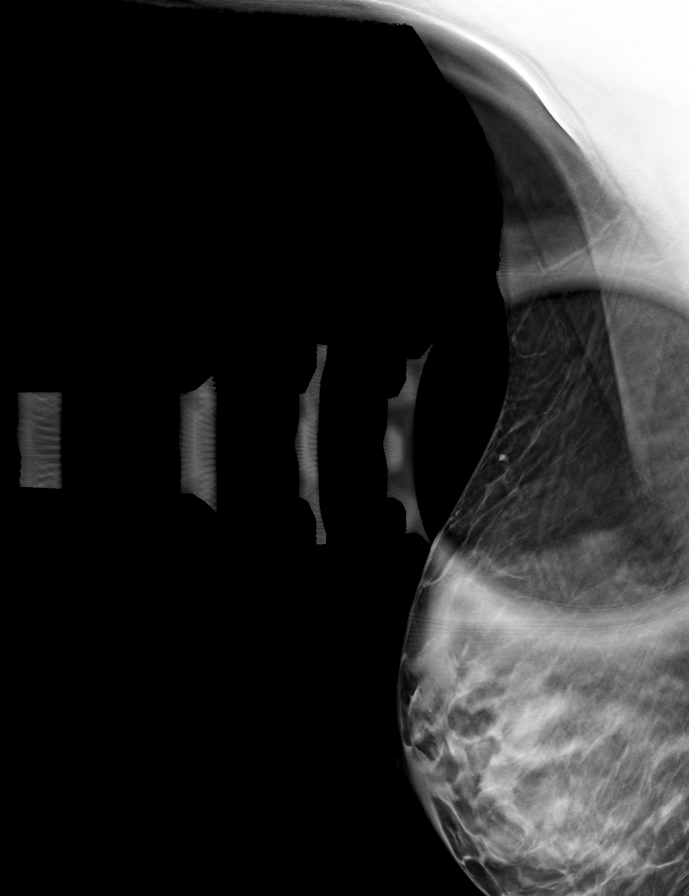

[L ML tomo · tomo slice 25/48.0]
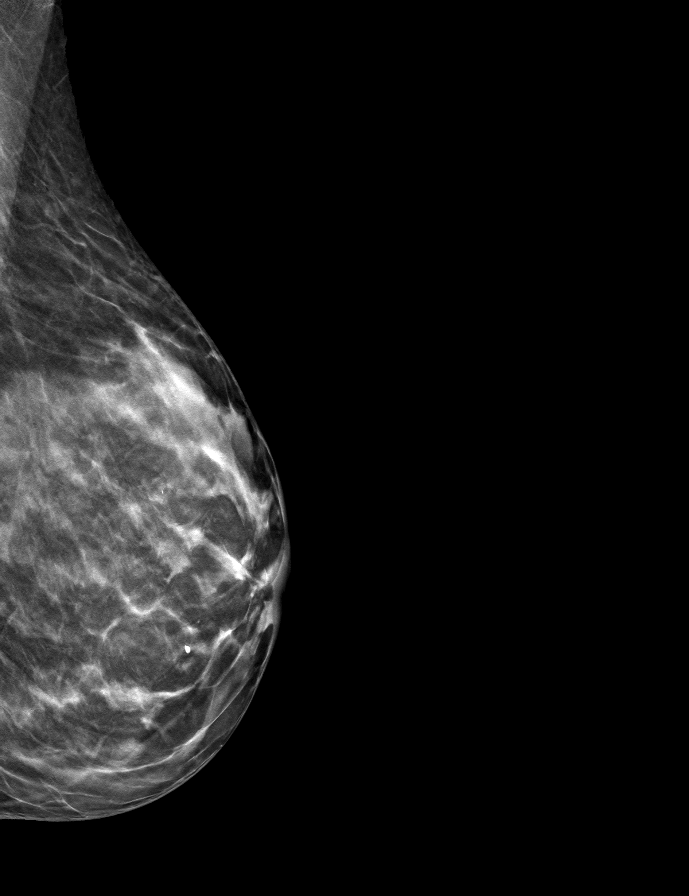

[L MLO tomo · tomo slice 23/45.0]
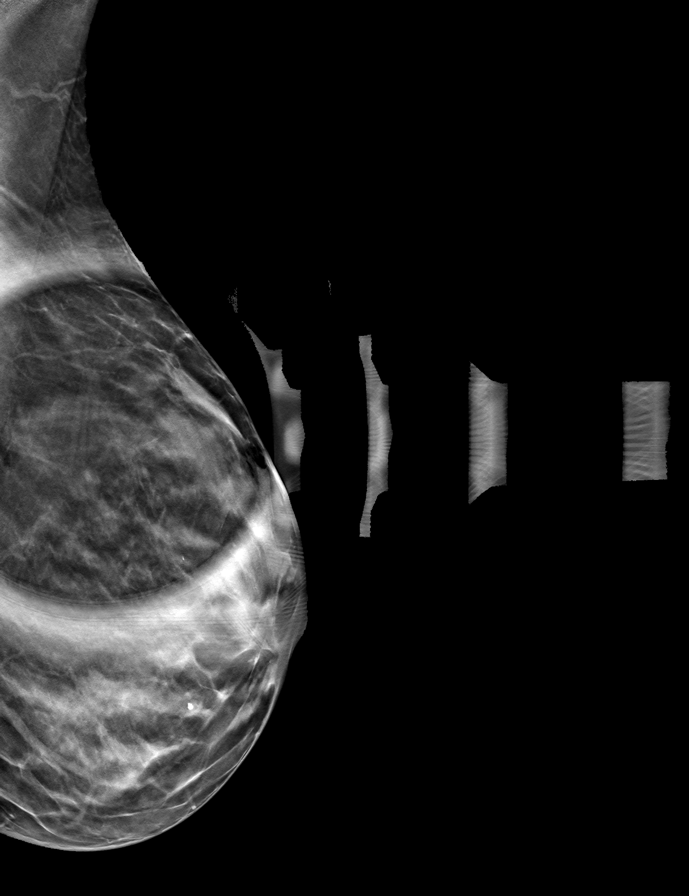

[R ML tomo · tomo slice 27/52.0]
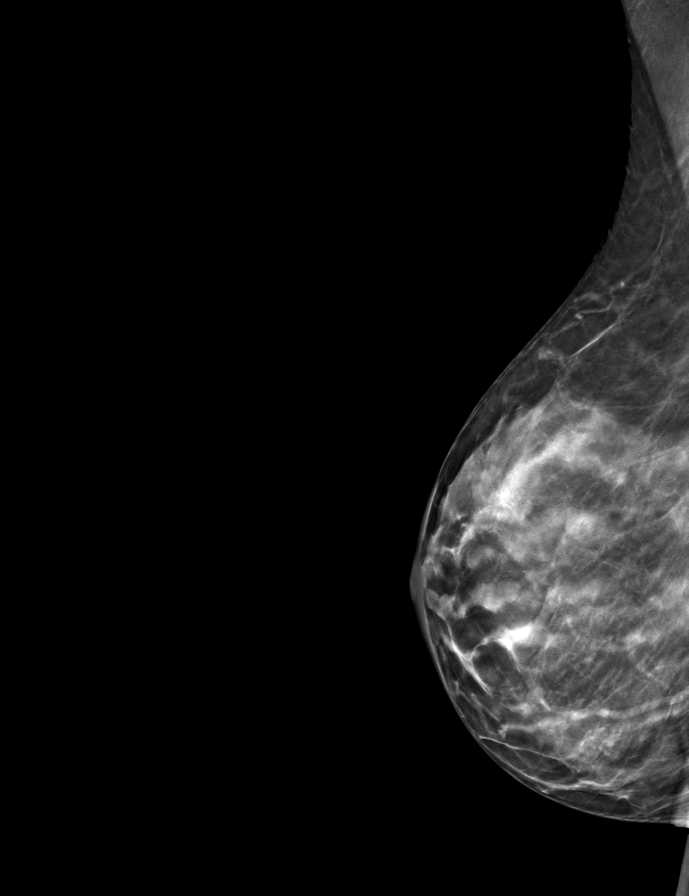

[8 of 24 positions shown; findings below may reference images not displayed]

ACR Breast Density Category c: The breast tissue is heterogeneously
dense, which may obscure small masses.
FINDINGS: Previously described, possible asymmetry in the far posterior, upper
right breast on the MLO projection only does not persist on today's
additional views. This likely represented overlapping from a skin
fold.

Possible asymmetry in the upper left breast on the MLO projection
also resolves into well dispersed fibroglandular tissue on today's
additional views. Given the patient's breast density, precautionary
ultrasound was performed.

Targeted ultrasound is performed, showing numerous simple and
minimally complicated cysts scattered throughout the bilateral
breasts. The largest on the right side is located at the 12 o'clock
position 1 cm from the nipple measuring 1.2 x 0.8 x 0.6 cm. The
largest on the left side is noted at the 3 o'clock position 2 cm
from the nipple. It measures 9 x 8 x 5 mm. No additional suspicious
findings along the lateral left breast at the site of the patient's
focal tenderness.
IMPRESSION: 1. No mammographic evidence of malignancy in either breast.
2. Benign bilateral fibrocystic changes.

RECOMMENDATION:
Screening mammogram in one year.(Code:[BZ])

I have discussed the findings and recommendations with the patient.
If applicable, a reminder letter will be sent to the patient
regarding the next appointment.

BI-RADS CATEGORY  2: Benign.

## 2020-06-29 ENCOUNTER — Other Ambulatory Visit: Payer: BLUE CROSS/BLUE SHIELD

## 2020-07-07 ENCOUNTER — Other Ambulatory Visit: Payer: Self-pay

## 2020-07-08 ENCOUNTER — Ambulatory Visit
Admission: RE | Admit: 2020-07-08 | Discharge: 2020-07-08 | Disposition: A | Discharge status: Home / Self Care | Payer: Self-pay | Source: Ambulatory Visit | Attending: Obstetrics | Admitting: Obstetrics

## 2020-07-08 ENCOUNTER — Other Ambulatory Visit: Payer: Self-pay

## 2020-07-08 DIAGNOSIS — R922 Inconclusive mammogram: Secondary | ICD-10-CM

## 2020-07-08 DIAGNOSIS — N6311 Unspecified lump in the right breast, upper outer quadrant: Secondary | ICD-10-CM | POA: Diagnosis not present

## 2020-07-08 IMAGING — MR MR BREAST BILAT WO/W CM
8 of 12 series · 31 of 48 positions shown · IV contrast (gadavist)
Comparison: No previous breast MRI. Comparison is made to recent
diagnostic mammogram and ultrasound exams of [DATE]

CLINICAL DATA: Family history of breast cancer. Recent screening
mammogram described possible asymmetries within each breast which
were resolved on subsequent diagnostic mammogram and ultrasound of
[DATE]. Bilateral fibrocystic changes were described on same
recent ultrasound report of [DATE].

LABS:  Not performed at [REDACTED].
EXAM:
BILATERAL BREAST MRI WITH AND WITHOUT CONTRAST
TECHNIQUE: Multiplanar, multisequence MR images of both breasts were obtained
prior to and following the intravenous administration of 5 ml of
Gadavist

[Series 3: t2_tirm_tra ipat (a-p) · axial · 3.0mm · 0.70mm/px · 1 of 55 slices shown]
[im 1/55]
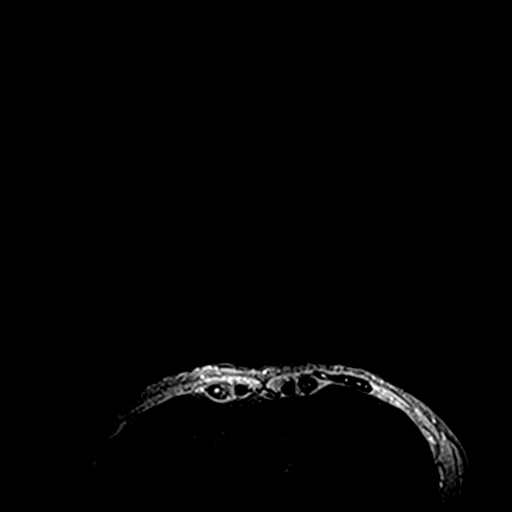

[Series 4: fl3d pre-cm no · axial · non-contrast · 1.2mm · 0.94mm/px · z∈[-68,+103]mm · 5 of 144 slices shown]
[im 1/144]
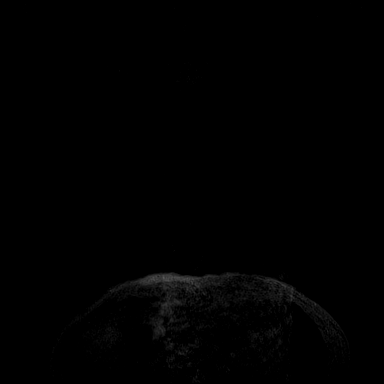
[im 36/144]
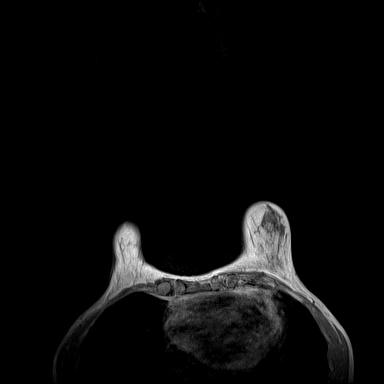
[im 72/144]
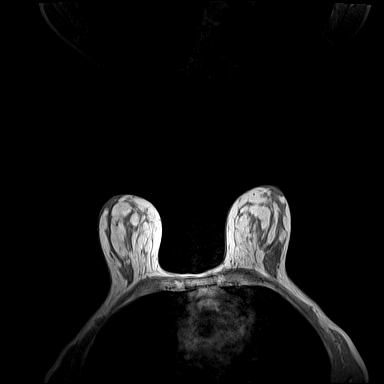
[im 108/144]
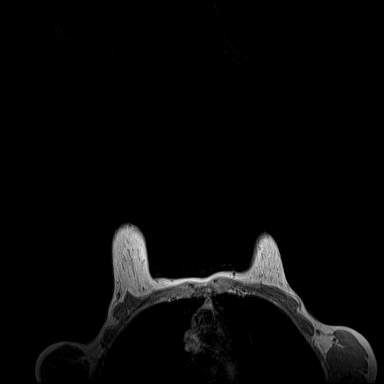
[im 144/144]
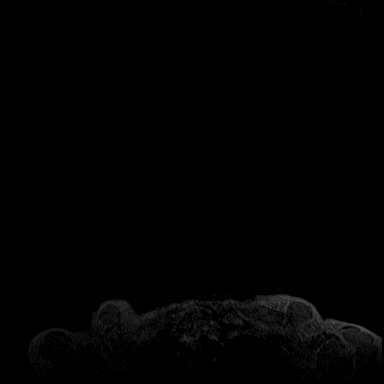

[Series 5: fl3d pre-cm · axial · non-contrast · 1.2mm · 0.94mm/px · z∈[-68,+103]mm · 5 of 144 slices shown]
[im 1/144]
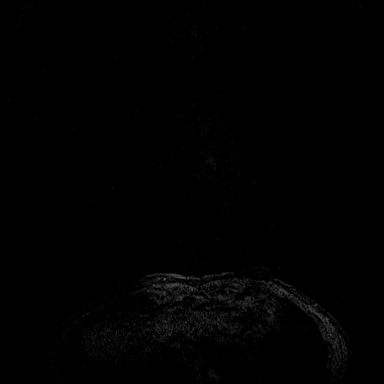
[im 36/144]
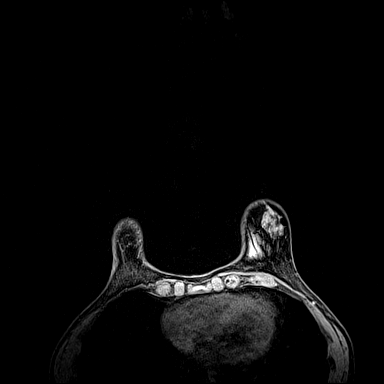
[im 72/144]
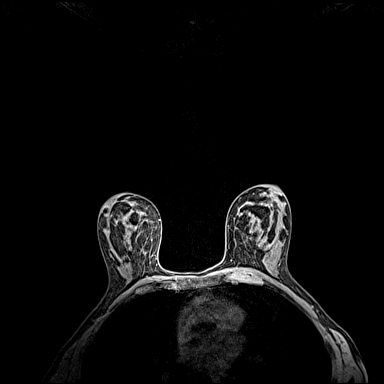
[im 108/144]
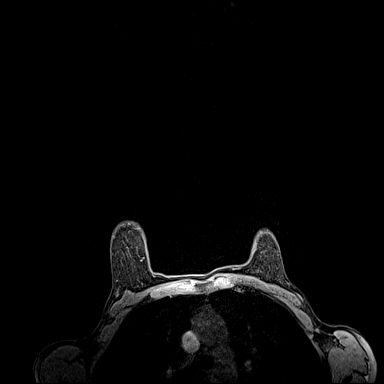
[im 144/144]
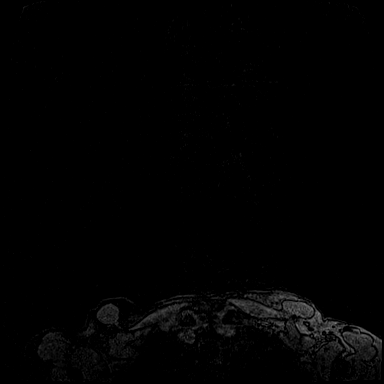

[Series 6: fl3d post immediate · axial · 1.2mm · 0.94mm/px · z∈[-68,+103]mm · 5 of 144 slices shown (1 of 3)]
[im 1/144]
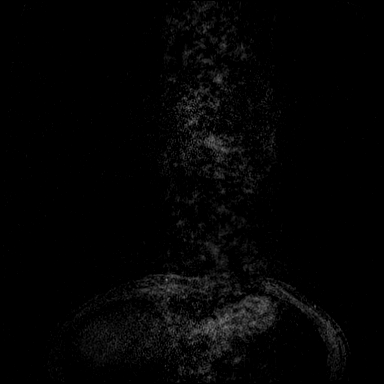
[im 36/144]
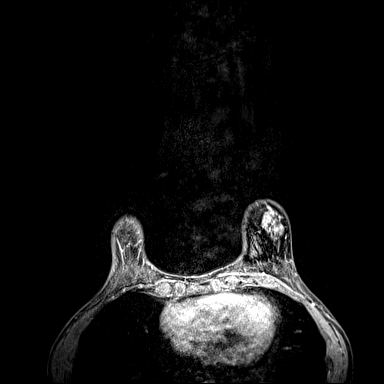
[im 72/144]
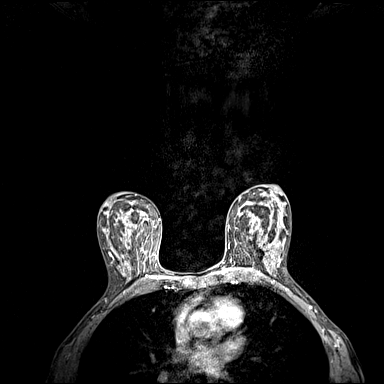
[im 108/144]
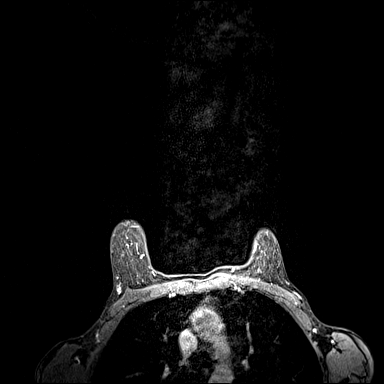
[im 144/144]
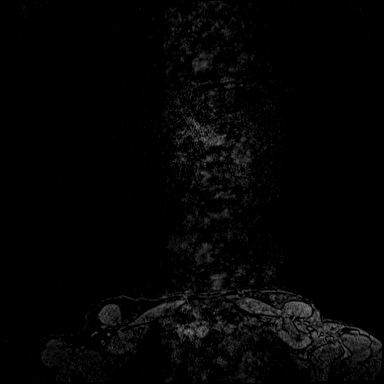

[Series 7: fl3d post immediate · axial · 1.2mm · 0.94mm/px · z∈[-68,+103]mm · 5 of 144 slices shown (2 of 3)]
[im 1/144]
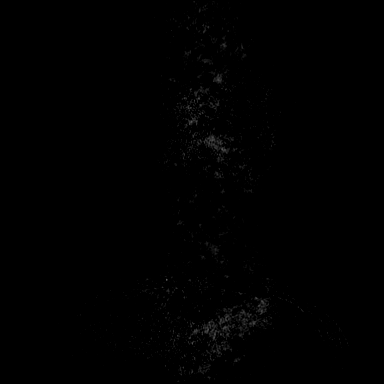
[im 36/144]
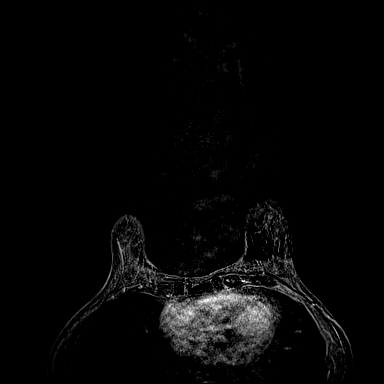
[im 72/144]
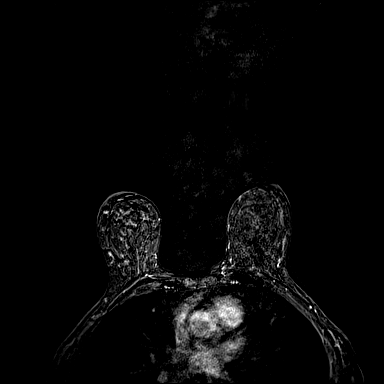
[im 108/144]
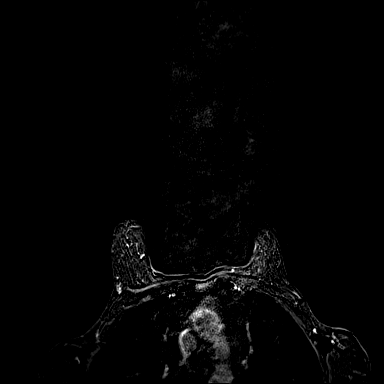
[im 144/144]
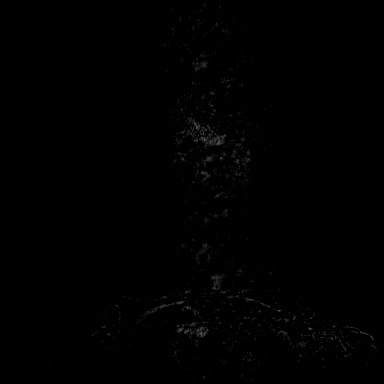

[Series 8: fl3d post immediate · axial · 172.8mm · 0.94mm/px · 1 of 1 slices shown (3 of 3)]
[im 1/1]
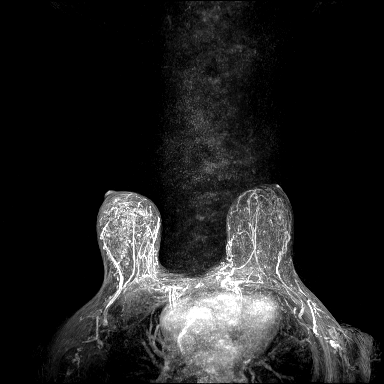

[Series 9: fl3d post 3min · axial · 1.2mm · 0.94mm/px · z∈[-68,+103]mm · 6 of 144 slices shown]
[im 1/144]
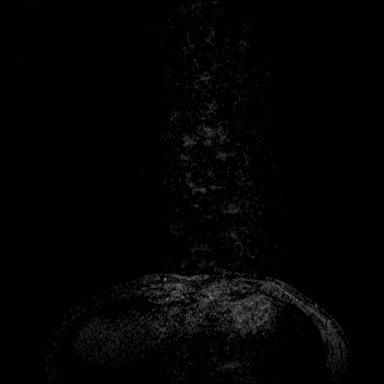
[im 29/144]
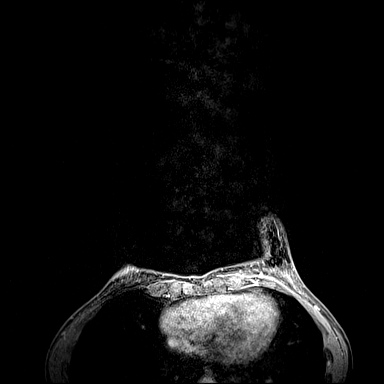
[im 58/144]
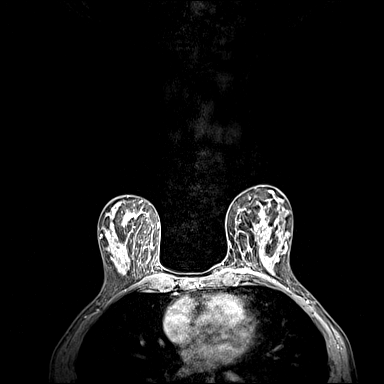
[im 86/144]
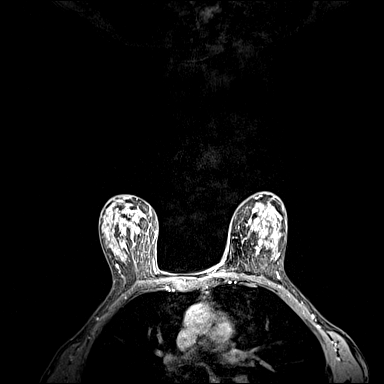
[im 115/144]
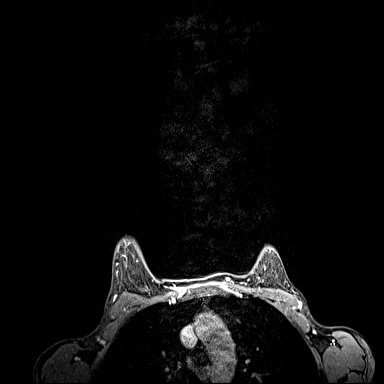
[im 144/144]
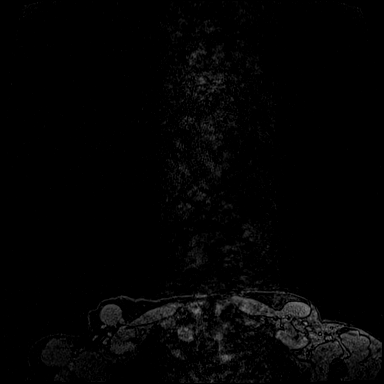

[Series 10: fl3d post 3min_sub · axial · 1.2mm · 0.94mm/px · z∈[-68,+0]mm · 3 of 144 slices shown]
[im 1/144]
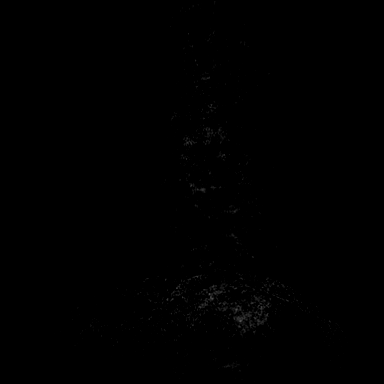
[im 29/144]
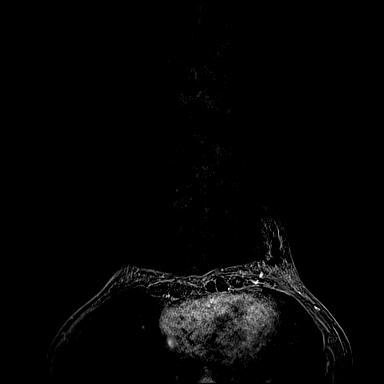
[im 58/144]
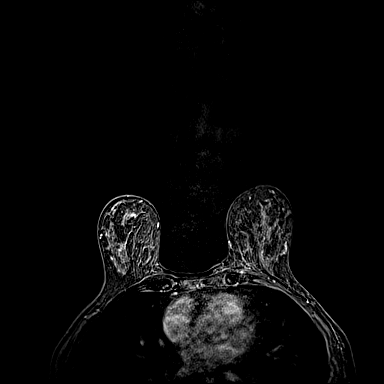

[31 of 48 positions shown; findings below may reference images not displayed]

Three-dimensional MR images were rendered by post-processing of the
original MR data on an independent workstation. The
three-dimensional MR images were interpreted, and findings are
reported in the following complete MRI report for this study. Three
dimensional images were evaluated at the independent interpreting
workstation using the DynaCAD thin client.
FINDINGS: Breast composition: c. Heterogeneous fibroglandular tissue.

Background parenchymal enhancement: Moderate to marked.

Right breast: Oval enhancing mass within the upper-outer quadrant of
the RIGHT breast, measuring 6 mm greatest dimension, most likely
corresponding to a benign intramammary lymph node seen on multiple
prior mammograms in the outer RIGHT breast.

No additional enhancing mass, suspicious non-mass enhancement or
secondary signs of malignancy within the RIGHT breast. Multiple
benign cysts within the RIGHT breast.

Left breast: No suspicious enhancing mass, non-mass enhancement or
secondary signs of malignancy within the LEFT breast. Multiple
benign cysts within the LEFT breast.

Lymph nodes: No abnormal appearing lymph nodes.

Ancillary findings:  None.
IMPRESSION: 1. Oval enhancing mass within the upper-outer quadrant of the RIGHT
breast, measuring 6 mm, most likely corresponding to a benign
intramammary lymph node seen on multiple prior mammograms in the
outer RIGHT breast. Recommend targeted second-look ultrasound of the
RIGHT breast for further characterization.
2. No evidence of malignancy within the LEFT breast.
3. Multiple benign cysts within each breast.

RECOMMENDATION:
1. Targeted second-look ultrasound of the upper-outer quadrant of
the RIGHT breast for further characterization of the probably benign
6 mm mass seen on today's MRI which likely corresponds to a benign
intramammary lymph node seen on multiple prior mammograms in the
outer RIGHT breast.
2. If a convincing sonographic correlate is not identified,
recommend six-month follow-up breast MRI to ensure stability of
today's probably benign MRI findings.

BI-RADS CATEGORY  3: Probably benign.

## 2020-07-08 MED ORDER — GADOBUTROL 1 MMOL/ML IV SOLN
5.0000 mL | Freq: Once | INTRAVENOUS | Status: AC | PRN
  Administered 2020-07-08: 5 mL via INTRAVENOUS

## 2020-07-12 ENCOUNTER — Other Ambulatory Visit: Payer: Self-pay | Admitting: Obstetrics

## 2020-07-12 DIAGNOSIS — N63 Unspecified lump in unspecified breast: Secondary | ICD-10-CM

## 2020-07-13 DIAGNOSIS — Z20822 Contact with and (suspected) exposure to covid-19: Secondary | ICD-10-CM | POA: Diagnosis not present

## 2020-07-13 DIAGNOSIS — Z03818 Encounter for observation for suspected exposure to other biological agents ruled out: Secondary | ICD-10-CM | POA: Diagnosis not present

## 2020-07-16 DIAGNOSIS — Z03818 Encounter for observation for suspected exposure to other biological agents ruled out: Secondary | ICD-10-CM | POA: Diagnosis not present

## 2020-07-16 DIAGNOSIS — R509 Fever, unspecified: Secondary | ICD-10-CM | POA: Diagnosis not present

## 2020-07-16 DIAGNOSIS — R059 Cough, unspecified: Secondary | ICD-10-CM | POA: Diagnosis not present

## 2020-07-16 DIAGNOSIS — J029 Acute pharyngitis, unspecified: Secondary | ICD-10-CM | POA: Diagnosis not present

## 2020-07-17 ENCOUNTER — Other Ambulatory Visit: Payer: Self-pay | Admitting: Family Medicine

## 2020-07-17 ENCOUNTER — Other Ambulatory Visit: Payer: Self-pay

## 2020-07-17 ENCOUNTER — Ambulatory Visit
Admission: RE | Admit: 2020-07-17 | Discharge: 2020-07-17 | Disposition: A | Discharge status: Home / Self Care | Payer: Self-pay | Source: Ambulatory Visit | Attending: Family Medicine | Admitting: Family Medicine

## 2020-07-17 DIAGNOSIS — R059 Cough, unspecified: Secondary | ICD-10-CM

## 2020-07-17 IMAGING — DX DG CHEST 2V
2 series · 2 of 2 positions shown · non-contrast
Comparison: [DATE]

CLINICAL DATA: 59-year-old female with a history of cough

EXAM:
CHEST - 2 VIEW

[dg chest 2 view (1 of 2)]
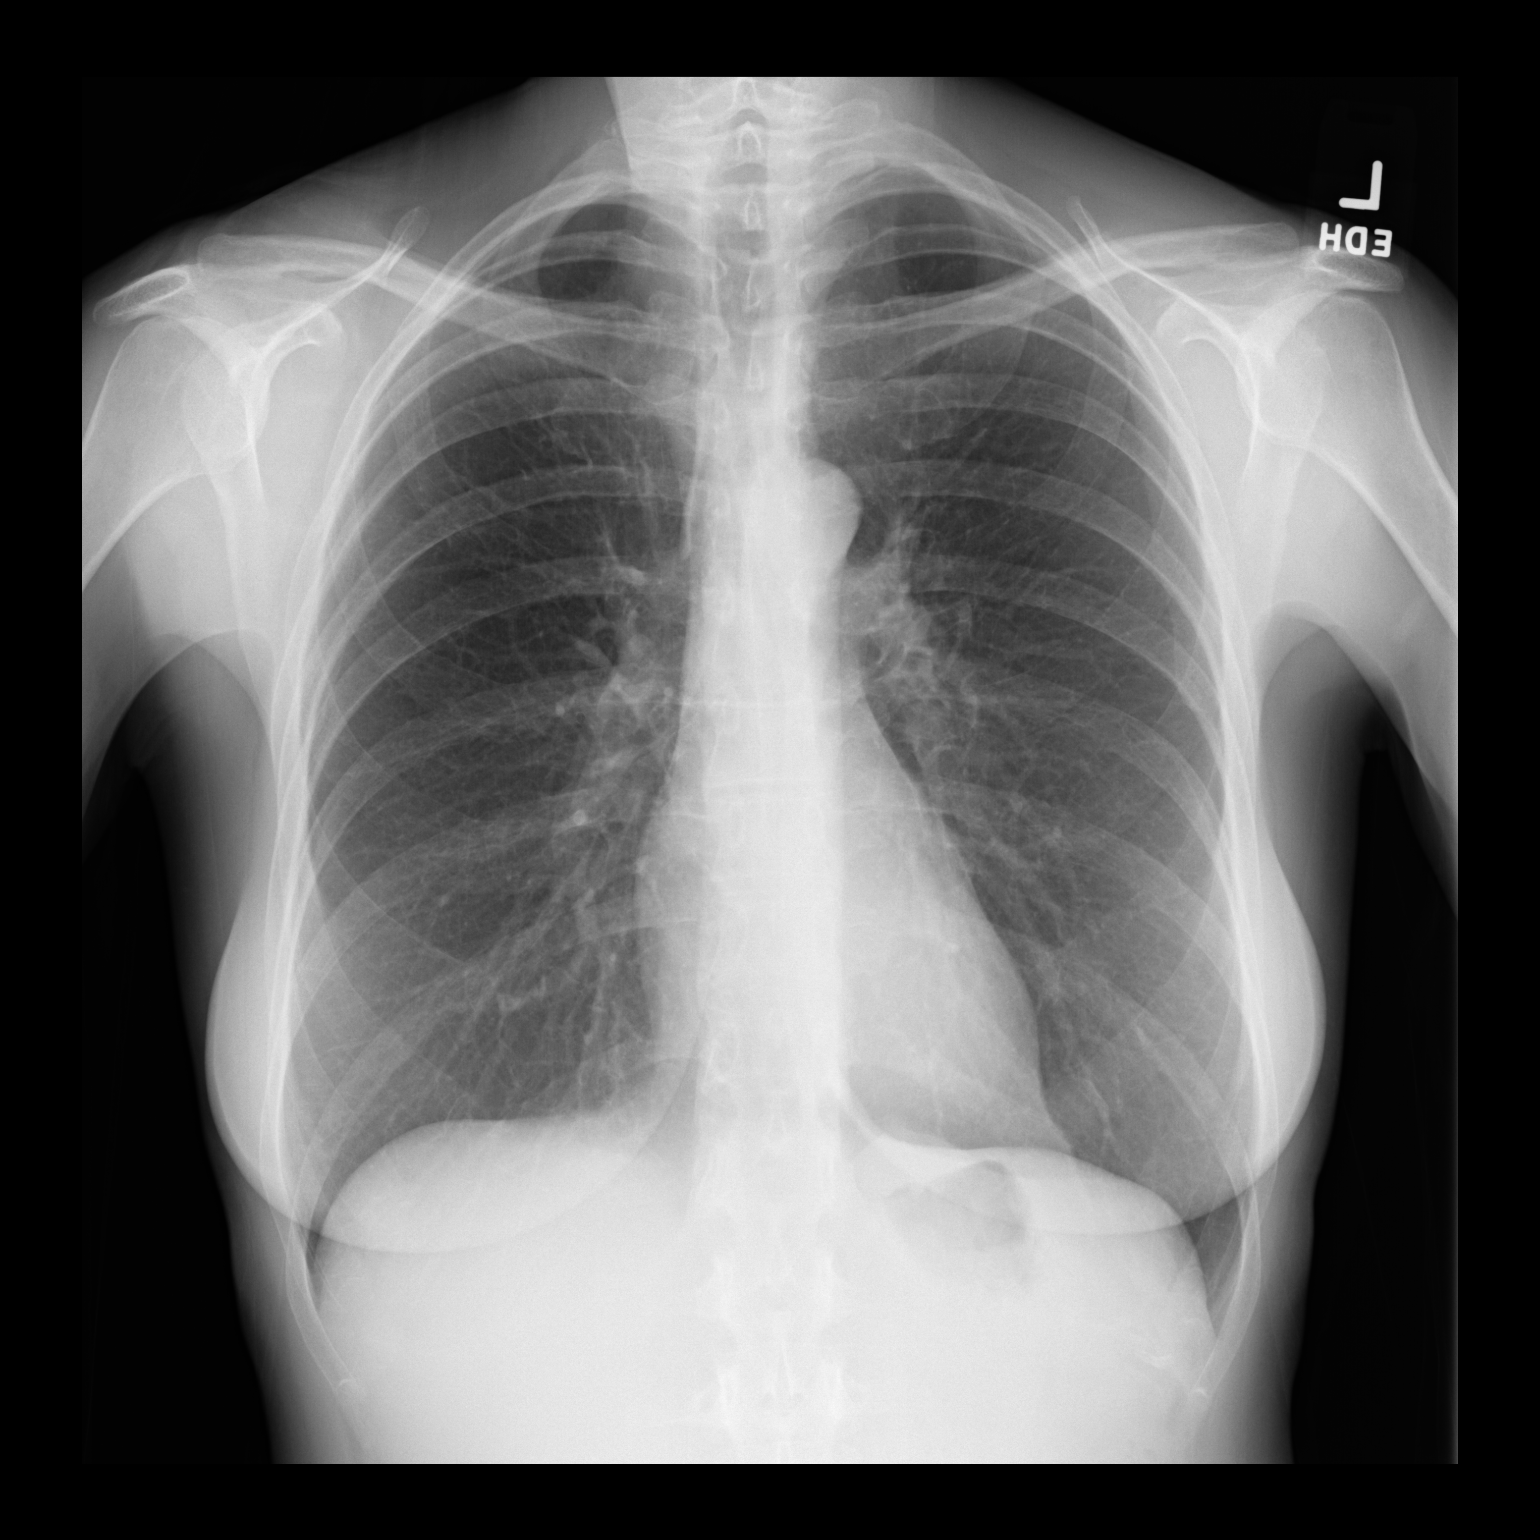

[dg chest 2 view (2 of 2)]
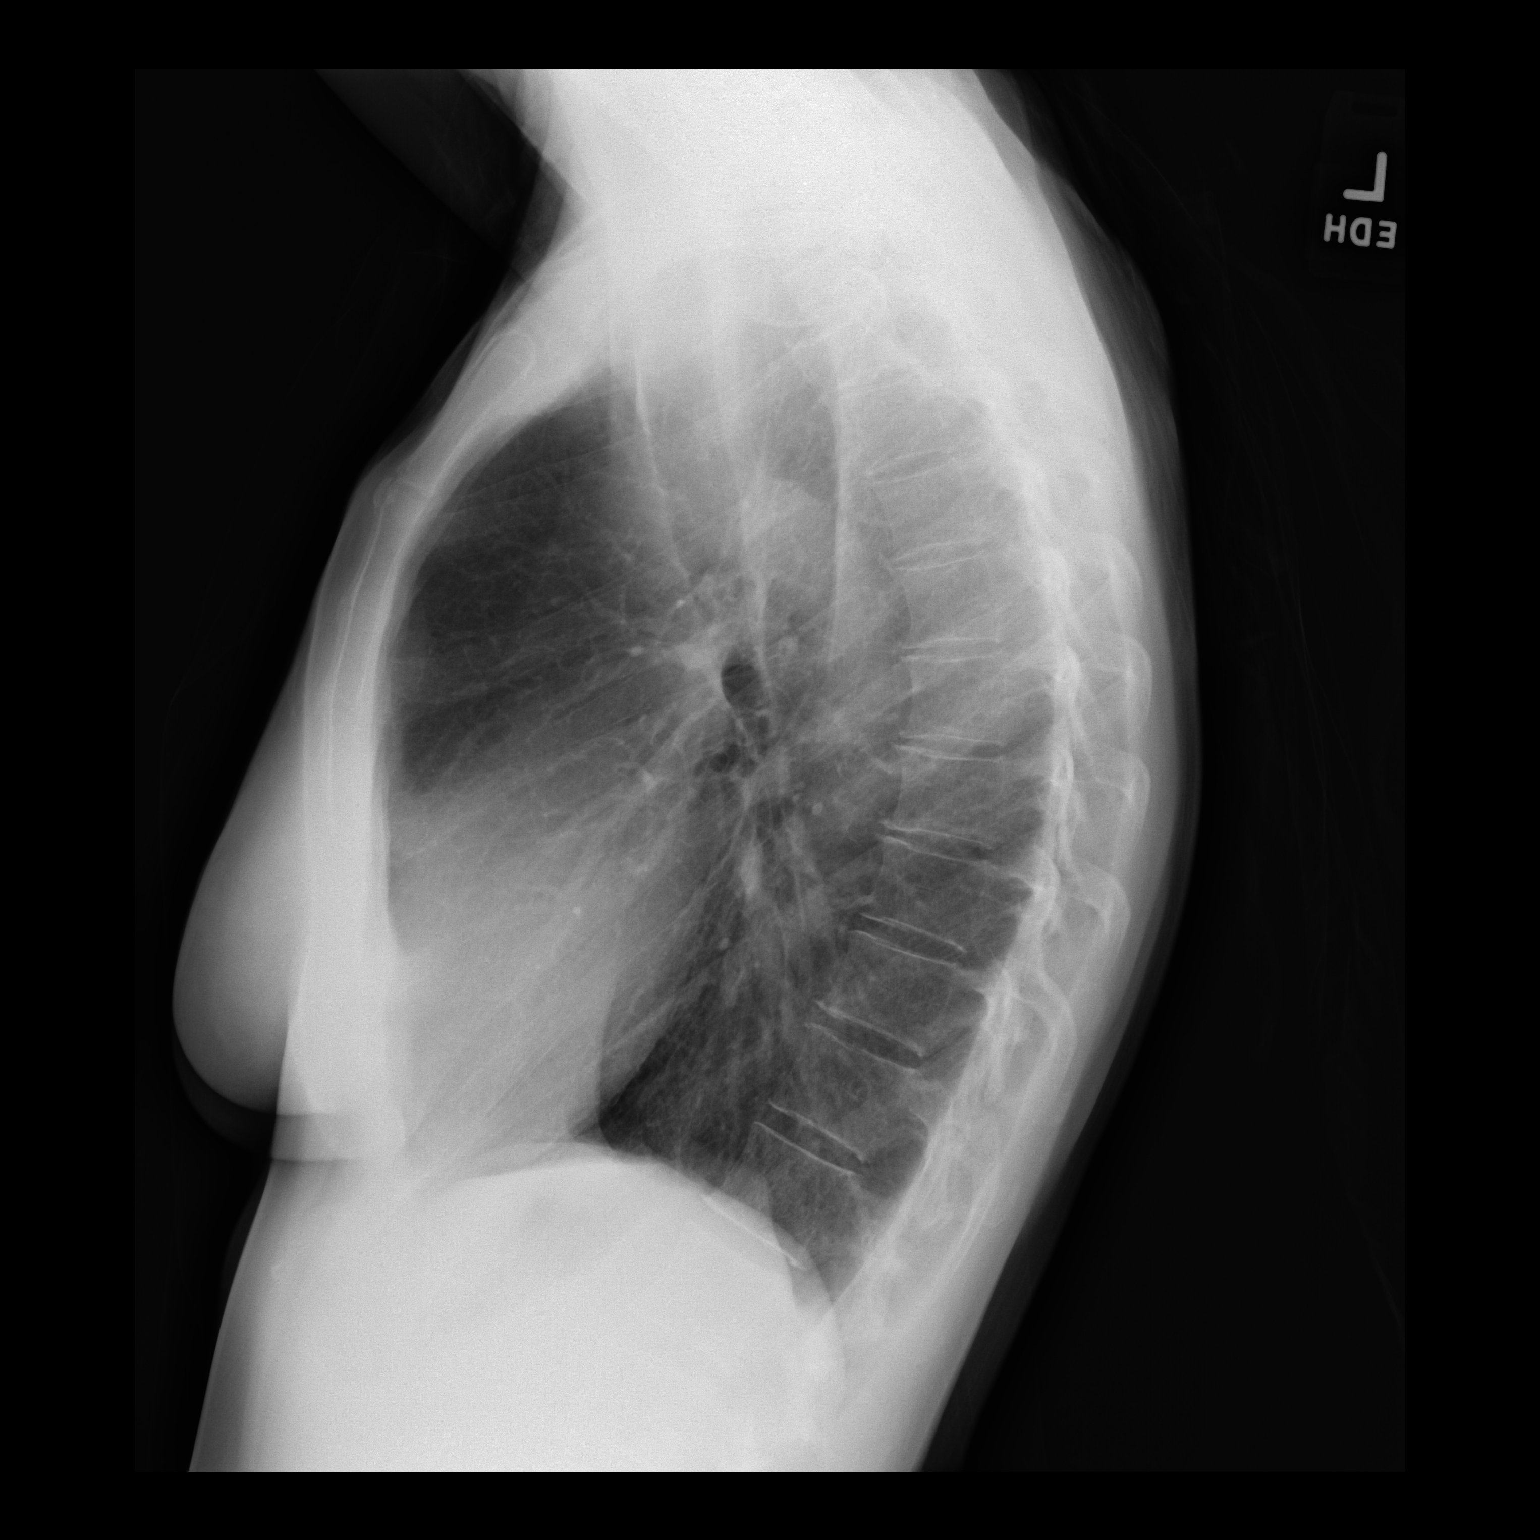

[2 of 2 positions shown; findings below may reference images not displayed]

FINDINGS: Cardiomediastinal silhouette unchanged in size and contour. No
pneumothorax. No pleural effusion. No confluent airspace disease. No
interlobular septal thickening. No acute displaced fracture
IMPRESSION: Negative for acute cardiopulmonary disease

## 2020-07-18 DIAGNOSIS — J45909 Unspecified asthma, uncomplicated: Secondary | ICD-10-CM | POA: Diagnosis not present

## 2020-07-23 DIAGNOSIS — R059 Cough, unspecified: Secondary | ICD-10-CM | POA: Diagnosis not present

## 2020-07-24 ENCOUNTER — Ambulatory Visit
Admission: RE | Admit: 2020-07-24 | Discharge: 2020-07-24 | Disposition: A | Discharge status: Home / Self Care | Payer: BLUE CROSS/BLUE SHIELD | Source: Ambulatory Visit | Attending: Obstetrics | Admitting: Obstetrics

## 2020-07-24 ENCOUNTER — Other Ambulatory Visit: Payer: Self-pay

## 2020-07-24 DIAGNOSIS — N6310 Unspecified lump in the right breast, unspecified quadrant: Secondary | ICD-10-CM | POA: Diagnosis not present

## 2020-07-24 DIAGNOSIS — N63 Unspecified lump in unspecified breast: Secondary | ICD-10-CM

## 2020-07-25 ENCOUNTER — Other Ambulatory Visit: Payer: Self-pay | Admitting: Obstetrics

## 2020-07-25 DIAGNOSIS — R9389 Abnormal findings on diagnostic imaging of other specified body structures: Secondary | ICD-10-CM

## 2020-07-27 ENCOUNTER — Ambulatory Visit
Admission: RE | Admit: 2020-07-27 | Discharge: 2020-07-27 | Disposition: A | Discharge status: Home / Self Care | Payer: BLUE CROSS/BLUE SHIELD | Source: Ambulatory Visit | Attending: Obstetrics | Admitting: Obstetrics

## 2020-07-27 ENCOUNTER — Other Ambulatory Visit (HOSPITAL_COMMUNITY): Payer: Self-pay | Admitting: Diagnostic Radiology

## 2020-07-27 ENCOUNTER — Other Ambulatory Visit: Payer: Self-pay

## 2020-07-27 DIAGNOSIS — N6011 Diffuse cystic mastopathy of right breast: Secondary | ICD-10-CM | POA: Diagnosis not present

## 2020-07-27 DIAGNOSIS — N6311 Unspecified lump in the right breast, upper outer quadrant: Secondary | ICD-10-CM | POA: Diagnosis not present

## 2020-07-27 DIAGNOSIS — R9389 Abnormal findings on diagnostic imaging of other specified body structures: Secondary | ICD-10-CM

## 2020-07-27 HISTORY — PX: BREAST BIOPSY: SHX20

## 2020-07-27 IMAGING — MG MM BREAST LOCALIZATION CLIP
4 series · 4 of 12 positions shown · non-contrast
Comparison: Previous exam(s).

CLINICAL DATA: Post MR guided core needle biopsy of a 6 mm mass in
the upper outer right breast.

EXAM:
DIAGNOSTIC RIGHT MAMMOGRAM POST MRI BIOPSY

[R CC synth-2D]
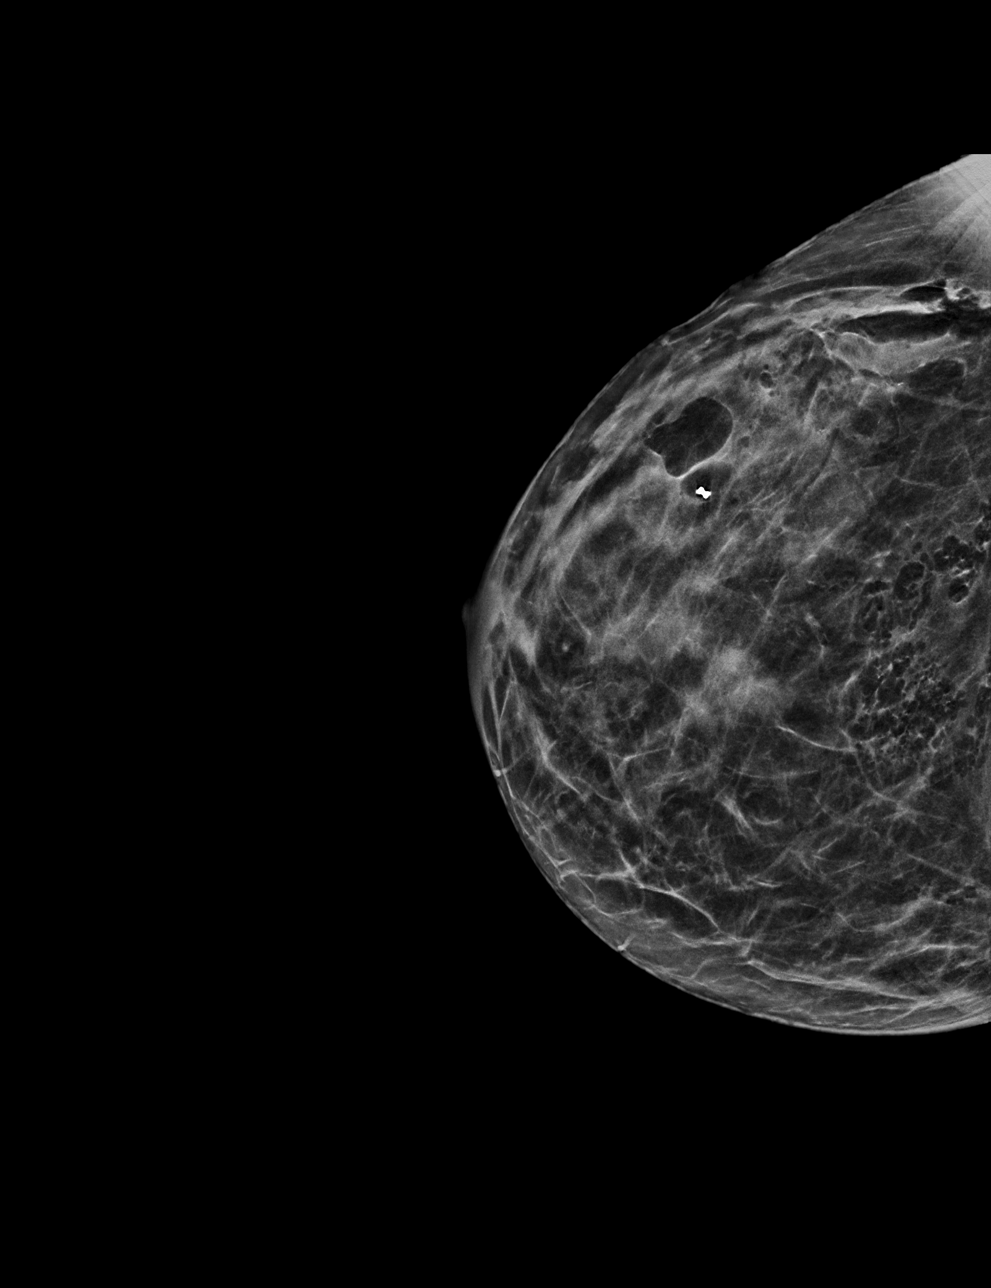

[R ML synth-2D]
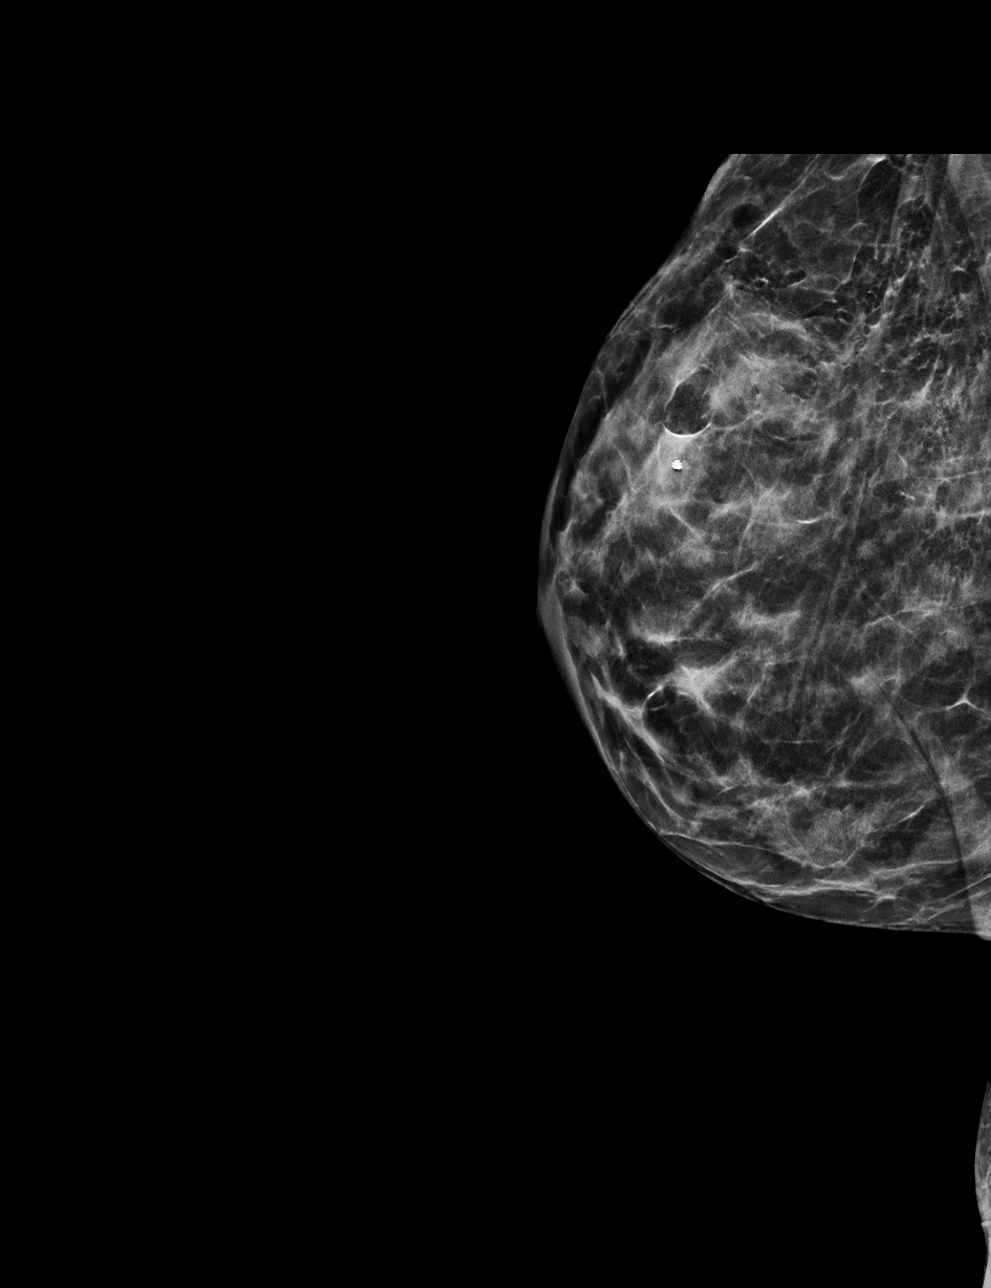

[R CC tomo · tomo slice 23/45.0]
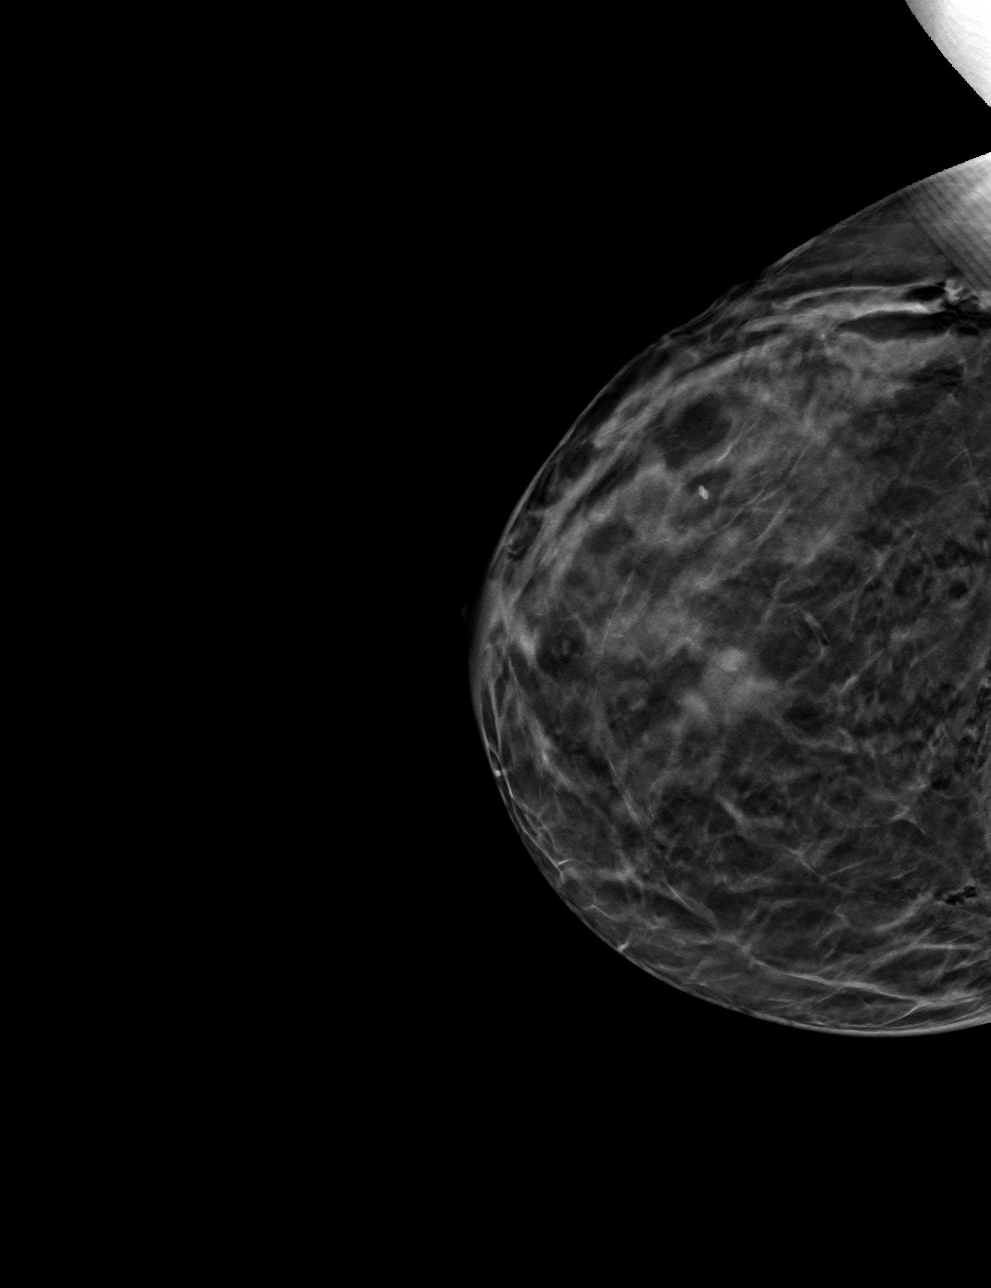

[R ML tomo · tomo slice 23/45.0]
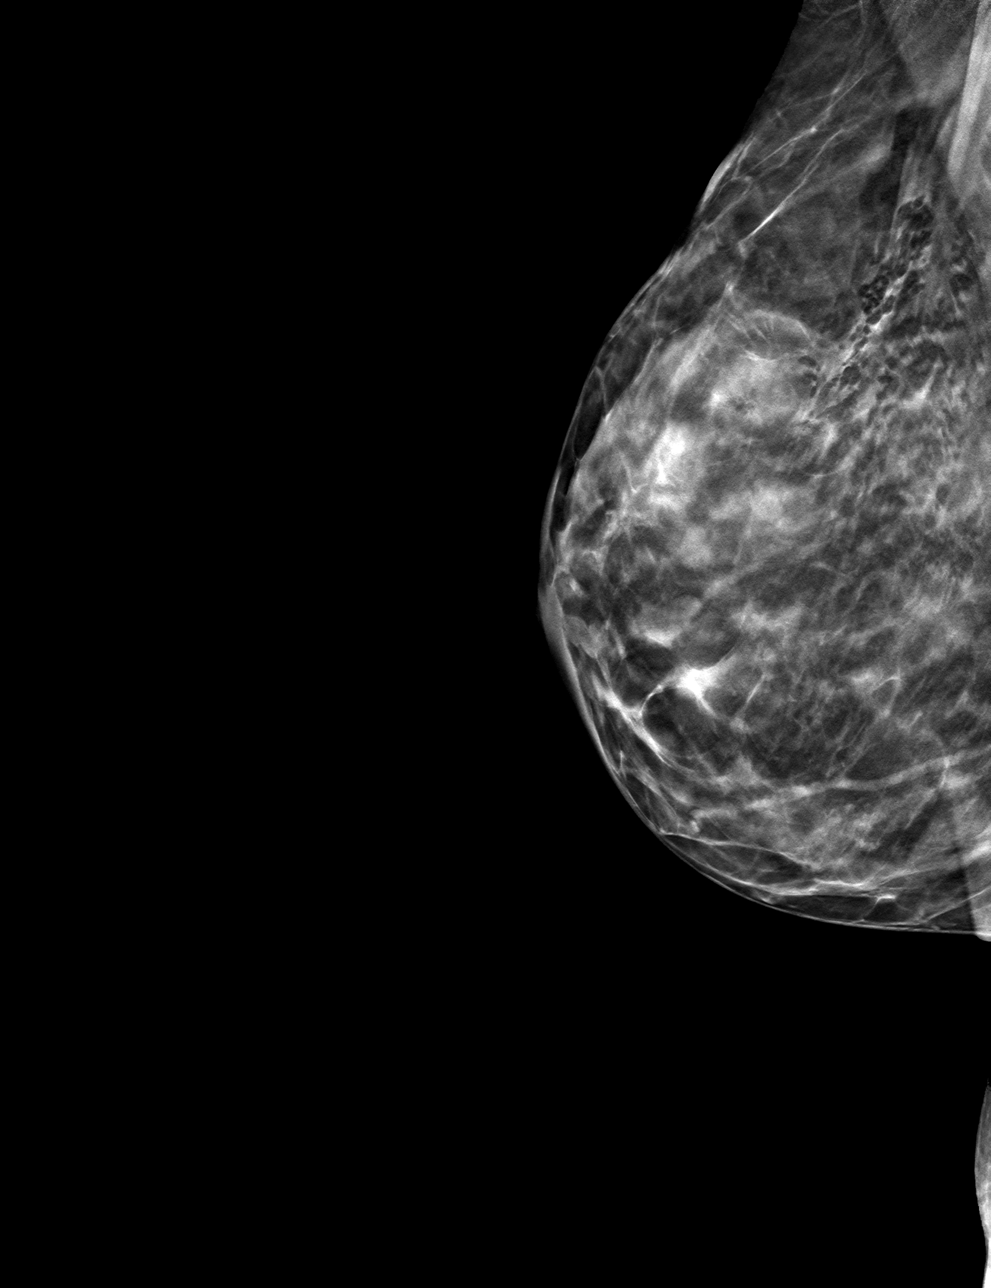

[4 of 12 positions shown; findings below may reference images not displayed]

FINDINGS: Mammographic images were obtained following MR guided biopsy of the
recently demonstrated 6 mm mass in the upper outer right breast. The
biopsy marking clip is in expected position at the site of biopsy.
IMPRESSION: Appropriate positioning of the barbell shaped biopsy marking clip at
the site of biopsy in the upper outer right breast.

Final Assessment: Post Procedure Mammograms for Marker Placement

## 2020-07-27 IMAGING — MR MR BREAST BX W/ LOC DEV 1ST LEASION IMAGE BX SPEC MR GUIDE*R*
9 of 12 series · 33 of 48 positions shown · IV contrast (6 ML GADAVIST)
Comparison: Previous exams.
COMPARISON: Previous exams.

Addendum:
CLINICAL DATA: 6 mm mass in the upper outer right breast on a
recent screening MRI for a family history of breast cancer including
her mother and sister.

EXAM:
MRI GUIDED CORE NEEDLE BIOPSY OF THE RIGHT BREAST
TECHNIQUE: Multiplanar, multisequence MR imaging of the right breast was
performed both before and after administration of intravenous
contrast.
CONTRAST:  6mL GADAVIST GADOBUTROL 1 MMOL/ML IV SOLN

[Series 2: fiducial unilateral · sagittal · 2.0mm · 1.33mm/px · 2 of 50 slices shown]
[im 1/50]
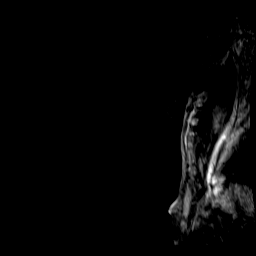
[im 50/50]
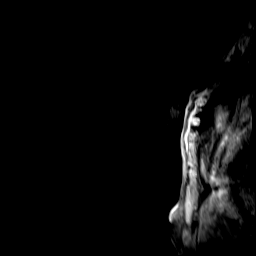

[Series 3: dynamic pre · axial · non-contrast · 1.3mm · 0.73mm/px · z∈[-106,+80]mm · 5 of 144 slices shown]
[im 1/144]
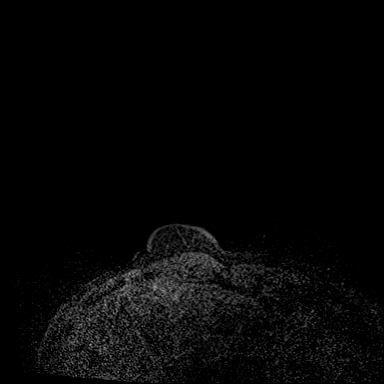
[im 36/144]
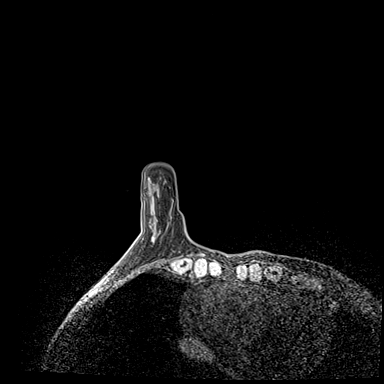
[im 72/144]
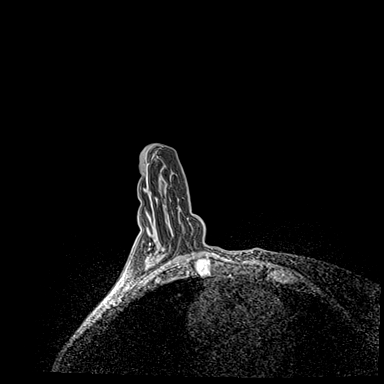
[im 108/144]
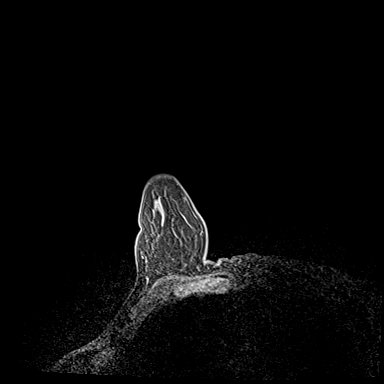
[im 144/144]
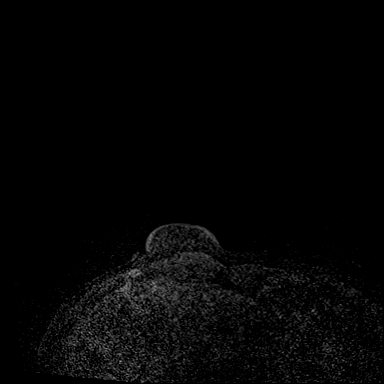

[Series 4: dynamic post 20 · axial · 1.3mm · 0.73mm/px · z∈[-106,+80]mm · 5 of 144 slices shown (1 of 2)]
[im 1/144]
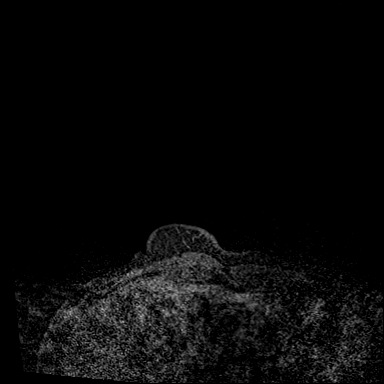
[im 36/144]
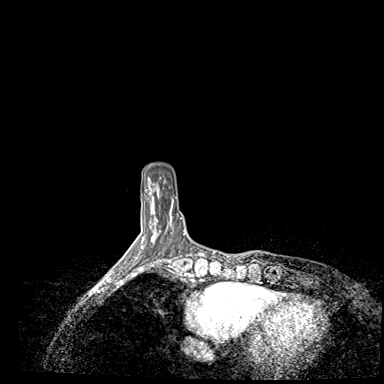
[im 72/144]
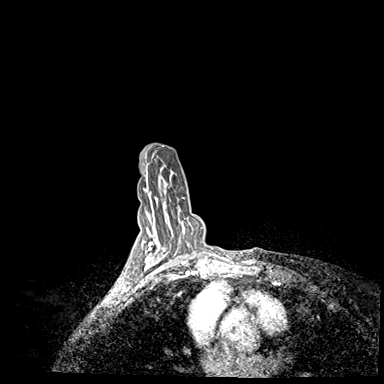
[im 108/144]
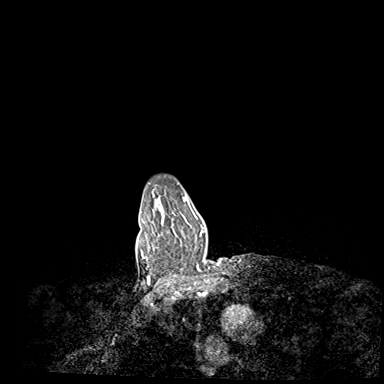
[im 144/144]
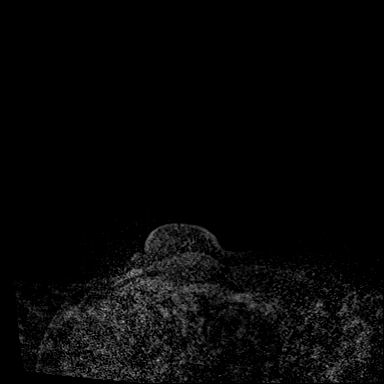

[Series 5: dynamic post 20 · axial · 1.3mm · 0.73mm/px · z∈[-106,+80]mm · 4 of 144 slices shown (2 of 2)]
[im 1/144]
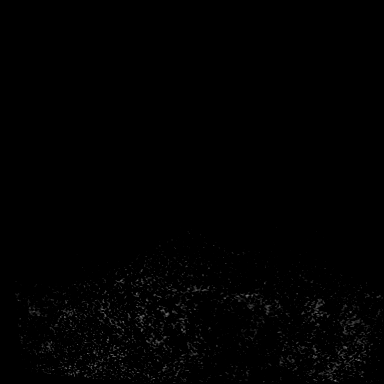
[im 48/144]
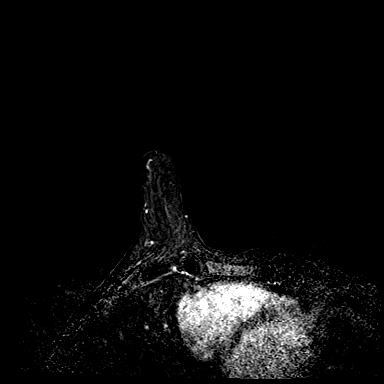
[im 96/144]
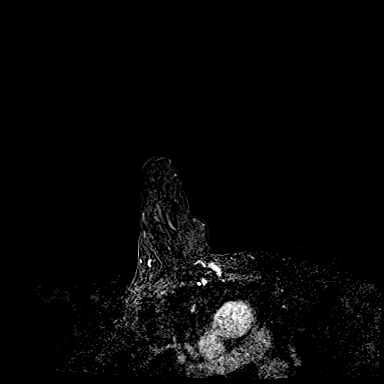
[im 144/144]
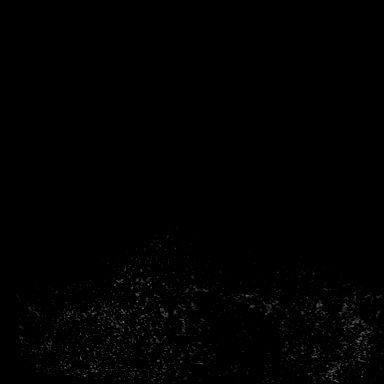

[Series 6: dynamic post 3 · axial · 1.3mm · 0.73mm/px · z∈[-106,+80]mm · 4 of 144 slices shown (1 of 2)]
[im 1/144]
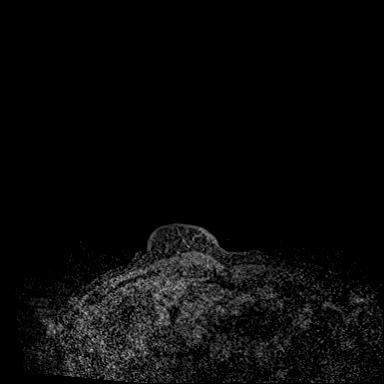
[im 48/144]
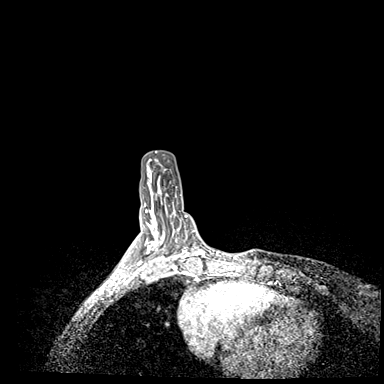
[im 96/144]
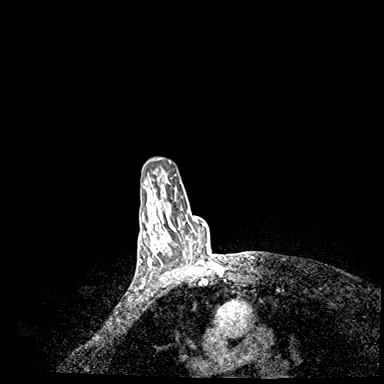
[im 144/144]
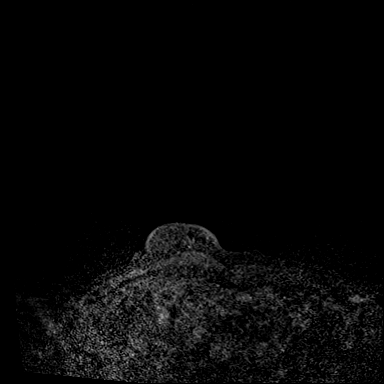

[Series 7: dynamic post 3 · axial · 1.3mm · 0.73mm/px · z∈[-106,+80]mm · 4 of 144 slices shown (2 of 2)]
[im 1/144]
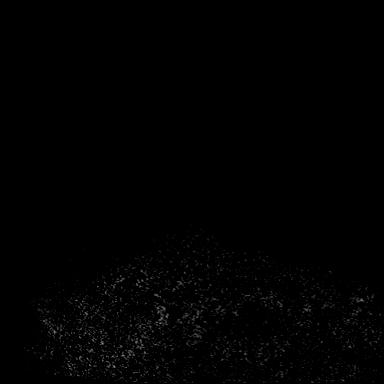
[im 48/144]
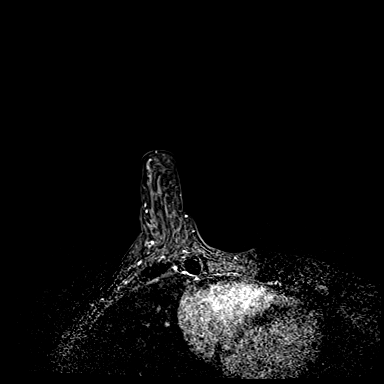
[im 96/144]
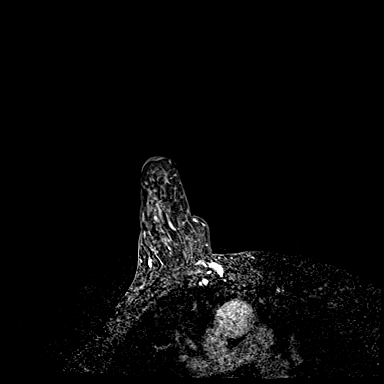
[im 144/144]
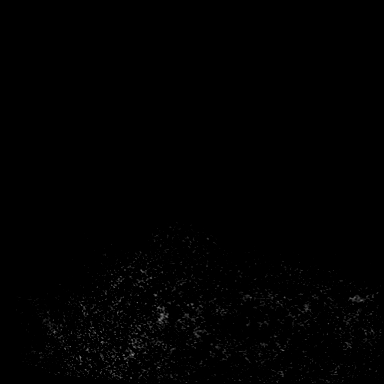

[Series 8: delay · axial · delayed · 1.3mm · 0.73mm/px · z∈[-106,+80]mm · 4 of 144 slices shown]
[im 1/144]
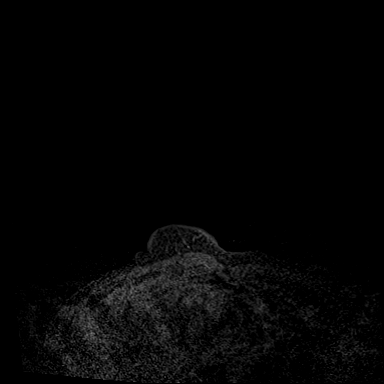
[im 48/144]
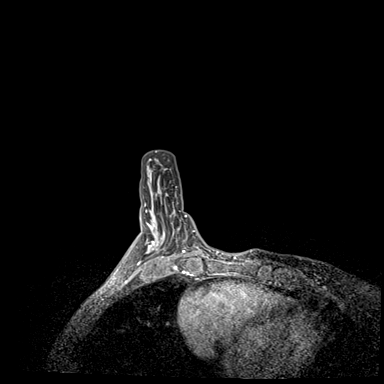
[im 96/144]
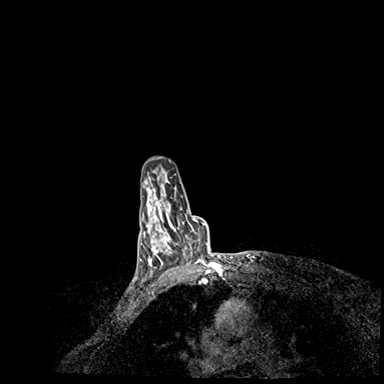
[im 144/144]
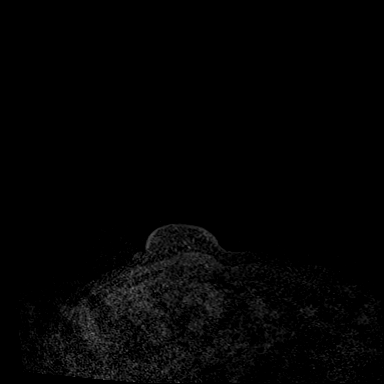

[Series 9: delay_sub · axial · 1.3mm · 0.73mm/px · z∈[-106,+80]mm · 4 of 144 slices shown]
[im 1/144]
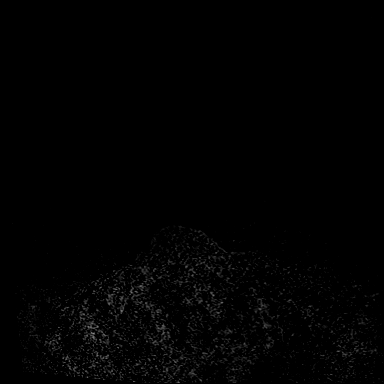
[im 48/144]
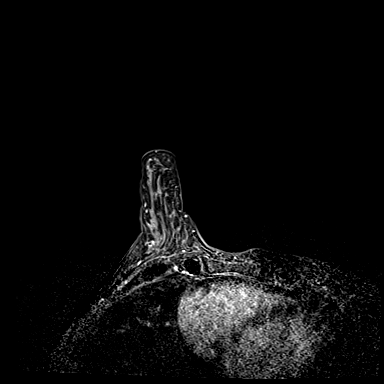
[im 96/144]
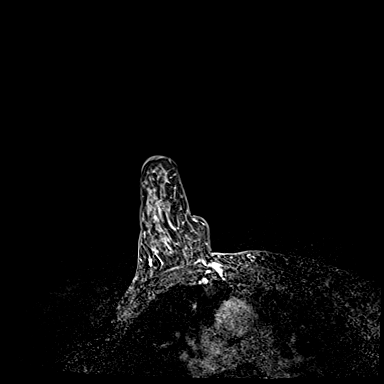
[im 144/144]
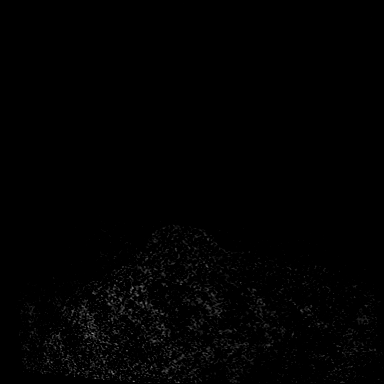

[Series 10: needle placement · axial · 1.3mm · 0.73mm/px · 1 of 144 slices shown]
[im 1/144]
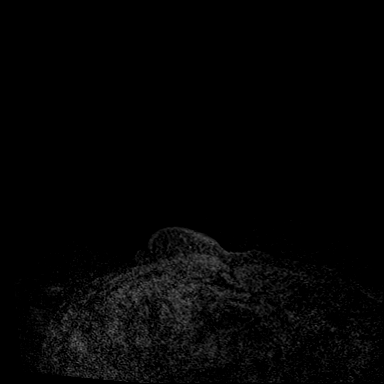

[33 of 48 positions shown; findings below may reference images not displayed]

FINDINGS: I met with the patient, and we discussed the procedure of MRI guided
biopsy, including risks, benefits, and alternatives. Specifically,
we discussed the risks of infection, bleeding, tissue injury, clip
migration, and inadequate sampling. Informed, written consent was
given. The usual time out protocol was performed immediately prior
to the procedure.

Using sterile technique, 1% Lidocaine, MRI guidance, and a 9 gauge
vacuum assisted device, biopsy was performed of the recently
demonstrated 6 mm mass in upper outer right breast using a lateral
approach. At the conclusion of the procedure, a barbell shaped
tissue marker clip was deployed into the biopsy cavity. Follow-up
2-view mammogram was performed and dictated separately.
IMPRESSION: MRI guided biopsy of a 6 mm mass in the upper outer right breast. No
apparent complications.

ADDENDUM:
Pathology revealed FIBROCYSTIC CHANGE, PSEUDOANGIOMATOUS STROMAL
HYPERPLASIA, USUAL DUCT EPITHELIAL HYPERPLASIA of the Right breast,
upper outer quadrant. This was found to be concordant by Dr. NATALY
NATALY.

Pathology results were discussed with the patient by telephone. The
patient reported doing well after the biopsy with tenderness,
bruising and bleeding at the site. Post biopsy instructions and care
were reviewed and questions were answered. The patient was
encouraged to call The [REDACTED] for any
additional concerns. My direct phone number was provided.

The patient was instructed to return for a bilateral breast MRI in 6
months, per protocol.

Pathology results reported by NATALY, RN on [DATE].

*** End of Addendum ***
FINDINGS: I met with the patient, and we discussed the procedure of MRI guided
biopsy, including risks, benefits, and alternatives. Specifically,
we discussed the risks of infection, bleeding, tissue injury, clip
migration, and inadequate sampling. Informed, written consent was
given. The usual time out protocol was performed immediately prior
to the procedure.

Using sterile technique, 1% Lidocaine, MRI guidance, and a 9 gauge
vacuum assisted device, biopsy was performed of the recently
demonstrated 6 mm mass in upper outer right breast using a lateral
approach. At the conclusion of the procedure, a barbell shaped
tissue marker clip was deployed into the biopsy cavity. Follow-up
2-view mammogram was performed and dictated separately.
IMPRESSION: MRI guided biopsy of a 6 mm mass in the upper outer right breast. No
apparent complications.

## 2020-07-27 MED ORDER — GADOBUTROL 1 MMOL/ML IV SOLN
6.0000 mL | Freq: Once | INTRAVENOUS | Status: AC | PRN
  Administered 2020-07-27: 6 mL via INTRAVENOUS

## 2020-08-03 DIAGNOSIS — H35372 Puckering of macula, left eye: Secondary | ICD-10-CM | POA: Diagnosis not present

## 2020-08-03 DIAGNOSIS — H35363 Drusen (degenerative) of macula, bilateral: Secondary | ICD-10-CM | POA: Diagnosis not present

## 2020-08-03 DIAGNOSIS — H25813 Combined forms of age-related cataract, bilateral: Secondary | ICD-10-CM | POA: Diagnosis not present

## 2020-08-03 DIAGNOSIS — H43823 Vitreomacular adhesion, bilateral: Secondary | ICD-10-CM | POA: Diagnosis not present

## 2020-08-03 DIAGNOSIS — Z79899 Other long term (current) drug therapy: Secondary | ICD-10-CM | POA: Diagnosis not present

## 2020-08-21 DIAGNOSIS — I7300 Raynaud's syndrome without gangrene: Secondary | ICD-10-CM | POA: Diagnosis not present

## 2020-08-21 DIAGNOSIS — D72818 Other decreased white blood cell count: Secondary | ICD-10-CM | POA: Diagnosis not present

## 2020-08-21 DIAGNOSIS — M359 Systemic involvement of connective tissue, unspecified: Secondary | ICD-10-CM | POA: Diagnosis not present

## 2020-08-21 DIAGNOSIS — M150 Primary generalized (osteo)arthritis: Secondary | ICD-10-CM | POA: Diagnosis not present

## 2020-08-22 ENCOUNTER — Other Ambulatory Visit: Payer: Self-pay

## 2020-08-22 ENCOUNTER — Ambulatory Visit
Admission: RE | Admit: 2020-08-22 | Discharge: 2020-08-22 | Disposition: A | Discharge status: Home / Self Care | Payer: BLUE CROSS/BLUE SHIELD | Source: Ambulatory Visit | Attending: Family Medicine | Admitting: Family Medicine

## 2020-08-22 DIAGNOSIS — E2839 Other primary ovarian failure: Secondary | ICD-10-CM

## 2020-08-22 DIAGNOSIS — M85852 Other specified disorders of bone density and structure, left thigh: Secondary | ICD-10-CM | POA: Diagnosis not present

## 2020-08-22 DIAGNOSIS — Z780 Asymptomatic menopausal state: Secondary | ICD-10-CM | POA: Diagnosis not present

## 2020-09-10 DIAGNOSIS — W57XXXA Bitten or stung by nonvenomous insect and other nonvenomous arthropods, initial encounter: Secondary | ICD-10-CM | POA: Diagnosis not present

## 2020-09-10 DIAGNOSIS — S0006XA Insect bite (nonvenomous) of scalp, initial encounter: Secondary | ICD-10-CM | POA: Diagnosis not present

## 2020-09-17 DIAGNOSIS — J069 Acute upper respiratory infection, unspecified: Secondary | ICD-10-CM | POA: Diagnosis not present

## 2020-09-18 DIAGNOSIS — J069 Acute upper respiratory infection, unspecified: Secondary | ICD-10-CM | POA: Diagnosis not present

## 2020-09-18 DIAGNOSIS — Z03818 Encounter for observation for suspected exposure to other biological agents ruled out: Secondary | ICD-10-CM | POA: Diagnosis not present

## 2020-09-21 ENCOUNTER — Ambulatory Visit
Admission: RE | Admit: 2020-09-21 | Discharge: 2020-09-21 | Disposition: A | Discharge status: Home / Self Care | Payer: BC Managed Care – PPO | Source: Ambulatory Visit | Attending: Family Medicine | Admitting: Family Medicine

## 2020-09-21 ENCOUNTER — Other Ambulatory Visit: Payer: Self-pay | Admitting: Family Medicine

## 2020-09-21 DIAGNOSIS — R059 Cough, unspecified: Secondary | ICD-10-CM

## 2020-09-21 DIAGNOSIS — R0989 Other specified symptoms and signs involving the circulatory and respiratory systems: Secondary | ICD-10-CM | POA: Diagnosis not present

## 2020-09-21 IMAGING — DX DG CHEST 2V
2 series · 2 of 2 positions shown · non-contrast
Comparison: [DATE]

CLINICAL DATA: Cough since last week, history of lupus

EXAM:
CHEST - 2 VIEW

[dg chest 2 view (1 of 2)]
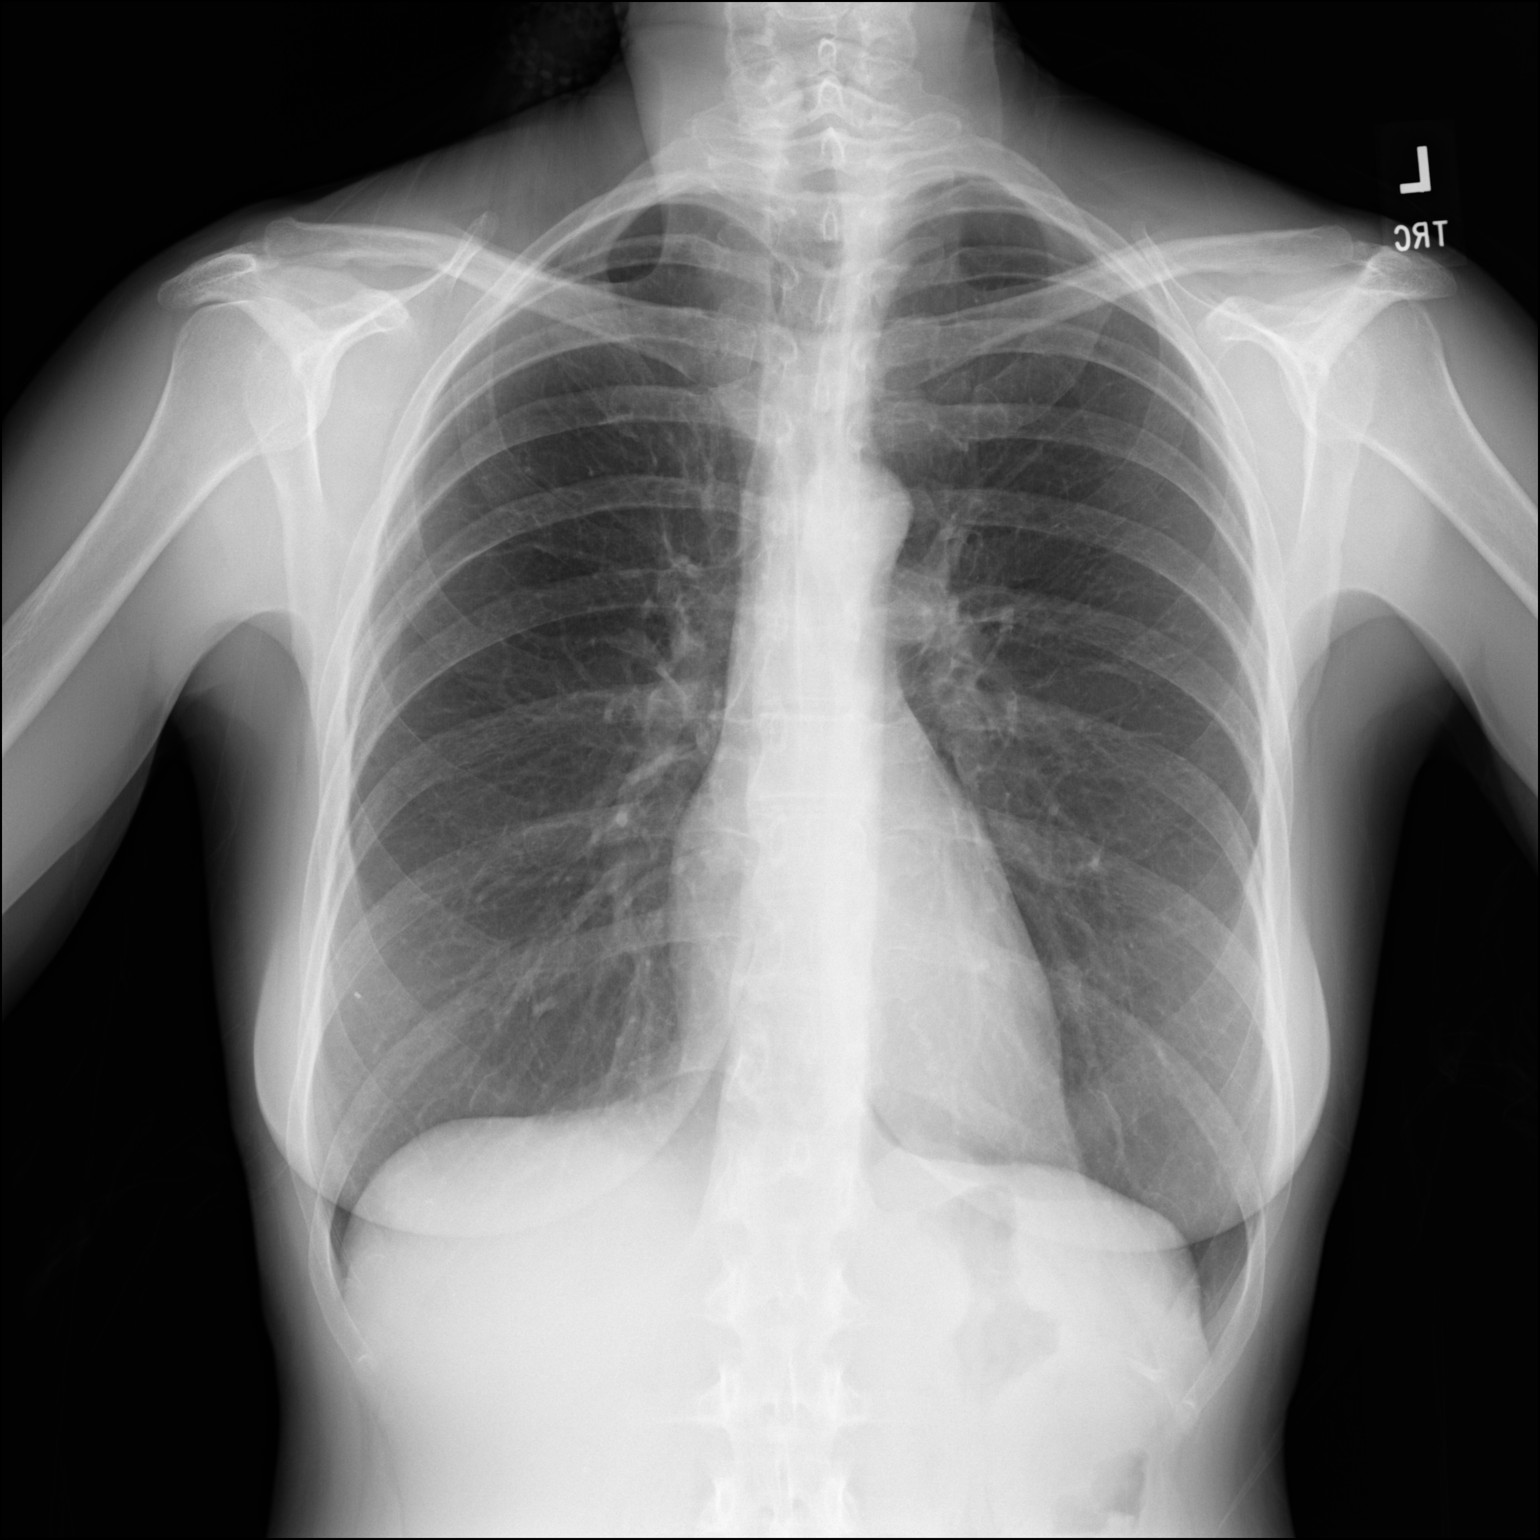

[dg chest 2 view (2 of 2)]
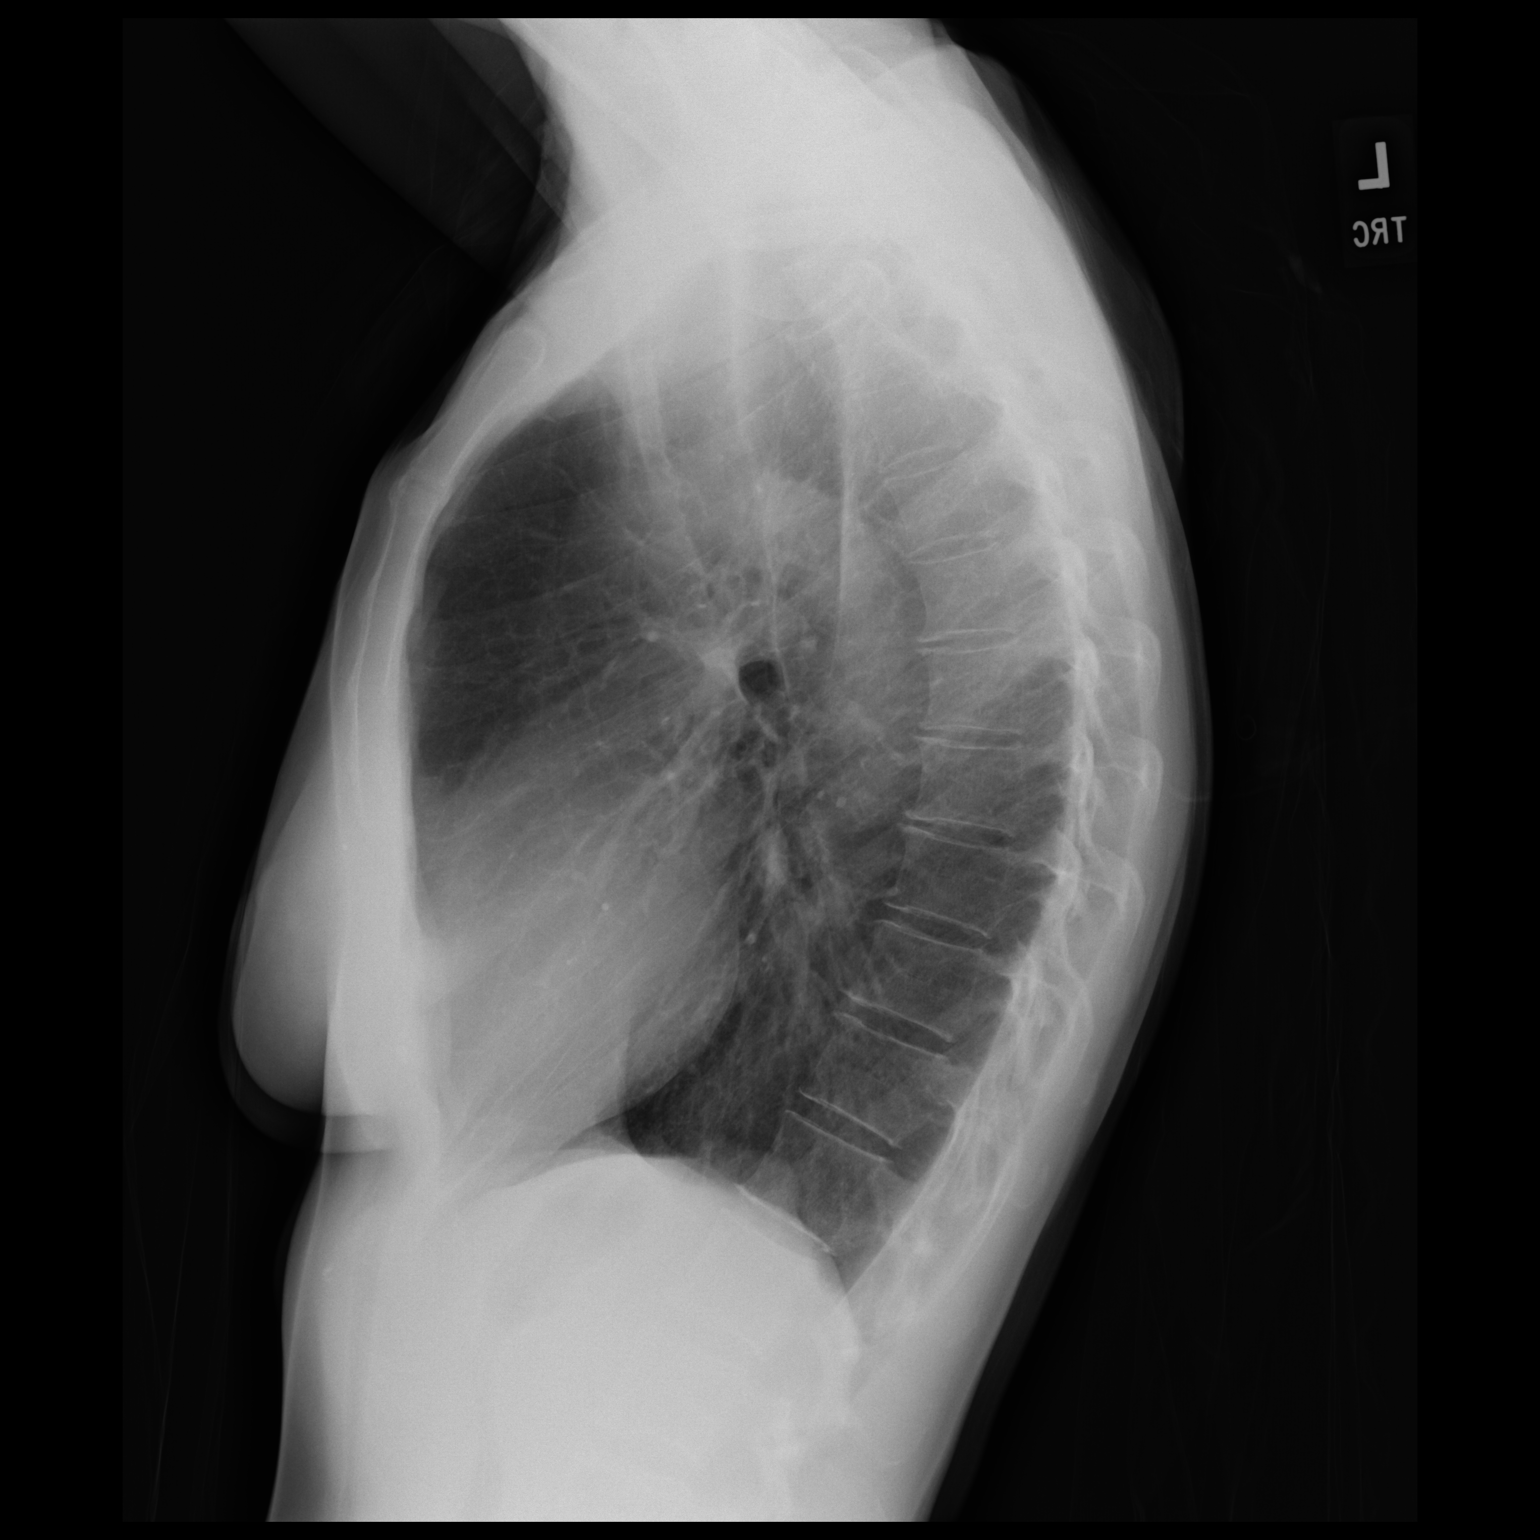

[2 of 2 positions shown; findings below may reference images not displayed]

FINDINGS: Normal heart size, mediastinal contours, and pulmonary vascularity.

Lungs hyperinflated but clear.

No pulmonary infiltrate, pleural effusion, or pneumothorax.

Osseous structures unremarkable.
IMPRESSION: Chronic pulmonary hyperinflation without acute infiltrate.

## 2020-09-25 DIAGNOSIS — G245 Blepharospasm: Secondary | ICD-10-CM | POA: Diagnosis not present

## 2020-10-06 ENCOUNTER — Encounter (HOSPITAL_BASED_OUTPATIENT_CLINIC_OR_DEPARTMENT_OTHER): Payer: Self-pay | Admitting: Emergency Medicine

## 2020-10-06 ENCOUNTER — Emergency Department (HOSPITAL_BASED_OUTPATIENT_CLINIC_OR_DEPARTMENT_OTHER): Payer: BLUE CROSS/BLUE SHIELD

## 2020-10-06 ENCOUNTER — Other Ambulatory Visit: Payer: Self-pay

## 2020-10-06 ENCOUNTER — Emergency Department (HOSPITAL_BASED_OUTPATIENT_CLINIC_OR_DEPARTMENT_OTHER)
Admission: EM | Admit: 2020-10-06 | Discharge: 2020-10-06 | Disposition: A | Discharge status: Home / Self Care | Payer: BLUE CROSS/BLUE SHIELD | Attending: Emergency Medicine | Admitting: Emergency Medicine

## 2020-10-06 DIAGNOSIS — S61011A Laceration without foreign body of right thumb without damage to nail, initial encounter: Secondary | ICD-10-CM | POA: Diagnosis not present

## 2020-10-06 DIAGNOSIS — Z7982 Long term (current) use of aspirin: Secondary | ICD-10-CM | POA: Insufficient documentation

## 2020-10-06 DIAGNOSIS — Z7722 Contact with and (suspected) exposure to environmental tobacco smoke (acute) (chronic): Secondary | ICD-10-CM | POA: Insufficient documentation

## 2020-10-06 DIAGNOSIS — W274XXA Contact with kitchen utensil, initial encounter: Secondary | ICD-10-CM | POA: Diagnosis not present

## 2020-10-06 DIAGNOSIS — Z79899 Other long term (current) drug therapy: Secondary | ICD-10-CM | POA: Diagnosis not present

## 2020-10-06 DIAGNOSIS — Z91040 Latex allergy status: Secondary | ICD-10-CM | POA: Diagnosis not present

## 2020-10-06 DIAGNOSIS — E039 Hypothyroidism, unspecified: Secondary | ICD-10-CM | POA: Diagnosis not present

## 2020-10-06 DIAGNOSIS — S60222A Contusion of left hand, initial encounter: Secondary | ICD-10-CM | POA: Diagnosis not present

## 2020-10-06 DIAGNOSIS — S61209A Unspecified open wound of unspecified finger without damage to nail, initial encounter: Secondary | ICD-10-CM

## 2020-10-06 DIAGNOSIS — S60931A Unspecified superficial injury of right thumb, initial encounter: Secondary | ICD-10-CM | POA: Diagnosis not present

## 2020-10-06 DIAGNOSIS — S6992XA Unspecified injury of left wrist, hand and finger(s), initial encounter: Secondary | ICD-10-CM | POA: Diagnosis not present

## 2020-10-06 DIAGNOSIS — M7989 Other specified soft tissue disorders: Secondary | ICD-10-CM | POA: Diagnosis not present

## 2020-10-06 IMAGING — DX DG HAND COMPLETE 3+V*L*
1 series · 3 of 3 positions shown · non-contrast
Comparison: Left fourth finger [DATE]

CLINICAL DATA: Hit hand on door

EXAM:
LEFT HAND - COMPLETE 3+ VIEW

[Series 1: hand · 0.14mm/px · 3 of 3 slices shown]
[im 1/3]
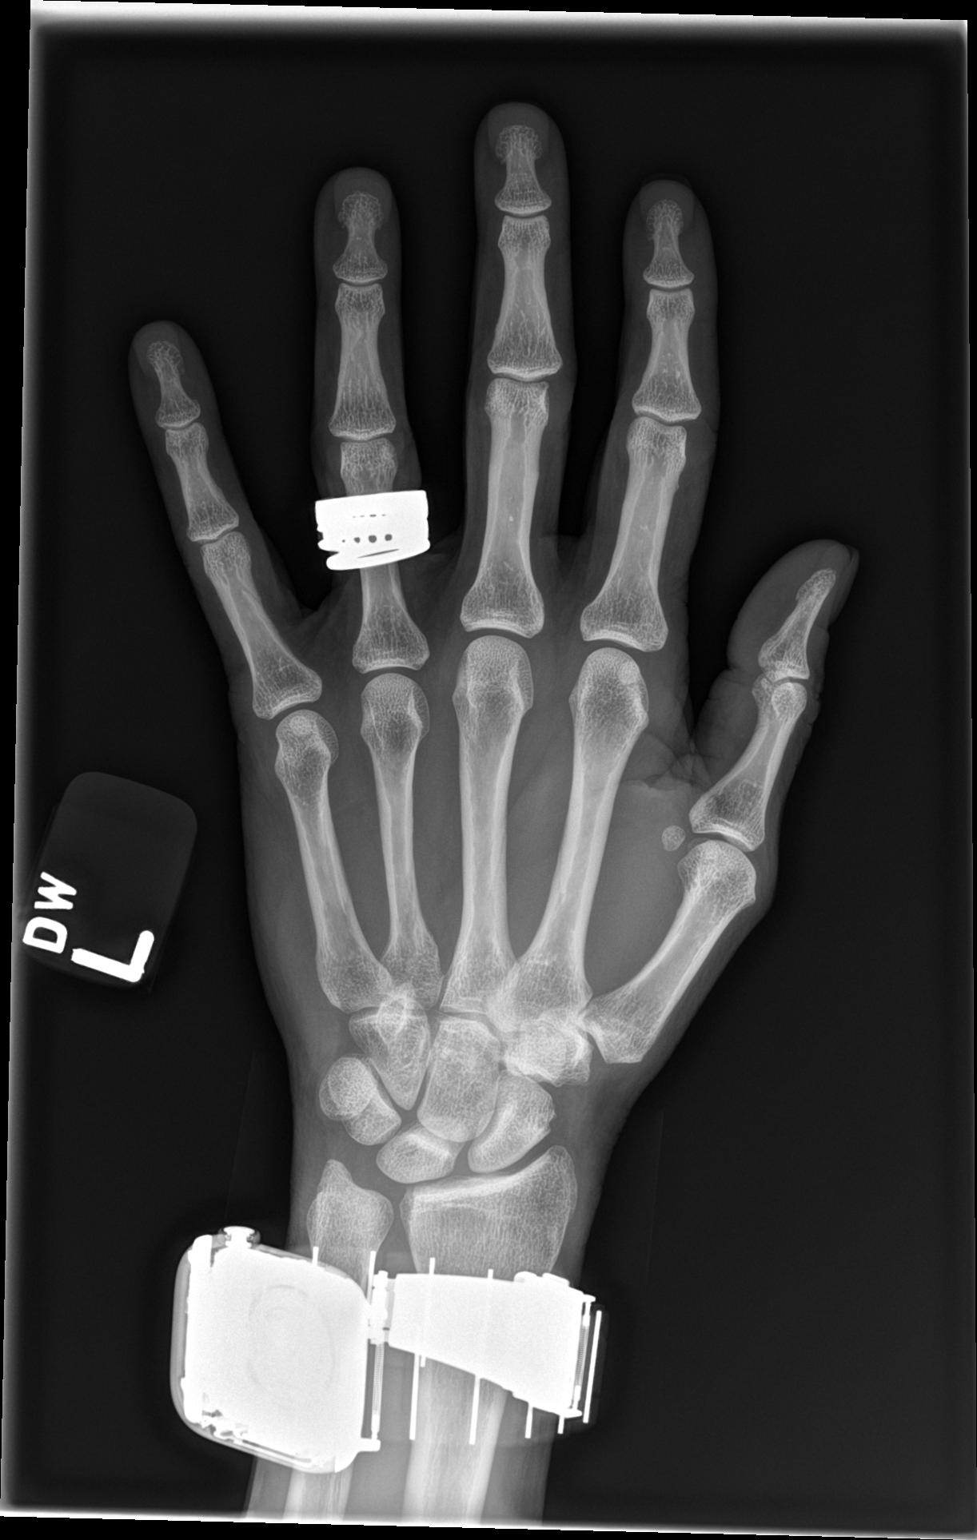
[im 2/3]
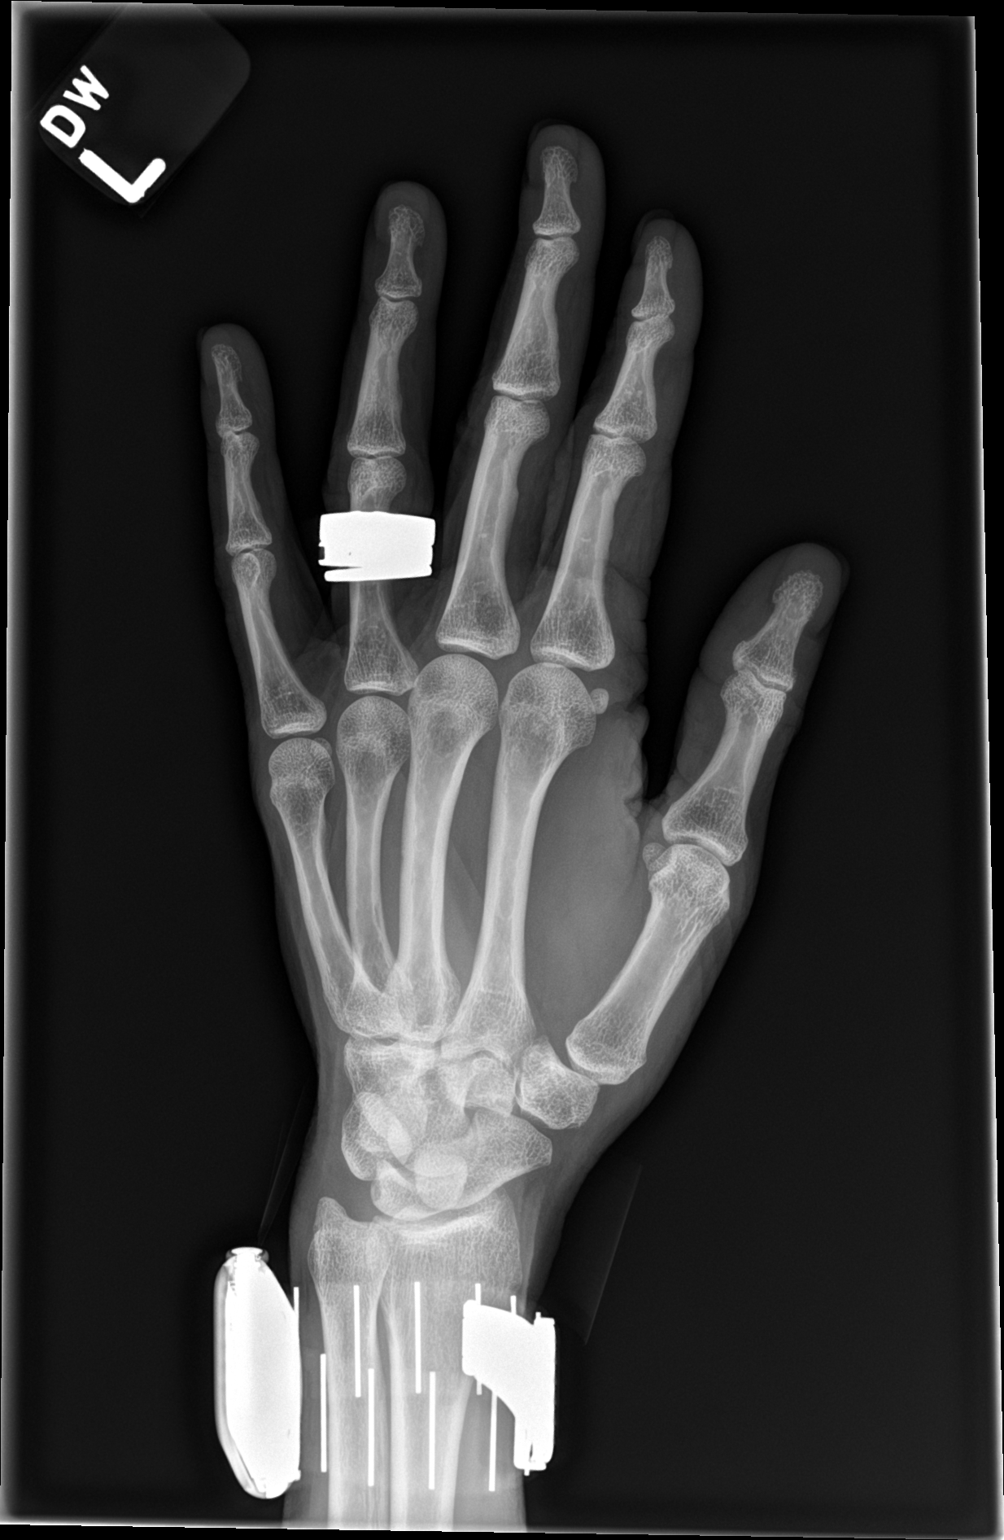
[im 3/3]
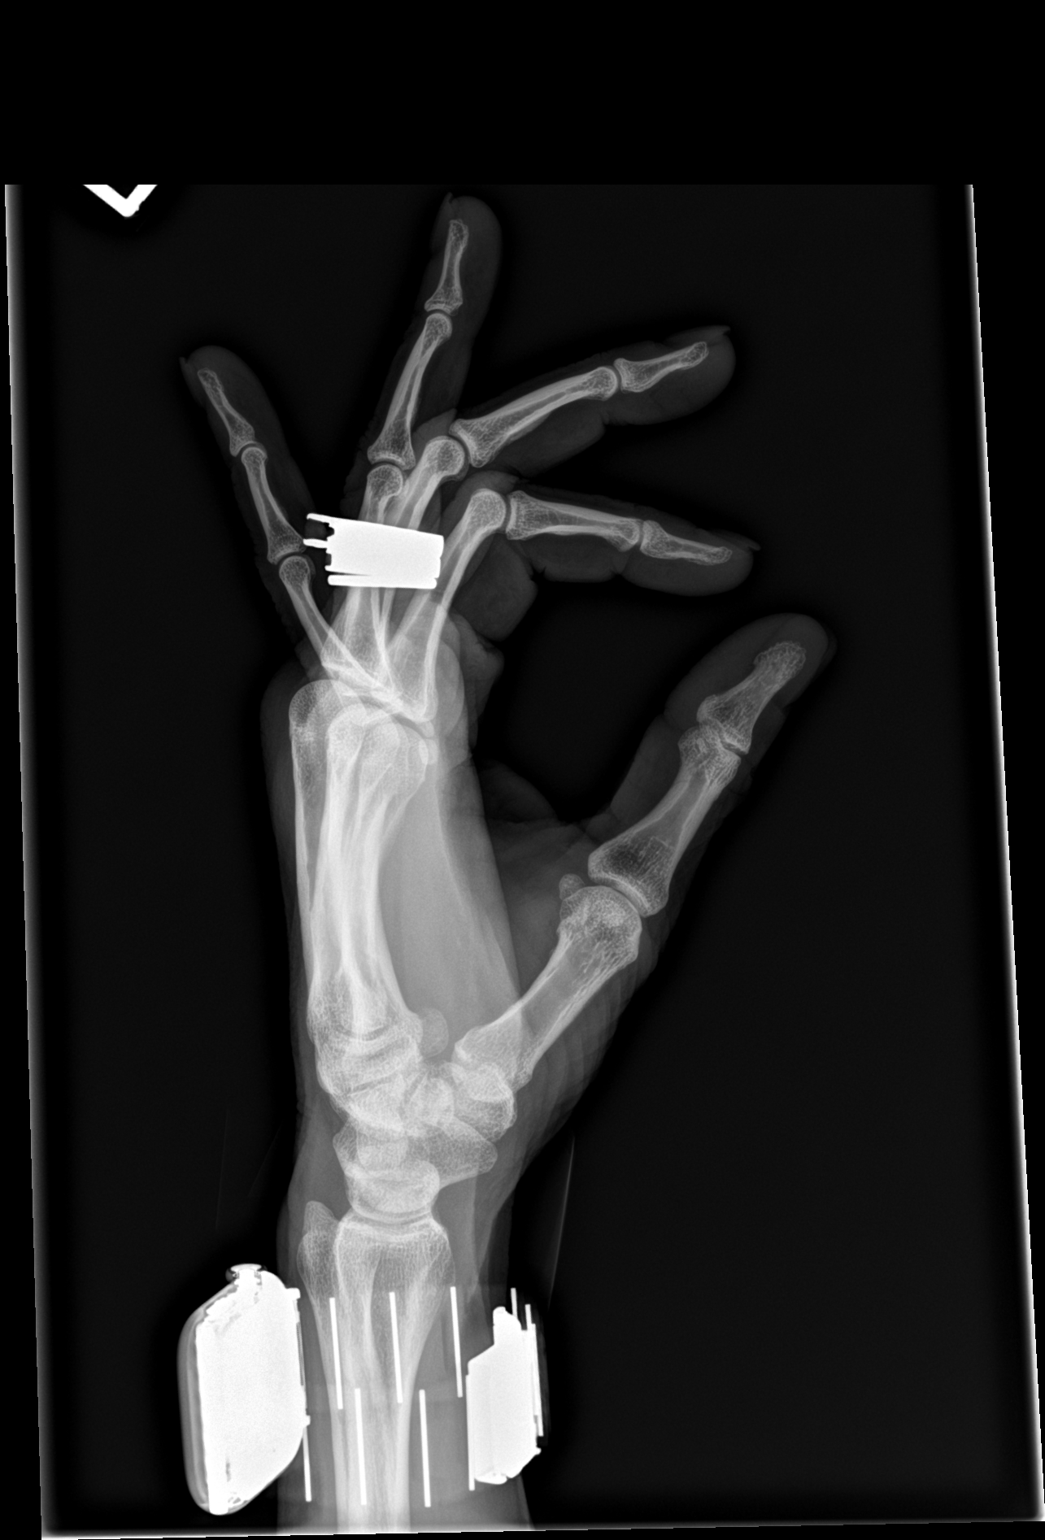

[3 of 3 positions shown; findings below may reference images not displayed]

FINDINGS: Frontal, oblique, and lateral views obtained. Ring obscures a
portion of the fourth proximal phalanx. In bony structures which are
visualized, there is no fracture or dislocation. Joint spaces appear
normal. No erosive change. Soft tissue swelling over distal third
metacarpal.
IMPRESSION: Ring obscures a portion of the fourth proximal phalanx. Visualized
bony structures appear intact without fracture or dislocation. Soft
tissue swelling over distal third metacarpal. No joint space
narrowing or erosion.

## 2020-10-06 MED ORDER — ACETAMINOPHEN 325 MG PO TABS
650.0000 mg | ORAL_TABLET | Freq: Once | ORAL | Status: AC
  Administered 2020-10-06: 650 mg via ORAL
  Filled 2020-10-06: qty 2
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
## 2020-10-06 NOTE — ED Notes (Signed)
Pt verbalizes understanding of discharge instructions. Opportunity for questioning and answers were provided. Armand removed by staff, pt discharged from ED to home. Bag of wound care given to patient.

## 2020-10-06 NOTE — Discharge Instructions (Addendum)
It was our pleasure to provide your ER care today - we hope that you feel better.  Your xray is good, no fracture is seen.  Icepack to sore area.   Keep wound very clean. Change dressing daily, and as need.   Return to ER if worse, new symptoms, infection of wound, or other concern.
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
## 2020-10-18 DIAGNOSIS — G47419 Narcolepsy without cataplexy: Secondary | ICD-10-CM | POA: Diagnosis not present

## 2020-10-18 DIAGNOSIS — G43009 Migraine without aura, not intractable, without status migrainosus: Secondary | ICD-10-CM | POA: Diagnosis not present

## 2020-10-18 DIAGNOSIS — G4719 Other hypersomnia: Secondary | ICD-10-CM | POA: Diagnosis not present

## 2020-10-25 ENCOUNTER — Encounter: Payer: Self-pay | Admitting: Pulmonary Disease

## 2020-10-25 ENCOUNTER — Other Ambulatory Visit: Payer: Self-pay

## 2020-10-25 ENCOUNTER — Ambulatory Visit (INDEPENDENT_AMBULATORY_CARE_PROVIDER_SITE_OTHER): Payer: BC Managed Care – PPO | Admitting: Pulmonary Disease

## 2020-10-25 VITALS — BP 118/74 | HR 64 | Temp 99.2°F | Ht 65.0 in | Wt 113.4 lb

## 2020-10-25 DIAGNOSIS — Z79899 Other long term (current) drug therapy: Secondary | ICD-10-CM | POA: Diagnosis not present

## 2020-10-25 DIAGNOSIS — E039 Hypothyroidism, unspecified: Secondary | ICD-10-CM | POA: Diagnosis not present

## 2020-10-25 DIAGNOSIS — J42 Unspecified chronic bronchitis: Secondary | ICD-10-CM | POA: Insufficient documentation

## 2020-10-25 DIAGNOSIS — Z131 Encounter for screening for diabetes mellitus: Secondary | ICD-10-CM | POA: Diagnosis not present

## 2020-10-25 DIAGNOSIS — Z0000 Encounter for general adult medical examination without abnormal findings: Secondary | ICD-10-CM | POA: Diagnosis not present

## 2020-10-25 DIAGNOSIS — E7800 Pure hypercholesterolemia, unspecified: Secondary | ICD-10-CM | POA: Diagnosis not present

## 2020-10-25 DIAGNOSIS — E559 Vitamin D deficiency, unspecified: Secondary | ICD-10-CM | POA: Diagnosis not present

## 2020-10-25 DIAGNOSIS — R0602 Shortness of breath: Secondary | ICD-10-CM | POA: Insufficient documentation

## 2020-10-25 MED ORDER — ADVAIR HFA 115-21 MCG/ACT IN AERO: 2.0000 | INHALATION_SPRAY | Freq: Two times a day (BID) | RESPIRATORY_TRACT | 12 refills | Status: DC
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
## 2020-10-25 NOTE — Patient Instructions (Addendum)
Chronic Bronchitis Suspected Obstructive Lung Defect --START Advair HFA 115-21 mcg TWO puffs TWICE a day --ARRANGE for pulmonary function tests prior to next visit  Follow-up with me in 2 months

## 2020-12-19 DIAGNOSIS — Z01419 Encounter for gynecological examination (general) (routine) without abnormal findings: Secondary | ICD-10-CM | POA: Diagnosis not present

## 2020-12-19 DIAGNOSIS — Z124 Encounter for screening for malignant neoplasm of cervix: Secondary | ICD-10-CM | POA: Diagnosis not present

## 2020-12-19 DIAGNOSIS — L292 Pruritus vulvae: Secondary | ICD-10-CM | POA: Diagnosis not present

## 2020-12-19 DIAGNOSIS — Z803 Family history of malignant neoplasm of breast: Secondary | ICD-10-CM | POA: Diagnosis not present

## 2020-12-19 DIAGNOSIS — Z113 Encounter for screening for infections with a predominantly sexual mode of transmission: Secondary | ICD-10-CM | POA: Diagnosis not present

## 2020-12-19 DIAGNOSIS — N901 Moderate vulvar dysplasia: Secondary | ICD-10-CM | POA: Diagnosis not present

## 2020-12-19 DIAGNOSIS — B977 Papillomavirus as the cause of diseases classified elsewhere: Secondary | ICD-10-CM | POA: Diagnosis not present

## 2020-12-19 DIAGNOSIS — Z681 Body mass index (BMI) 19 or less, adult: Secondary | ICD-10-CM | POA: Diagnosis not present

## 2020-12-21 ENCOUNTER — Other Ambulatory Visit (HOSPITAL_COMMUNITY)
Admission: RE | Admit: 2020-12-21 | Discharge: 2020-12-21 | Disposition: A | Discharge status: Home / Self Care | Payer: BLUE CROSS/BLUE SHIELD | Source: Ambulatory Visit | Attending: Pulmonary Disease | Admitting: Pulmonary Disease

## 2020-12-21 DIAGNOSIS — Z20822 Contact with and (suspected) exposure to covid-19: Secondary | ICD-10-CM | POA: Diagnosis not present

## 2020-12-21 DIAGNOSIS — Z01812 Encounter for preprocedural laboratory examination: Secondary | ICD-10-CM | POA: Insufficient documentation

## 2020-12-21 LAB — SARS CORONAVIRUS 2 (TAT 6-24 HRS): SARS Coronavirus 2: NEGATIVE

## 2020-12-24 ENCOUNTER — Other Ambulatory Visit: Payer: Self-pay | Admitting: *Deleted

## 2020-12-24 DIAGNOSIS — J42 Unspecified chronic bronchitis: Secondary | ICD-10-CM

## 2020-12-24 NOTE — Progress Notes (Signed)
T

## 2020-12-25 ENCOUNTER — Ambulatory Visit (INDEPENDENT_AMBULATORY_CARE_PROVIDER_SITE_OTHER): Payer: BC Managed Care – PPO | Admitting: Pulmonary Disease

## 2020-12-25 ENCOUNTER — Other Ambulatory Visit: Payer: Self-pay

## 2020-12-25 DIAGNOSIS — J42 Unspecified chronic bronchitis: Secondary | ICD-10-CM

## 2020-12-25 LAB — PULMONARY FUNCTION TEST
DL/VA % pred: 107 %
DL/VA: 4.53 ml/min/mmHg/L
DLCO cor % pred: 105 %
DLCO cor: 22.39 ml/min/mmHg
DLCO unc % pred: 105 %
DLCO unc: 22.39 ml/min/mmHg
FEF 25-75 Post: 3.14 L/sec
FEF 25-75 Pre: 2.97 L/sec
FEF2575-%Change-Post: 5 %
FEF2575-%Pred-Post: 123 %
FEF2575-%Pred-Pre: 116 %
FEV1-%Change-Post: 2 %
FEV1-%Pred-Post: 107 %
FEV1-%Pred-Pre: 105 %
FEV1-Post: 2.94 L
FEV1-Pre: 2.88 L
FEV1FVC-%Change-Post: 1 %
FEV1FVC-%Pred-Pre: 106 %
FEV6-%Change-Post: 0 %
FEV6-%Pred-Post: 101 %
FEV6-%Pred-Pre: 101 %
FEV6-Post: 3.45 L
FEV6-Pre: 3.44 L
FEV6FVC-%Change-Post: 0 %
FEV6FVC-%Pred-Post: 102 %
FEV6FVC-%Pred-Pre: 103 %
FVC-%Change-Post: 0 %
FVC-%Pred-Post: 98 %
FVC-%Pred-Pre: 98 %
FVC-Post: 3.47 L
FVC-Pre: 3.45 L
Post FEV1/FVC ratio: 85 %
Post FEV6/FVC ratio: 100 %
Pre FEV1/FVC ratio: 83 %
Pre FEV6/FVC Ratio: 100 %
RV % pred: 124 %
RV: 2.46 L
TLC % pred: 112 %
TLC: 5.84 L

## 2020-12-25 NOTE — Progress Notes (Signed)
PFT done today. 

## 2021-01-14 DIAGNOSIS — Z7189 Other specified counseling: Secondary | ICD-10-CM | POA: Diagnosis not present

## 2021-01-15 ENCOUNTER — Encounter: Payer: Self-pay | Admitting: Pulmonary Disease

## 2021-01-15 ENCOUNTER — Other Ambulatory Visit: Payer: Self-pay

## 2021-01-15 ENCOUNTER — Ambulatory Visit (INDEPENDENT_AMBULATORY_CARE_PROVIDER_SITE_OTHER): Payer: BC Managed Care – PPO | Admitting: Pulmonary Disease

## 2021-01-15 VITALS — BP 120/72 | HR 69 | Temp 97.9°F | Ht 65.0 in | Wt 115.8 lb

## 2021-01-15 DIAGNOSIS — R0602 Shortness of breath: Secondary | ICD-10-CM | POA: Diagnosis not present

## 2021-01-15 DIAGNOSIS — J42 Unspecified chronic bronchitis: Secondary | ICD-10-CM | POA: Diagnosis not present
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
## 2021-01-15 NOTE — Patient Instructions (Addendum)
Chronic Bronchitis Shortness of breath --CONTINUE Advair HFA 115-21 mcg TWO puffs TWICE a day. Use with a spacer --CT Chest without contrast in one month  Follow-up with me after CT Chest

## 2021-01-16 ENCOUNTER — Other Ambulatory Visit: Payer: Self-pay | Admitting: Obstetrics

## 2021-01-16 DIAGNOSIS — Z803 Family history of malignant neoplasm of breast: Secondary | ICD-10-CM

## 2021-01-16 DIAGNOSIS — J42 Unspecified chronic bronchitis: Secondary | ICD-10-CM | POA: Diagnosis not present

## 2021-01-17 ENCOUNTER — Encounter: Payer: Self-pay | Admitting: Pulmonary Disease

## 2021-01-24 ENCOUNTER — Other Ambulatory Visit (HOSPITAL_BASED_OUTPATIENT_CLINIC_OR_DEPARTMENT_OTHER): Payer: Self-pay | Admitting: Family Medicine

## 2021-01-24 DIAGNOSIS — E7800 Pure hypercholesterolemia, unspecified: Secondary | ICD-10-CM

## 2021-02-11 ENCOUNTER — Other Ambulatory Visit: Payer: BC Managed Care – PPO

## 2021-02-12 ENCOUNTER — Other Ambulatory Visit: Payer: BC Managed Care – PPO

## 2021-02-13 ENCOUNTER — Ambulatory Visit (HOSPITAL_BASED_OUTPATIENT_CLINIC_OR_DEPARTMENT_OTHER): Payer: BC Managed Care – PPO

## 2021-02-18 DIAGNOSIS — U071 COVID-19: Secondary | ICD-10-CM | POA: Diagnosis not present

## 2021-02-18 DIAGNOSIS — G4711 Idiopathic hypersomnia with long sleep time: Secondary | ICD-10-CM | POA: Diagnosis not present

## 2021-02-18 DIAGNOSIS — G43009 Migraine without aura, not intractable, without status migrainosus: Secondary | ICD-10-CM | POA: Diagnosis not present

## 2021-02-18 DIAGNOSIS — G47419 Narcolepsy without cataplexy: Secondary | ICD-10-CM | POA: Diagnosis not present

## 2021-02-22 ENCOUNTER — Ambulatory Visit: Payer: BC Managed Care – PPO | Admitting: Pulmonary Disease

## 2021-02-27 ENCOUNTER — Ambulatory Visit
Admission: RE | Admit: 2021-02-27 | Discharge: 2021-02-27 | Disposition: A | Discharge status: Home / Self Care | Payer: BC Managed Care – PPO | Source: Ambulatory Visit | Attending: Pulmonary Disease | Admitting: Pulmonary Disease

## 2021-02-27 ENCOUNTER — Ambulatory Visit
Admission: RE | Admit: 2021-02-27 | Discharge: 2021-02-27 | Disposition: A | Discharge status: Home / Self Care | Payer: BC Managed Care – PPO | Source: Ambulatory Visit | Attending: Obstetrics | Admitting: Obstetrics

## 2021-02-27 ENCOUNTER — Other Ambulatory Visit: Payer: Self-pay

## 2021-02-27 DIAGNOSIS — R911 Solitary pulmonary nodule: Secondary | ICD-10-CM | POA: Diagnosis not present

## 2021-02-27 DIAGNOSIS — J42 Unspecified chronic bronchitis: Secondary | ICD-10-CM

## 2021-02-27 DIAGNOSIS — R0602 Shortness of breath: Secondary | ICD-10-CM | POA: Diagnosis not present

## 2021-02-27 DIAGNOSIS — Z803 Family history of malignant neoplasm of breast: Secondary | ICD-10-CM

## 2021-02-27 DIAGNOSIS — N6011 Diffuse cystic mastopathy of right breast: Secondary | ICD-10-CM | POA: Diagnosis not present

## 2021-02-27 DIAGNOSIS — R918 Other nonspecific abnormal finding of lung field: Secondary | ICD-10-CM | POA: Diagnosis not present

## 2021-02-27 DIAGNOSIS — N6012 Diffuse cystic mastopathy of left breast: Secondary | ICD-10-CM | POA: Diagnosis not present

## 2021-02-27 IMAGING — MR MR BREAST BILAT WO/W CM
8 of 12 series · 33 of 48 positions shown · IV contrast (5 ml gadavist)
Comparison: Previous exam(s).

CLINICAL DATA: 60-year-old female for six-month follow-up of benign
MR biopsy of a 6 mm UPPER-OUTER RIGHT breast mass. Strong family
history of breast cancer.

LABS:  None performed today
EXAM:
BILATERAL BREAST MRI WITH AND WITHOUT CONTRAST
TECHNIQUE: Multiplanar, multisequence MR images of both breasts were obtained
prior to and following the intravenous administration of 5 ml of
Gadavist

[Series 2: t2_tirm_tra ipat (a-p) · axial · 3.0mm · 0.66mm/px · 1 of 55 slices shown]
[im 1/55]
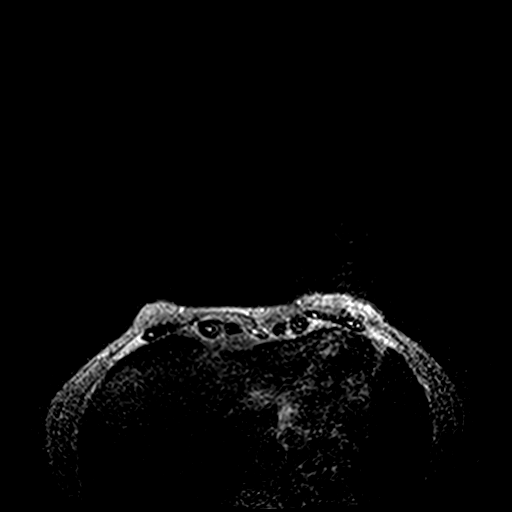

[Series 3: fl3d pre-cm non · axial · non-contrast · 1.2mm · 0.89mm/px · z∈[-95,+77]mm · 5 of 144 slices shown]
[im 1/144]
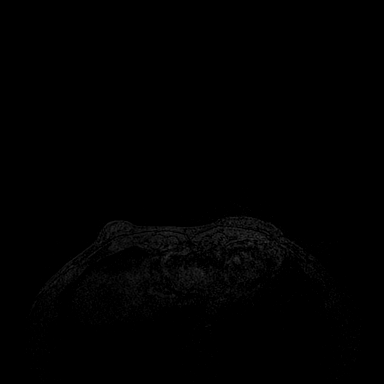
[im 36/144]
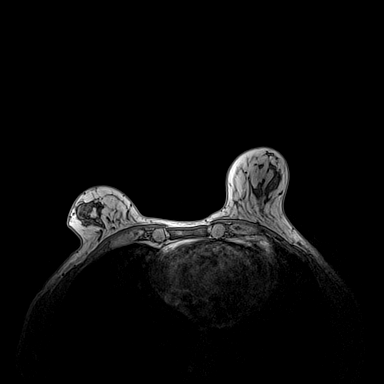
[im 72/144]
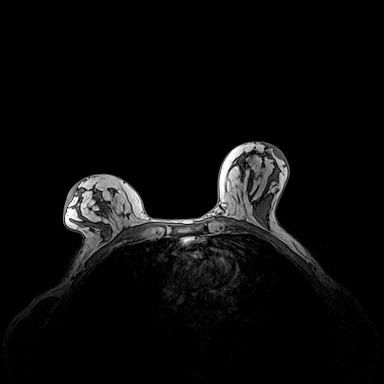
[im 108/144]
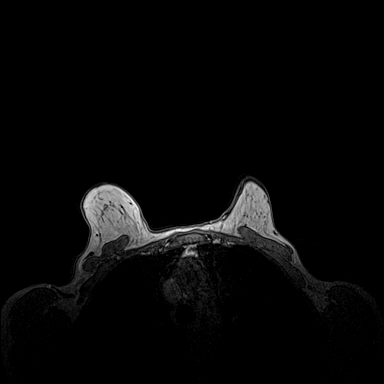
[im 144/144]
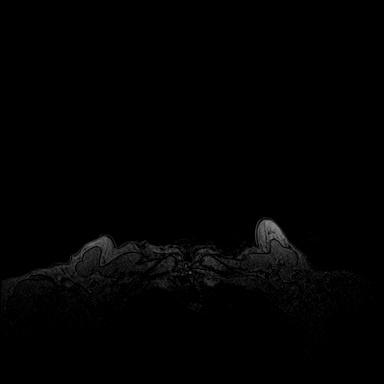

[Series 4: fl3d pre-cm · axial · non-contrast · 1.2mm · 0.89mm/px · z∈[-95,+77]mm · 5 of 144 slices shown]
[im 1/144]
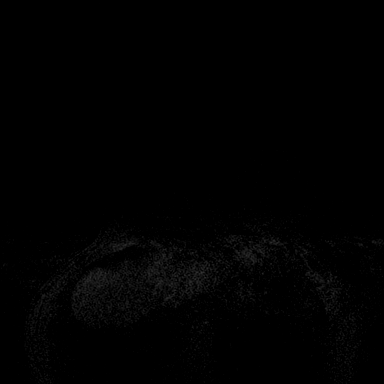
[im 36/144]
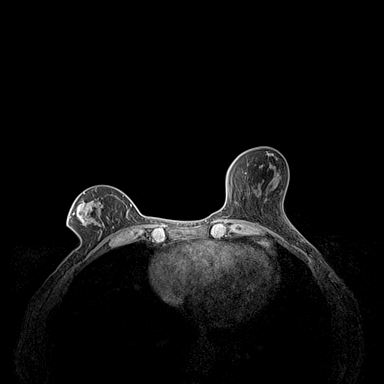
[im 72/144]
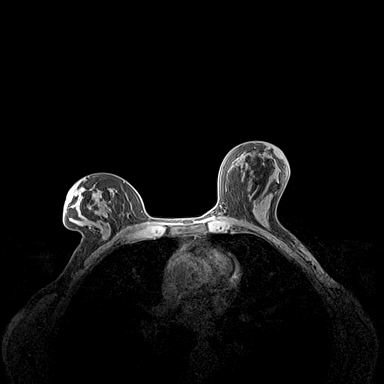
[im 108/144]
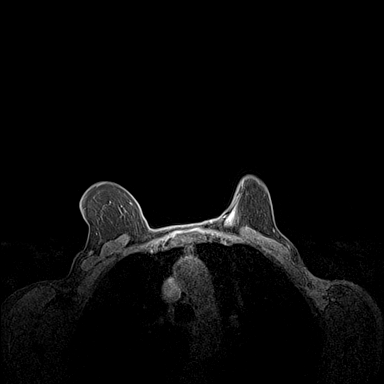
[im 144/144]
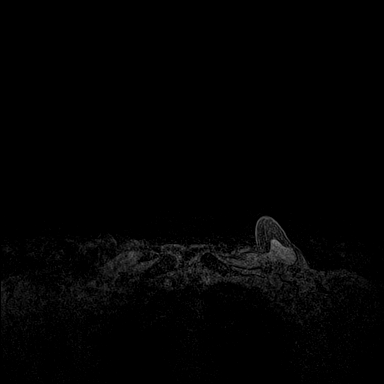

[Series 5: fl3d pre-cm 20 · axial · non-contrast · 1.2mm · 0.89mm/px · z∈[-95,+77]mm · 5 of 144 slices shown (1 of 3)]
[im 1/144]
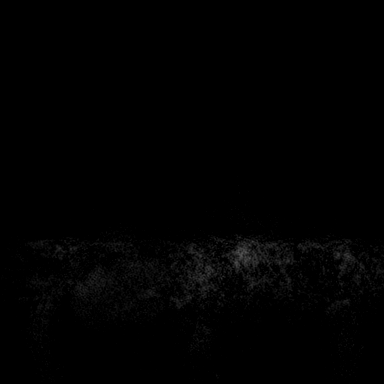
[im 36/144]
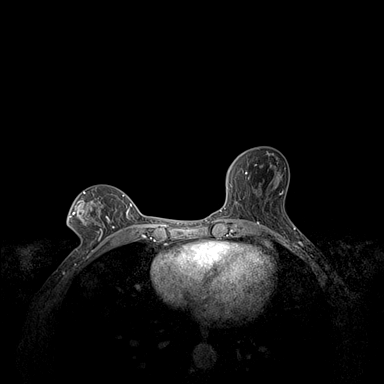
[im 72/144]
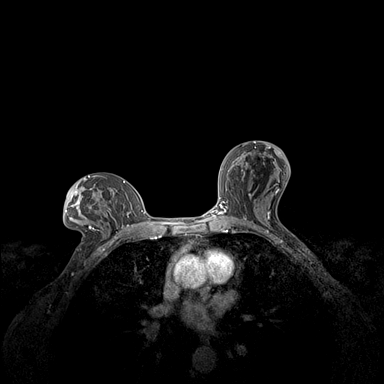
[im 108/144]
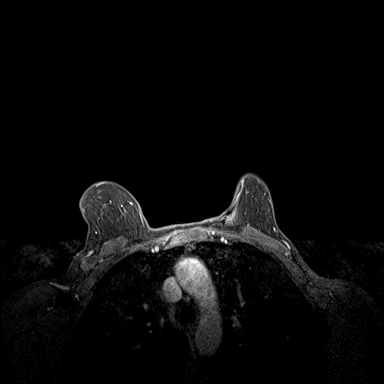
[im 144/144]
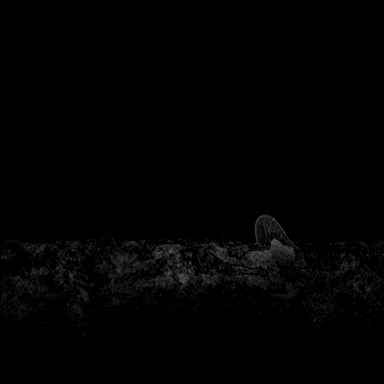

[Series 6: fl3d pre-cm 20 · axial · non-contrast · 1.2mm · 0.89mm/px · z∈[-95,+77]mm · 5 of 144 slices shown (2 of 3)]
[im 1/144]
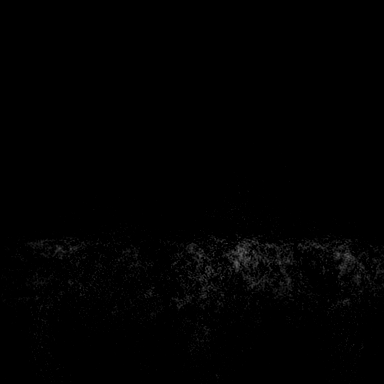
[im 36/144]
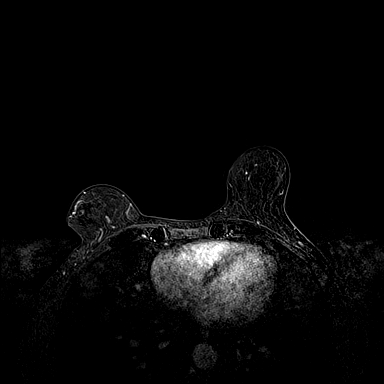
[im 72/144]
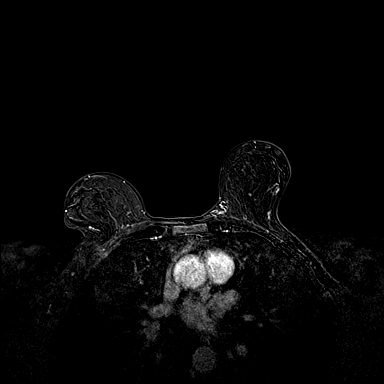
[im 108/144]
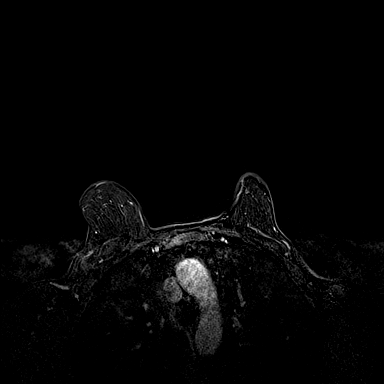
[im 144/144]
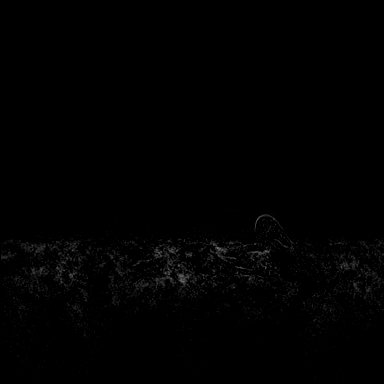

[Series 7: fl3d pre-cm 20 · axial · non-contrast · 172.8mm · 0.89mm/px · 1 of 1 slices shown (3 of 3)]
[im 1/1]
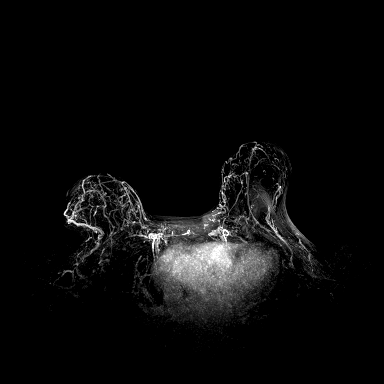

[Series 8: fl3d pre-cm 3min · axial · non-contrast · 1.2mm · 0.89mm/px · z∈[-95,+77]mm · 6 of 144 slices shown]
[im 1/144]
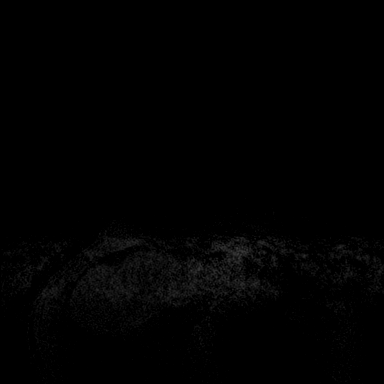
[im 29/144]
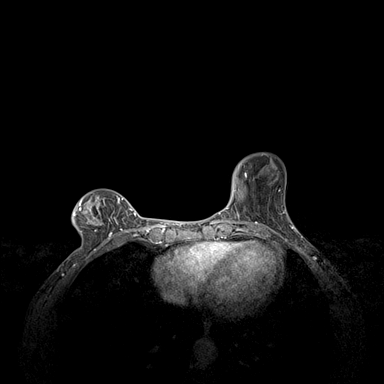
[im 58/144]
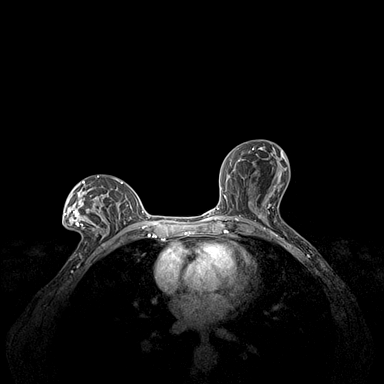
[im 86/144]
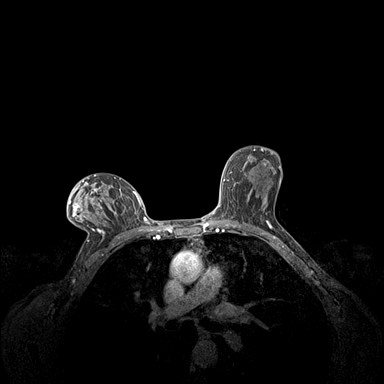
[im 115/144]
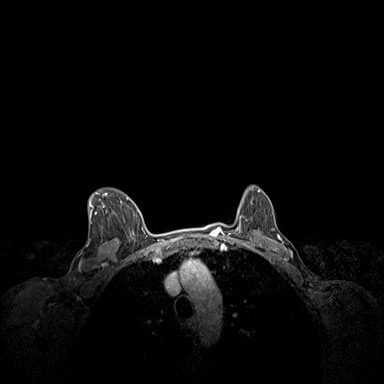
[im 144/144]
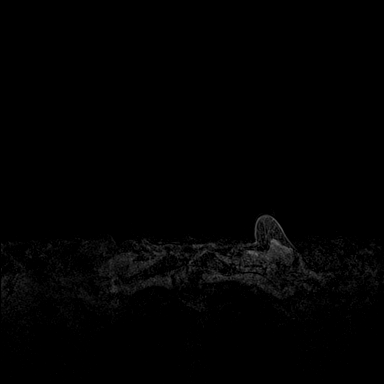

[Series 9: fl3d pre-cm 3min_sub · axial · non-contrast · 1.2mm · 0.89mm/px · z∈[-95,+42]mm · 5 of 144 slices shown]
[im 1/144]
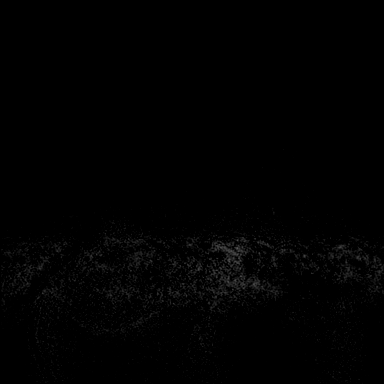
[im 29/144]
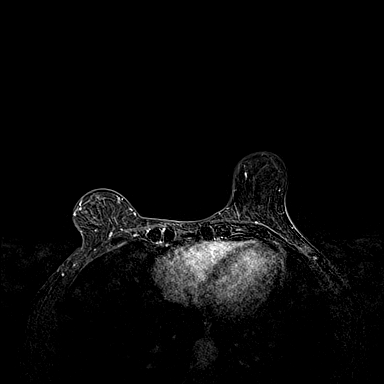
[im 58/144]
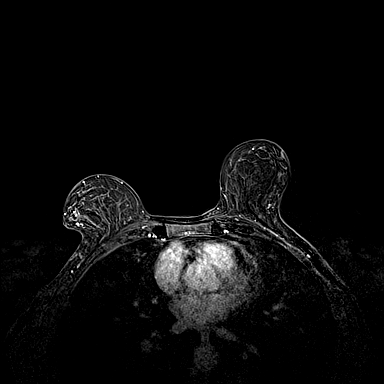
[im 86/144]
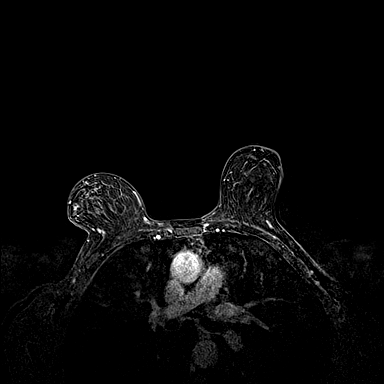
[im 115/144]
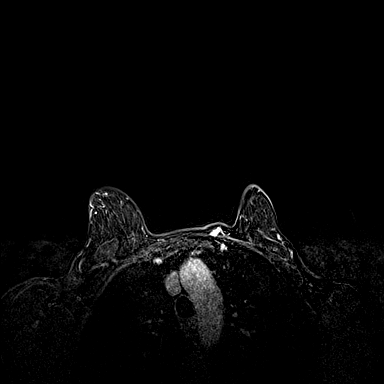

[33 of 48 positions shown; findings below may reference images not displayed]

Three-dimensional MR images were rendered by post-processing of the
original MR data on an independent workstation. The
three-dimensional MR images were interpreted, and findings are
reported in the following complete MRI report for this study. Three
dimensional images were evaluated at the independent interpreting
workstation using the DynaCAD thin client.
FINDINGS: Breast composition: c. Heterogeneous fibroglandular tissue.

Background parenchymal enhancement: Mild

Right breast: Biopsy clip artifact and mild post biopsy changes
within the OUTER RIGHT breast are identified. Scattered cysts are
present. No suspicious mass or enhancement identified.

Left breast: No suspicious mass or abnormal enhancement. Scattered
cysts are present.

Lymph nodes: No abnormal appearing lymph nodes.

Ancillary findings:  None.
IMPRESSION: 1. No evidence of breast malignancy.
2. RIGHT breast biopsy changes and bilateral breast cysts.

RECOMMENDATION:
Bilateral screening mammogram in 4 months to resume annual mammogram
schedule.

BI-RADS CATEGORY  2: Benign.

## 2021-02-27 IMAGING — CT CT CHEST W/O CM
1 series · 15 of 34 positions shown, 19 images · non-contrast
Comparison: [DATE].

CLINICAL DATA: Shortness of breath.

EXAM:
CT CHEST WITHOUT CONTRAST
TECHNIQUE: Multidetector CT imaging of the chest was performed following the
standard protocol without IV contrast.

[Series 2: chest w/(date) · axial · 0.67mm/px · z∈[-312,-10]mm · 15 of 179 slices shown, 19 images]
[im 14/179  mediastinal]
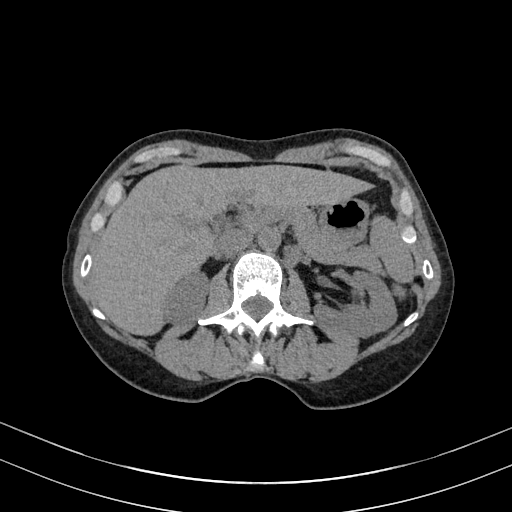
[im 14/179  lung]
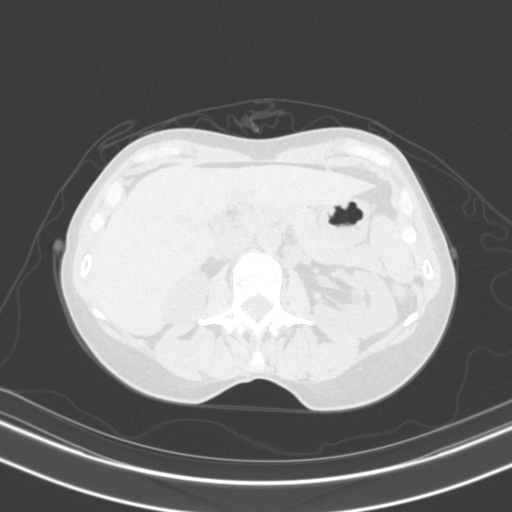
[im 27/179  lung]
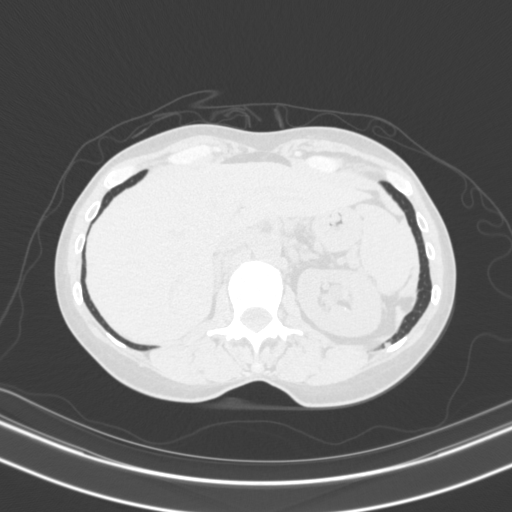
[im 36/179  lung]
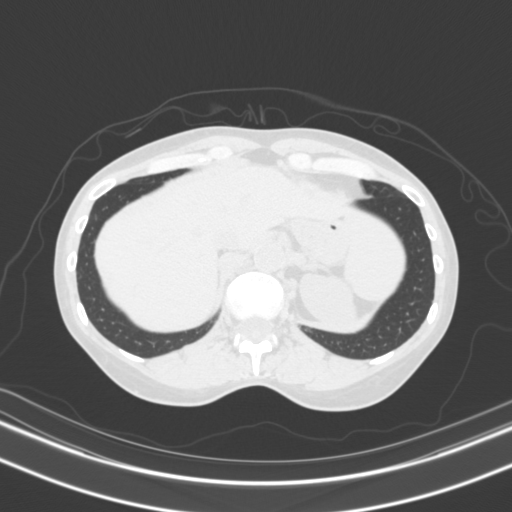
[im 47/179  lung]
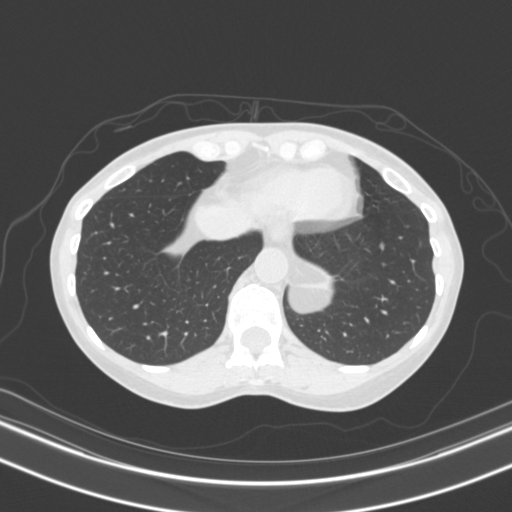
[im 60/179  mediastinal]
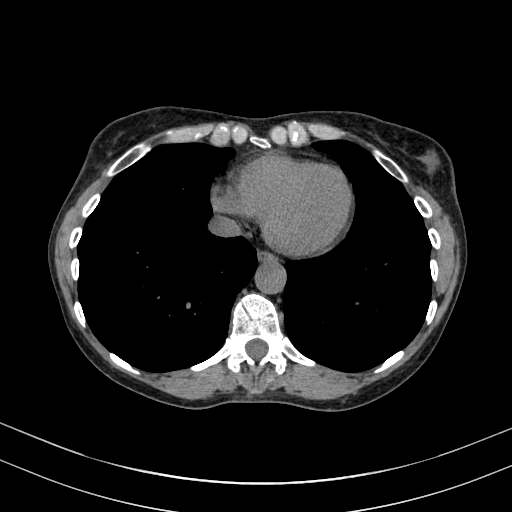
[im 60/179  lung]
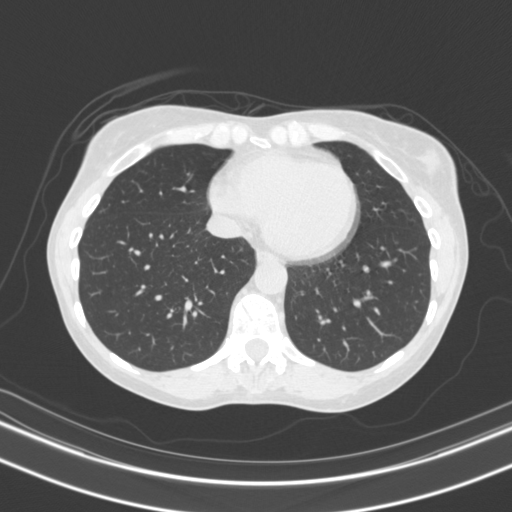
[im 72/179  lung]
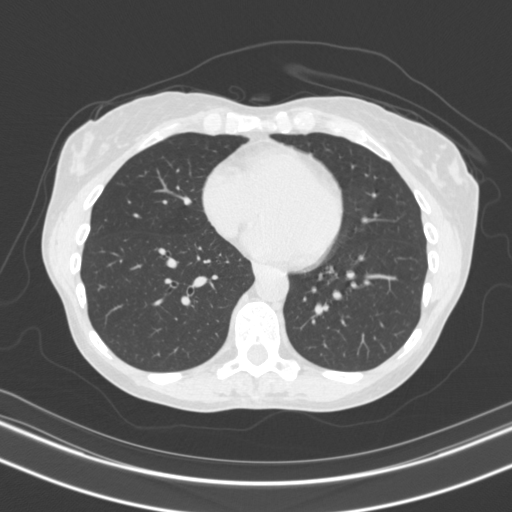
[im 80/179  lung]
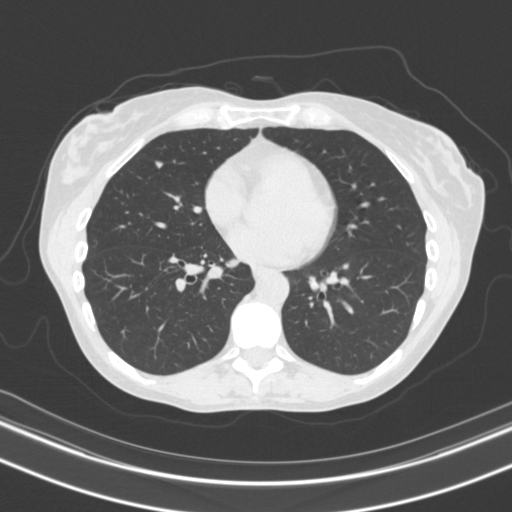
[im 93/179  lung]
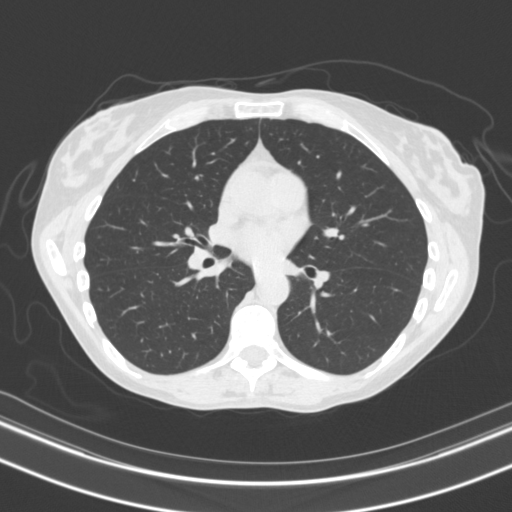
[im 99/179  mediastinal]
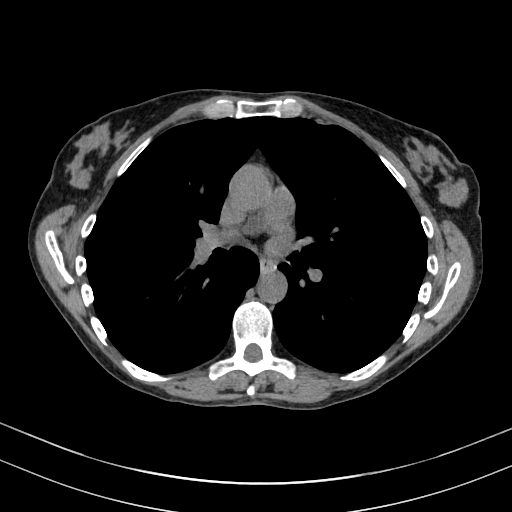
[im 99/179  lung]
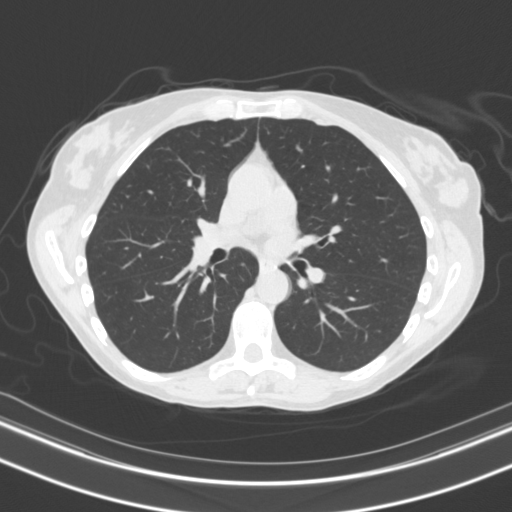
[im 107/179  lung]
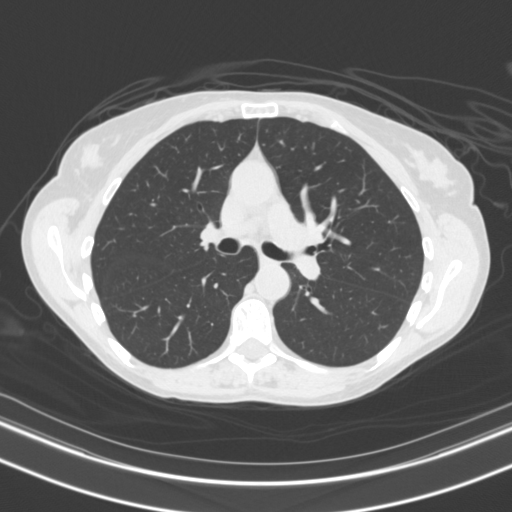
[im 119/179  lung]
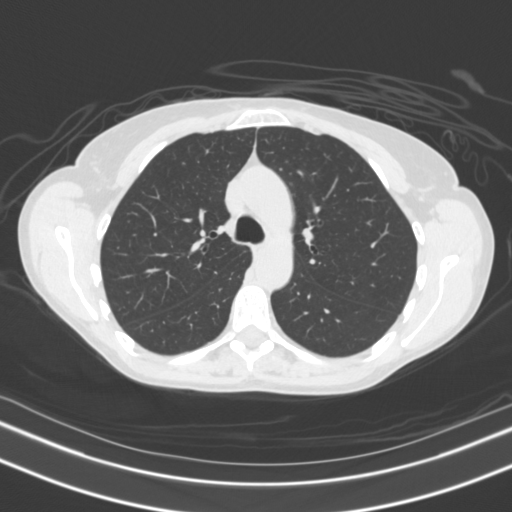
[im 132/179  lung]
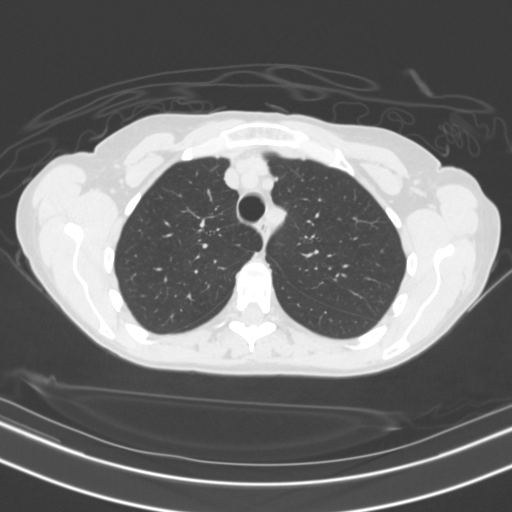
[im 143/179  mediastinal]
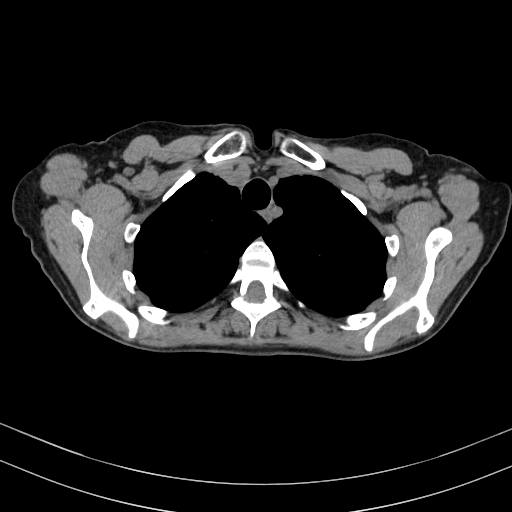
[im 143/179  lung]
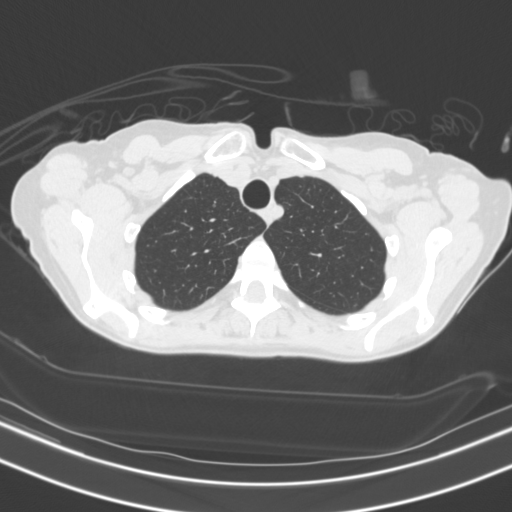
[im 152/179  lung]
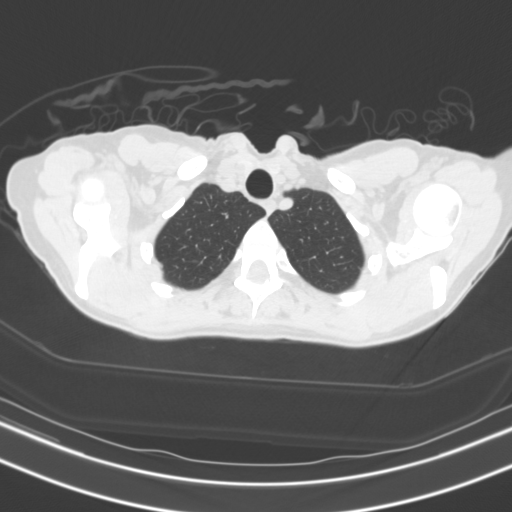
[im 165/179  lung]
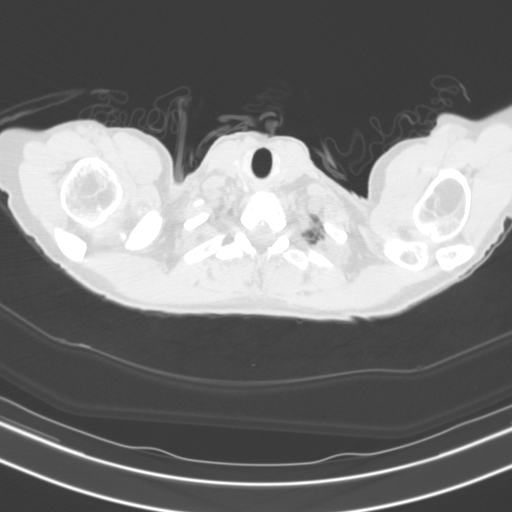

[15 of 34 positions shown; findings below may reference images not displayed]

FINDINGS: Cardiovascular: No significant vascular findings. Normal heart size.
No pericardial effusion.

Mediastinum/Nodes: No enlarged mediastinal or axillary lymph nodes.
Thyroid gland, trachea, and esophagus demonstrate no significant
findings.

Lungs/Pleura: No pneumothorax or pleural effusion is noted. 5 mm
nodule is noted in left costophrenic sulcus best seen on image
number 153 of series 3. 5 mm nodule seen in right upper lobe best
seen on image number 100 of series 3. No consolidative process is
noted.

Upper Abdomen: Probable bilateral nephrolithiasis.

Musculoskeletal: No chest wall mass or suspicious bone lesions
identified.
IMPRESSION: Bilateral pulmonary nodules are noted, each measuring approximately
5 mm. No follow-up needed if patient is low-risk (and has no known
or suspected primary neoplasm). Non-contrast chest CT can be
considered in 12 months if patient is high-risk. This recommendation
follows the consensus statement: Guidelines for Management of
Incidental Pulmonary Nodules Detected on CT Images: From the

Probable bilateral nephrolithiasis.

## 2021-02-27 MED ORDER — GADOBUTROL 1 MMOL/ML IV SOLN
5.0000 mL | Freq: Once | INTRAVENOUS | Status: AC | PRN
  Administered 2021-02-27: 5 mL via INTRAVENOUS

## 2021-03-05 ENCOUNTER — Ambulatory Visit (HOSPITAL_BASED_OUTPATIENT_CLINIC_OR_DEPARTMENT_OTHER)
Admission: RE | Admit: 2021-03-05 | Discharge: 2021-03-05 | Disposition: A | Discharge status: Home / Self Care | Payer: BC Managed Care – PPO | Source: Ambulatory Visit | Attending: Family Medicine | Admitting: Family Medicine

## 2021-03-05 ENCOUNTER — Other Ambulatory Visit: Payer: Self-pay

## 2021-03-05 DIAGNOSIS — H53143 Visual discomfort, bilateral: Secondary | ICD-10-CM | POA: Diagnosis not present

## 2021-03-05 DIAGNOSIS — E7800 Pure hypercholesterolemia, unspecified: Secondary | ICD-10-CM | POA: Insufficient documentation

## 2021-03-05 IMAGING — CT CT CARDIAC CORONARY ARTERY CALCIUM SCORE
3 series · 14 of 20 positions shown, 16 images · non-contrast
Comparison: [DATE]
COMPARISON: [DATE]

Addendum:
EXAM:
OVER-READ INTERPRETATION  CT CHEST

The following report is an over-read performed by radiologist Dr.
GASPAR [REDACTED] on [DATE]. This
over-read does not include interpretation of cardiac or coronary
anatomy or pathology. The coronary calcium score interpretation by
the cardiologist is attached.
CLINICAL DATA: Risk stratification
Coronary Calcium Score
TECHNIQUE: The patient was scanned on a Siemens Somatom 64 slice scanner. Axial
non-contrast 3 mm slices were carried out through the heart. The
data set was analyzed on a dedicated work station and scored using
the Agatson method.

[Series 3: ax lung · axial · 0.62mm/px · z∈[+96,+186]mm · 5 of 69 slices shown]
[im 12/69  lung]
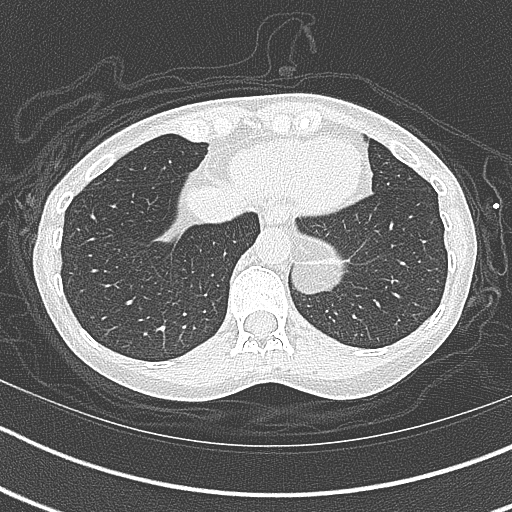
[im 23/69  lung]
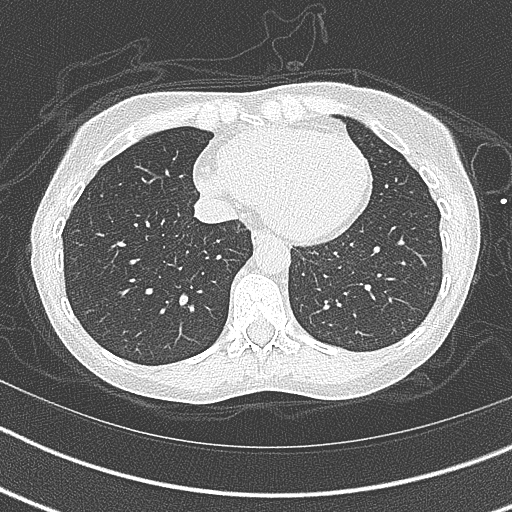
[im 35/69  lung]
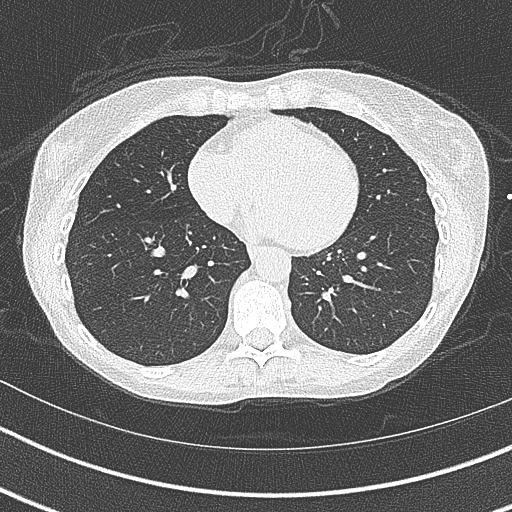
[im 46/69  lung]
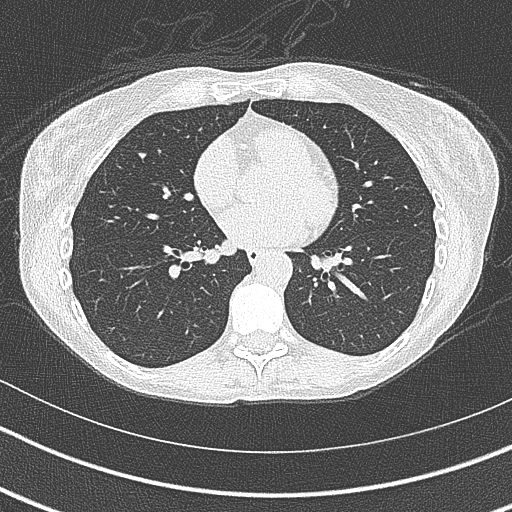
[im 57/69  lung]
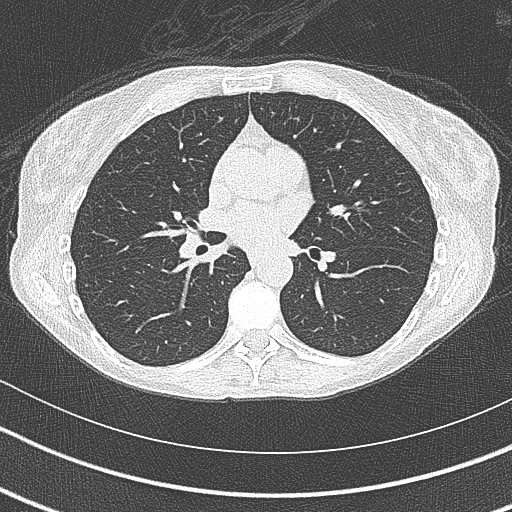

[Series 4: cascseq 3.0 sa36 70% (id) · axial · 0.39mm/px · z∈[+107,+173]mm · 3 of 46 slices shown]
[im 12/46  vessel]
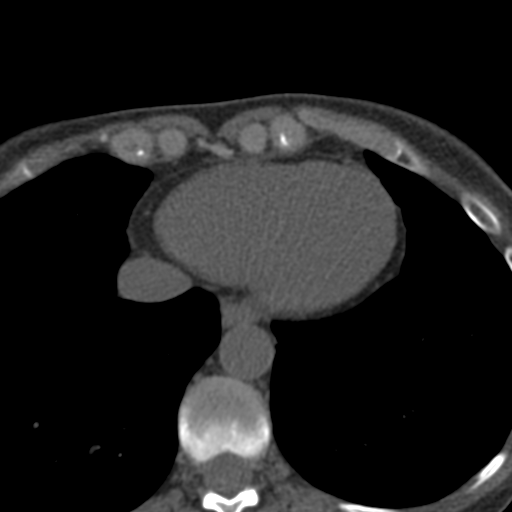
[im 23/46  vessel]
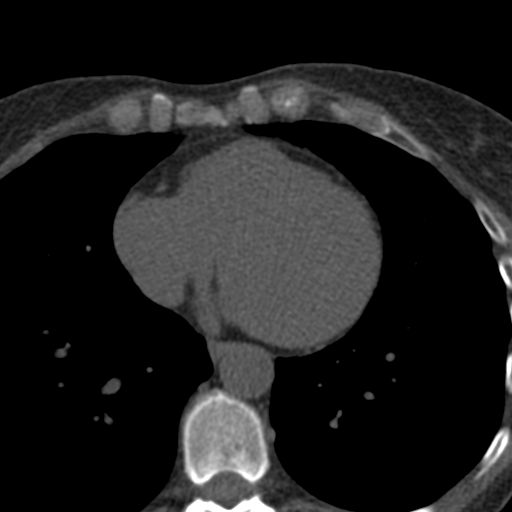
[im 34/46  vessel]
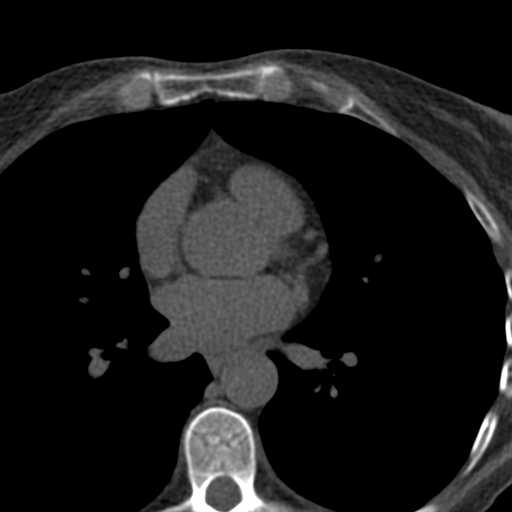

[Series 5: ax st · axial · 0.62mm/px · z∈[+92,+190]mm · 6 of 69 slices shown, 8 images]
[im 10/69  vessel]
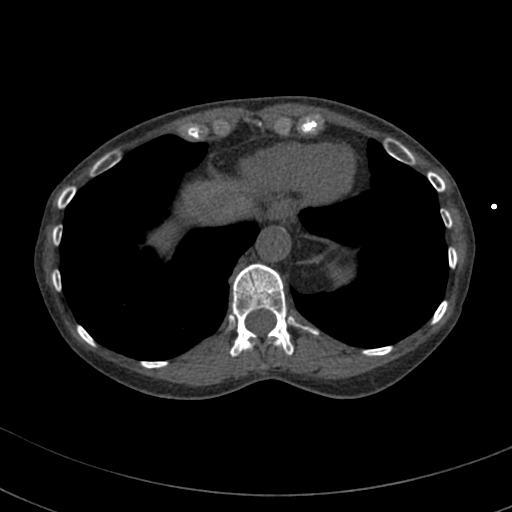
[im 10/69  lung]
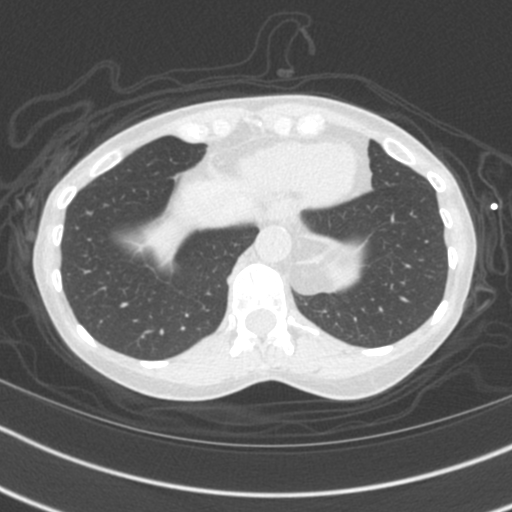
[im 20/69  vessel]
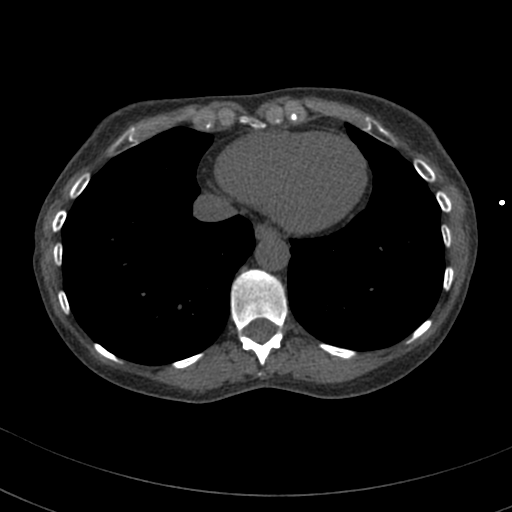
[im 30/69  vessel]
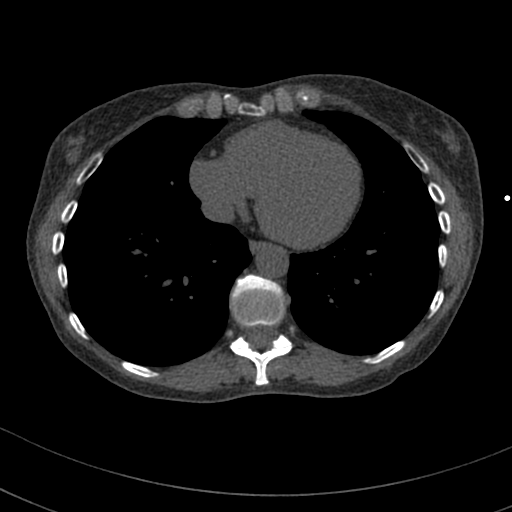
[im 39/69  vessel]
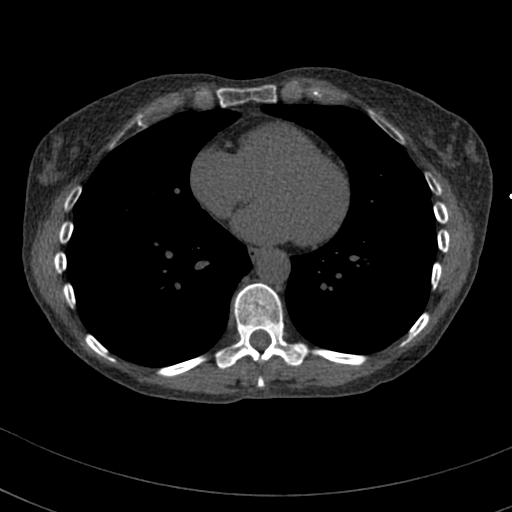
[im 49/69  vessel]
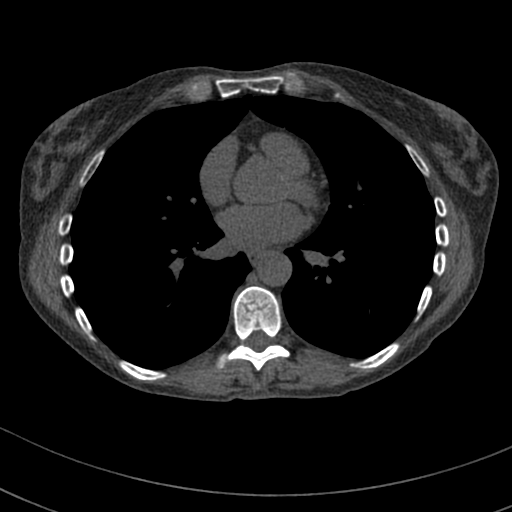
[im 49/69  lung]
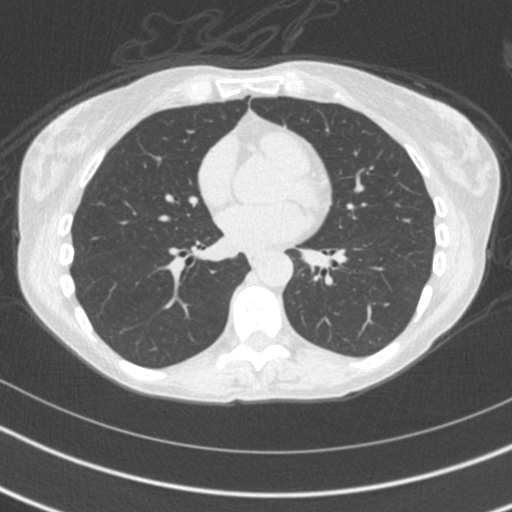
[im 59/69  vessel]
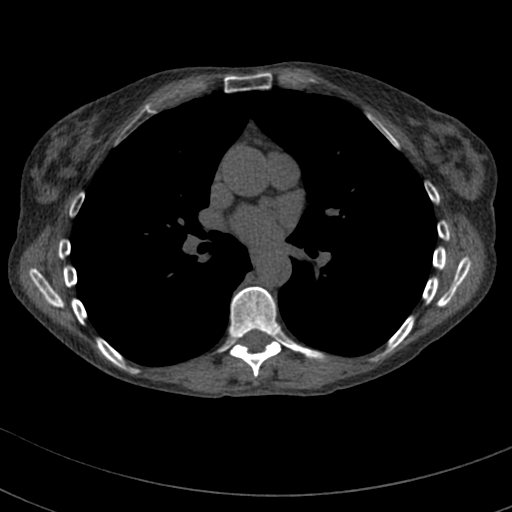

[14 of 20 positions shown; findings below may reference images not displayed]

FINDINGS: Vascular: Heart is normal size.  Aorta normal caliber.

Mediastinum/Nodes: No adenopathy

Lungs/Pleura: No confluent opacities or effusions.

Upper Abdomen: Imaging into the upper abdomen demonstrates no acute
findings.

Musculoskeletal: Chest wall soft tissues are unremarkable. No acute
bony abnormality.
IMPRESSION: No acute or significant extracardiac abnormality.
FINDINGS: Non-cardiac: See separate report from [REDACTED].

Ascending aorta: Normal diameter 2.8 cm

Pericardium: Normal

Coronary arteries: No calcium noted
IMPRESSION: Coronary calcium score of 0.

GASPAR

*** End of Addendum ***
EXAM:
OVER-READ INTERPRETATION  CT CHEST

The following report is an over-read performed by radiologist Dr.
GASPAR [REDACTED] on [DATE]. This
over-read does not include interpretation of cardiac or coronary
anatomy or pathology. The coronary calcium score interpretation by
the cardiologist is attached.
FINDINGS: Vascular: Heart is normal size.  Aorta normal caliber.

Mediastinum/Nodes: No adenopathy

Lungs/Pleura: No confluent opacities or effusions.

Upper Abdomen: Imaging into the upper abdomen demonstrates no acute
findings.

Musculoskeletal: Chest wall soft tissues are unremarkable. No acute
bony abnormality.
IMPRESSION: No acute or significant extracardiac abnormality.

## 2021-03-08 ENCOUNTER — Ambulatory Visit (INDEPENDENT_AMBULATORY_CARE_PROVIDER_SITE_OTHER): Payer: BC Managed Care – PPO | Admitting: Pulmonary Disease

## 2021-03-08 ENCOUNTER — Other Ambulatory Visit: Payer: Self-pay

## 2021-03-08 ENCOUNTER — Encounter: Payer: Self-pay | Admitting: Pulmonary Disease

## 2021-03-08 VITALS — BP 118/70 | HR 63 | Ht 65.0 in | Wt 115.4 lb

## 2021-03-08 DIAGNOSIS — R053 Chronic cough: Secondary | ICD-10-CM | POA: Diagnosis not present

## 2021-03-08 LAB — CBC WITH DIFFERENTIAL/PLATELET
Basophils Absolute: 0 10*3/uL (ref 0.0–0.1)
Basophils Relative: 1.2 % (ref 0.0–3.0)
Eosinophils Absolute: 0.1 10*3/uL (ref 0.0–0.7)
Eosinophils Relative: 2.4 % (ref 0.0–5.0)
HCT: 39.3 % (ref 36.0–46.0)
Hemoglobin: 12.8 g/dL (ref 12.0–15.0)
Lymphocytes Relative: 41.9 % (ref 12.0–46.0)
Lymphs Abs: 1.3 10*3/uL (ref 0.7–4.0)
MCHC: 32.5 g/dL (ref 30.0–36.0)
MCV: 95.3 fl (ref 78.0–100.0)
Monocytes Absolute: 0.2 10*3/uL (ref 0.1–1.0)
Monocytes Relative: 7.4 % (ref 3.0–12.0)
Neutro Abs: 1.5 10*3/uL (ref 1.4–7.7)
Neutrophils Relative %: 47.1 % (ref 43.0–77.0)
Platelets: 148 10*3/uL — ABNORMAL LOW (ref 150.0–400.0)
RBC: 4.12 Mil/uL (ref 3.87–5.11)
RDW: 12.5 % (ref 11.5–15.5)
WBC: 3.2 10*3/uL — ABNORMAL LOW (ref 4.0–10.5)

## 2021-03-08 MED ORDER — OMEPRAZOLE 40 MG PO CPDR: 40.0000 mg | DELAYED_RELEASE_CAPSULE | Freq: Every day | ORAL | 5 refills | Status: DC

## 2021-03-08 MED ORDER — ADVAIR HFA 230-21 MCG/ACT IN AERO: 2.0000 | INHALATION_SPRAY | Freq: Two times a day (BID) | RESPIRATORY_TRACT | 6 refills | Status: AC

## 2021-03-08 NOTE — Patient Instructions (Signed)
Chronic cough Chronic Bronchitis Shortness of breath --INCREASE Advair HFA 230-21 mcg TWO puffs TWICE a day. Use with a spacer --CONTINUE cetirizine 10 mg daily --START omeprazole 40 mg daily --Obtain labs: CBC with diff, IgE --Will trial the plan above and if no results, plan for bronchoscopy with BAL  Follow-up with in 4-8 weeks
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
## 2021-03-11 LAB — IGE: IgE (Immunoglobulin E), Serum: 43 kU/L (ref <=114)

## 2021-03-12 ENCOUNTER — Other Ambulatory Visit (HOSPITAL_COMMUNITY): Payer: Self-pay

## 2021-03-12 ENCOUNTER — Telehealth: Payer: Self-pay | Admitting: Pharmacy Technician

## 2021-03-12 NOTE — Telephone Encounter (Signed)
Received a fax regarding Prior Authorization from [employer] for OMEPRAZOLE. Authorization has been DENIED because PRODUCT SERVICE NOT COVERED. MUST USE OTC ALTERNATIVE.

## 2021-03-29 ENCOUNTER — Other Ambulatory Visit: Payer: Self-pay

## 2021-03-29 ENCOUNTER — Ambulatory Visit (INDEPENDENT_AMBULATORY_CARE_PROVIDER_SITE_OTHER): Payer: BC Managed Care – PPO | Admitting: Pulmonary Disease

## 2021-03-29 ENCOUNTER — Encounter: Payer: Self-pay | Admitting: Pulmonary Disease

## 2021-03-29 VITALS — BP 124/70 | HR 71 | Temp 98.1°F

## 2021-03-29 DIAGNOSIS — R053 Chronic cough: Secondary | ICD-10-CM | POA: Diagnosis not present

## 2021-03-29 DIAGNOSIS — D72818 Other decreased white blood cell count: Secondary | ICD-10-CM | POA: Diagnosis not present

## 2021-03-29 DIAGNOSIS — M150 Primary generalized (osteo)arthritis: Secondary | ICD-10-CM | POA: Diagnosis not present

## 2021-03-29 DIAGNOSIS — I7300 Raynaud's syndrome without gangrene: Secondary | ICD-10-CM | POA: Diagnosis not present

## 2021-03-29 DIAGNOSIS — M359 Systemic involvement of connective tissue, unspecified: Secondary | ICD-10-CM | POA: Diagnosis not present
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
## 2021-03-29 NOTE — Patient Instructions (Signed)
Chronic cough --CUT back on Advair to ONCE daily for one week to help hoarseness --CONTINUE Advair HFA 230-21 mcg TWO puffs TWICE a day. Use with a spacer --CONTINUE cetirizine 10 mg daily --CONTINUE omeprazole 40 mg daily until Thanksgiving  Follow-up with me in 3 months

## 2021-04-08 ENCOUNTER — Ambulatory Visit: Payer: BC Managed Care – PPO | Admitting: Pulmonary Disease

## 2021-05-03 DIAGNOSIS — L821 Other seborrheic keratosis: Secondary | ICD-10-CM | POA: Diagnosis not present

## 2021-05-03 DIAGNOSIS — Z1283 Encounter for screening for malignant neoplasm of skin: Secondary | ICD-10-CM | POA: Diagnosis not present

## 2021-05-17 DIAGNOSIS — G43009 Migraine without aura, not intractable, without status migrainosus: Secondary | ICD-10-CM | POA: Diagnosis not present

## 2021-05-17 DIAGNOSIS — G47419 Narcolepsy without cataplexy: Secondary | ICD-10-CM | POA: Diagnosis not present

## 2021-05-17 DIAGNOSIS — G4719 Other hypersomnia: Secondary | ICD-10-CM | POA: Diagnosis not present

## 2021-05-28 ENCOUNTER — Other Ambulatory Visit: Payer: Self-pay | Admitting: Family Medicine

## 2021-05-28 DIAGNOSIS — Z1231 Encounter for screening mammogram for malignant neoplasm of breast: Secondary | ICD-10-CM

## 2021-06-14 DIAGNOSIS — K649 Unspecified hemorrhoids: Secondary | ICD-10-CM | POA: Diagnosis not present

## 2021-06-14 DIAGNOSIS — R300 Dysuria: Secondary | ICD-10-CM | POA: Diagnosis not present

## 2021-06-14 DIAGNOSIS — R102 Pelvic and perineal pain: Secondary | ICD-10-CM | POA: Diagnosis not present

## 2021-07-04 ENCOUNTER — Ambulatory Visit: Payer: BC Managed Care – PPO

## 2021-07-04 ENCOUNTER — Ambulatory Visit
Admission: RE | Admit: 2021-07-04 | Discharge: 2021-07-04 | Disposition: A | Discharge status: Home / Self Care | Payer: BC Managed Care – PPO | Source: Ambulatory Visit | Attending: Family Medicine | Admitting: Family Medicine

## 2021-07-04 ENCOUNTER — Other Ambulatory Visit: Payer: Self-pay

## 2021-07-04 DIAGNOSIS — Z1231 Encounter for screening mammogram for malignant neoplasm of breast: Secondary | ICD-10-CM

## 2021-07-04 IMAGING — MG MM DIGITAL SCREENING BILAT W/ TOMO AND CAD
8 series · 9 of 24 positions shown · non-contrast
Comparison: Previous exam(s).

CLINICAL DATA: Screening.

EXAM:
DIGITAL SCREENING BILATERAL MAMMOGRAM WITH TOMOSYNTHESIS AND CAD
TECHNIQUE: Bilateral screening digital craniocaudal and mediolateral oblique
mammograms were obtained. Bilateral screening digital breast
tomosynthesis was performed. The images were evaluated with
computer-aided detection.

[L CC synth-2D]
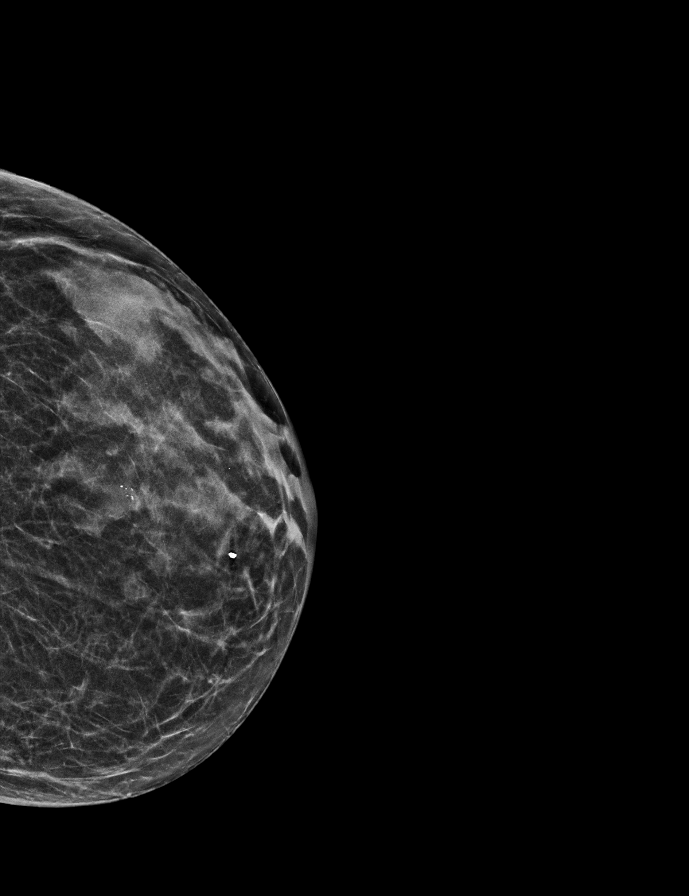

[R CC synth-2D]
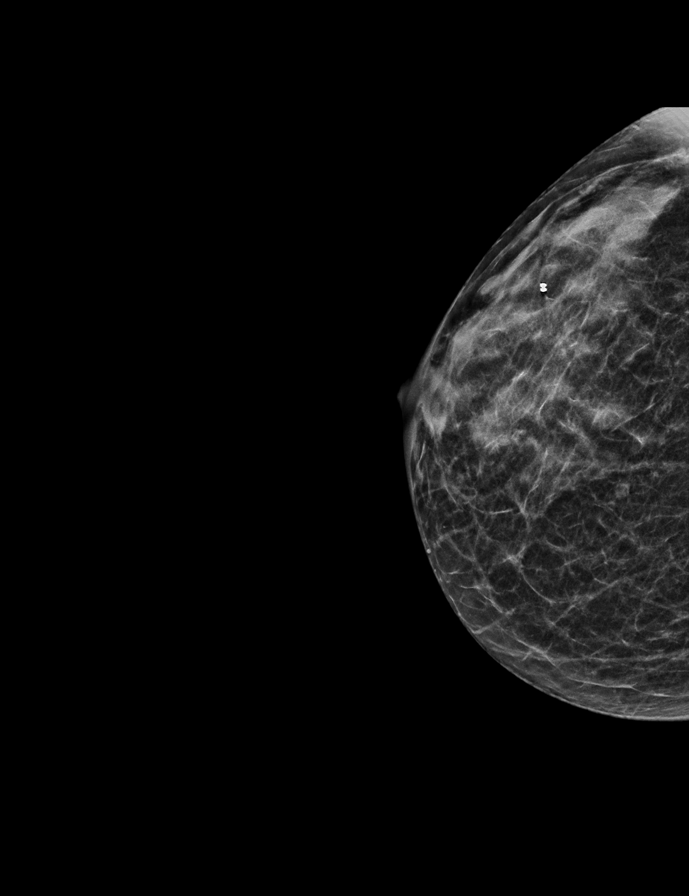

[R MLO synth-2D]
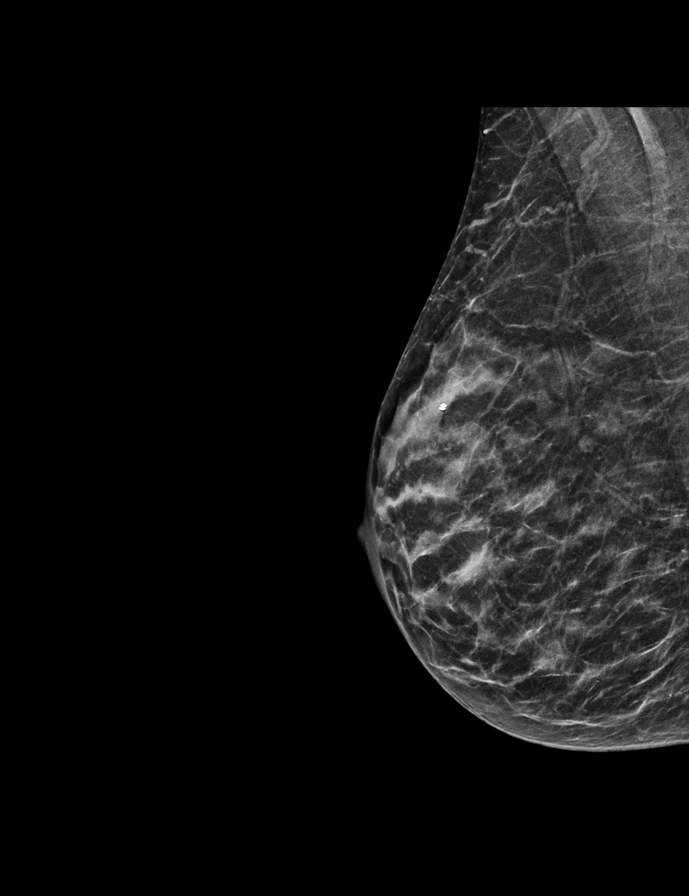

[L MLO synth-2D]
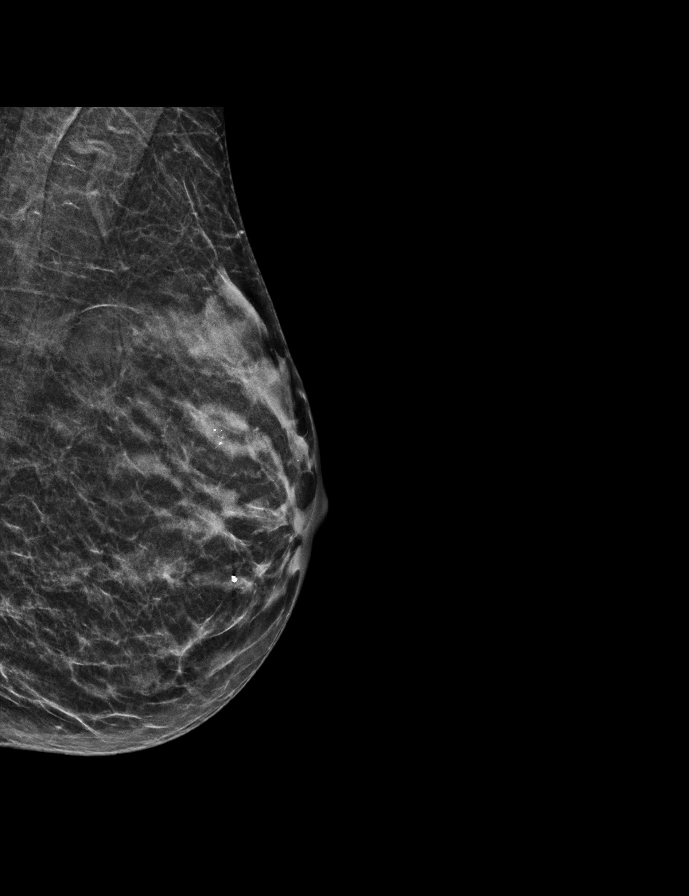

[R MLO tomo · 2 of 38 frames shown]
[frame 13/38]
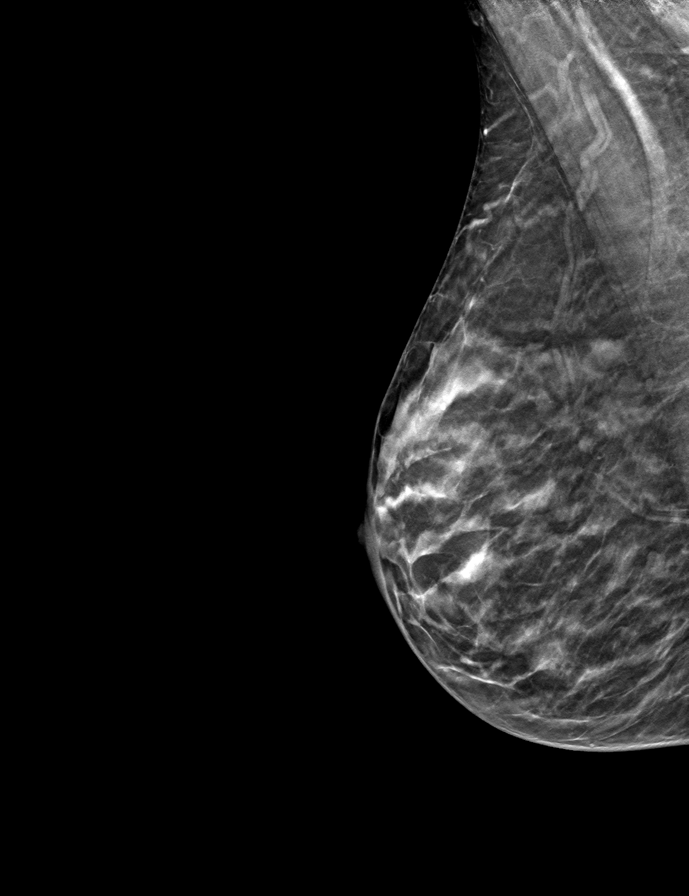
[frame 19/38]
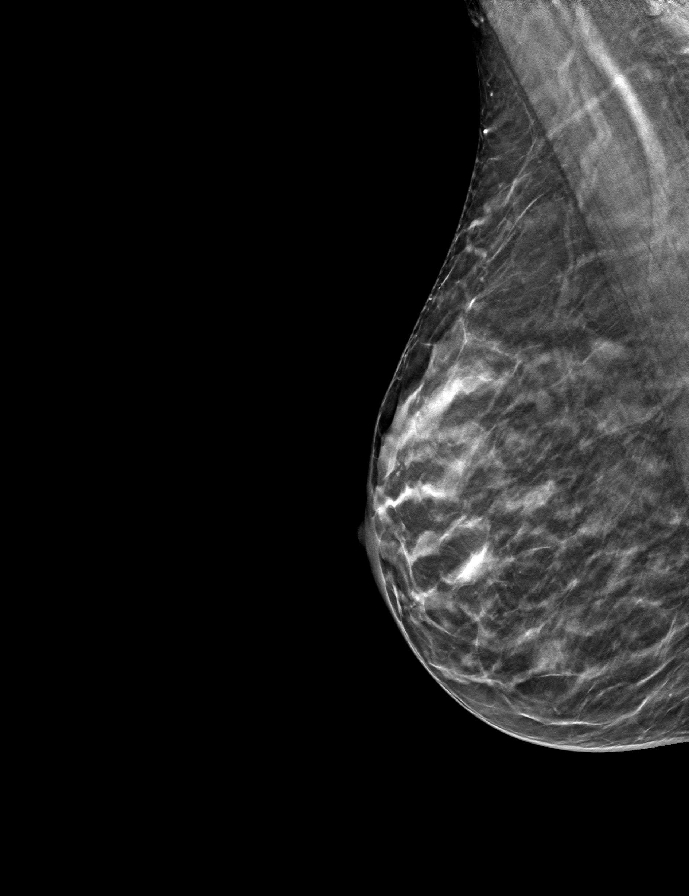

[L MLO tomo · tomo slice 19/37.0]
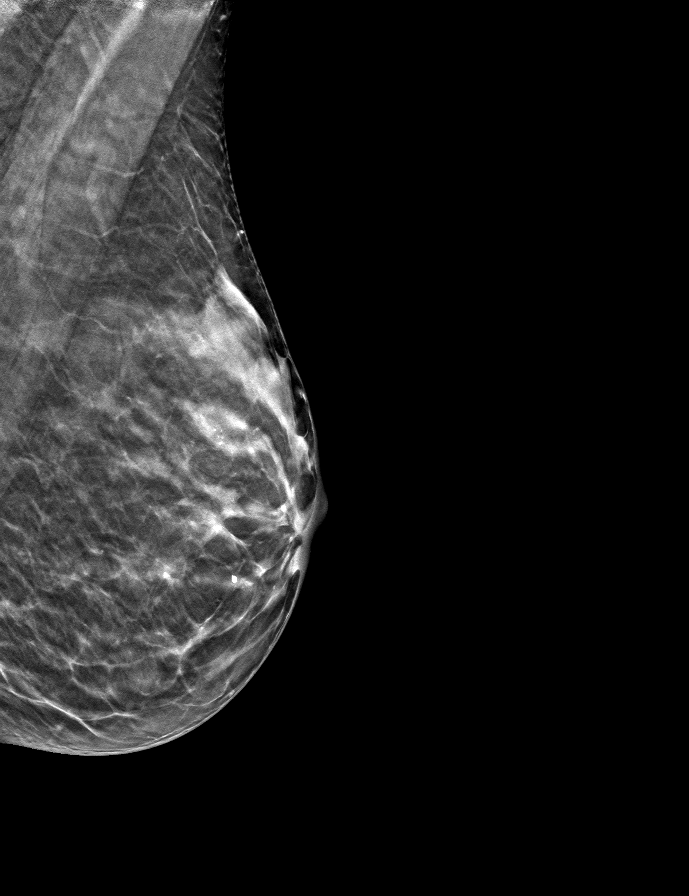

[L CC tomo · tomo slice 20/39.0]
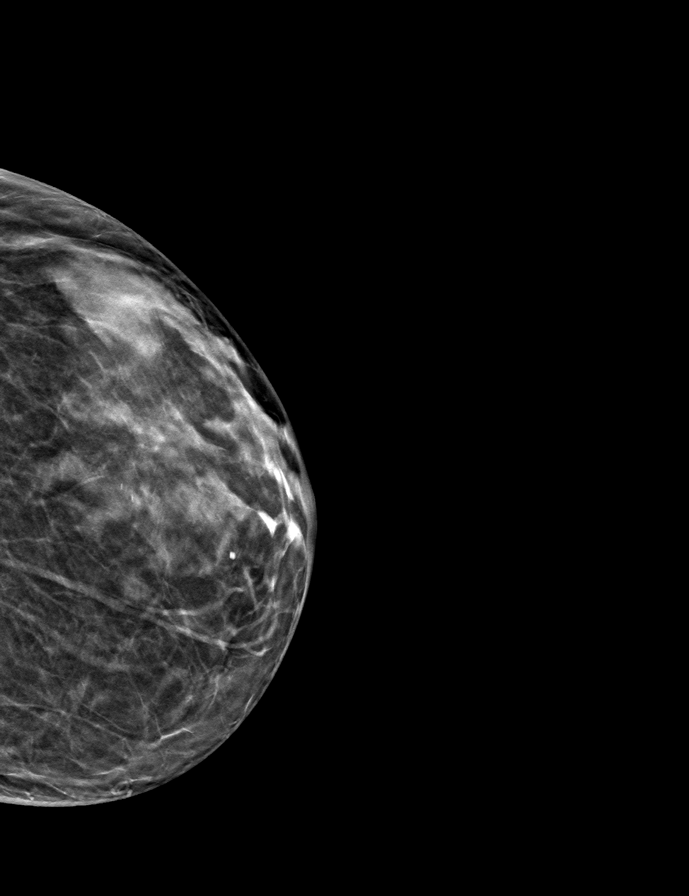

[R CC tomo · tomo slice 19/36.0]
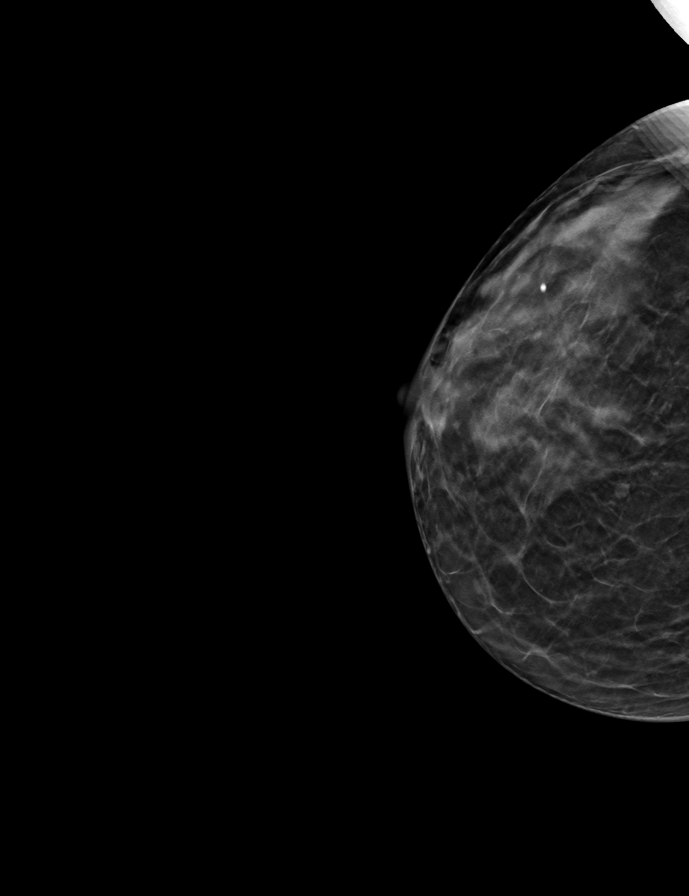

[9 of 24 positions shown; findings below may reference images not displayed]

ACR Breast Density Category c: The breast tissue is heterogeneously
dense, which may obscure small masses.
FINDINGS: In the left breast, calcifications warrant further evaluation. In
the right breast, no findings suspicious for malignancy.
IMPRESSION: Further evaluation is suggested for calcifications in the left
breast.

RECOMMENDATION:
Diagnostic mammogram of the left breast. (Code:[KD])

The patient will be contacted regarding the findings, and additional
imaging will be scheduled.

BI-RADS CATEGORY  0: Incomplete. Need additional imaging evaluation
and/or prior mammograms for comparison.

## 2021-07-05 ENCOUNTER — Other Ambulatory Visit: Payer: Self-pay | Admitting: Family Medicine

## 2021-07-05 DIAGNOSIS — R928 Other abnormal and inconclusive findings on diagnostic imaging of breast: Secondary | ICD-10-CM

## 2021-07-12 ENCOUNTER — Other Ambulatory Visit: Payer: Self-pay | Admitting: Family Medicine

## 2021-07-12 ENCOUNTER — Other Ambulatory Visit: Payer: Self-pay

## 2021-07-12 ENCOUNTER — Ambulatory Visit
Admission: RE | Admit: 2021-07-12 | Discharge: 2021-07-12 | Disposition: A | Discharge status: Home / Self Care | Payer: BC Managed Care – PPO | Source: Ambulatory Visit | Attending: Family Medicine | Admitting: Family Medicine

## 2021-07-12 DIAGNOSIS — R922 Inconclusive mammogram: Secondary | ICD-10-CM | POA: Diagnosis not present

## 2021-07-12 DIAGNOSIS — R928 Other abnormal and inconclusive findings on diagnostic imaging of breast: Secondary | ICD-10-CM

## 2021-07-12 DIAGNOSIS — R921 Mammographic calcification found on diagnostic imaging of breast: Secondary | ICD-10-CM | POA: Diagnosis not present

## 2021-07-12 IMAGING — MG DIGITAL DIAGNOSTIC UNILAT LEFT W/ CAD
3 series · 3 of 3 positions shown · non-contrast
Comparison: Previous exam(s).

CLINICAL DATA: 60-year-old female presenting for recall from
screening mammography for left breast calcifications. She has family
history of breast cancer in her mother.

EXAM:
DIGITAL DIAGNOSTIC UNILATERAL LEFT MAMMOGRAM WITH CAD
TECHNIQUE: Left digital diagnostic mammography was performed. Mammographic
images were processed with CAD.

[L CC]
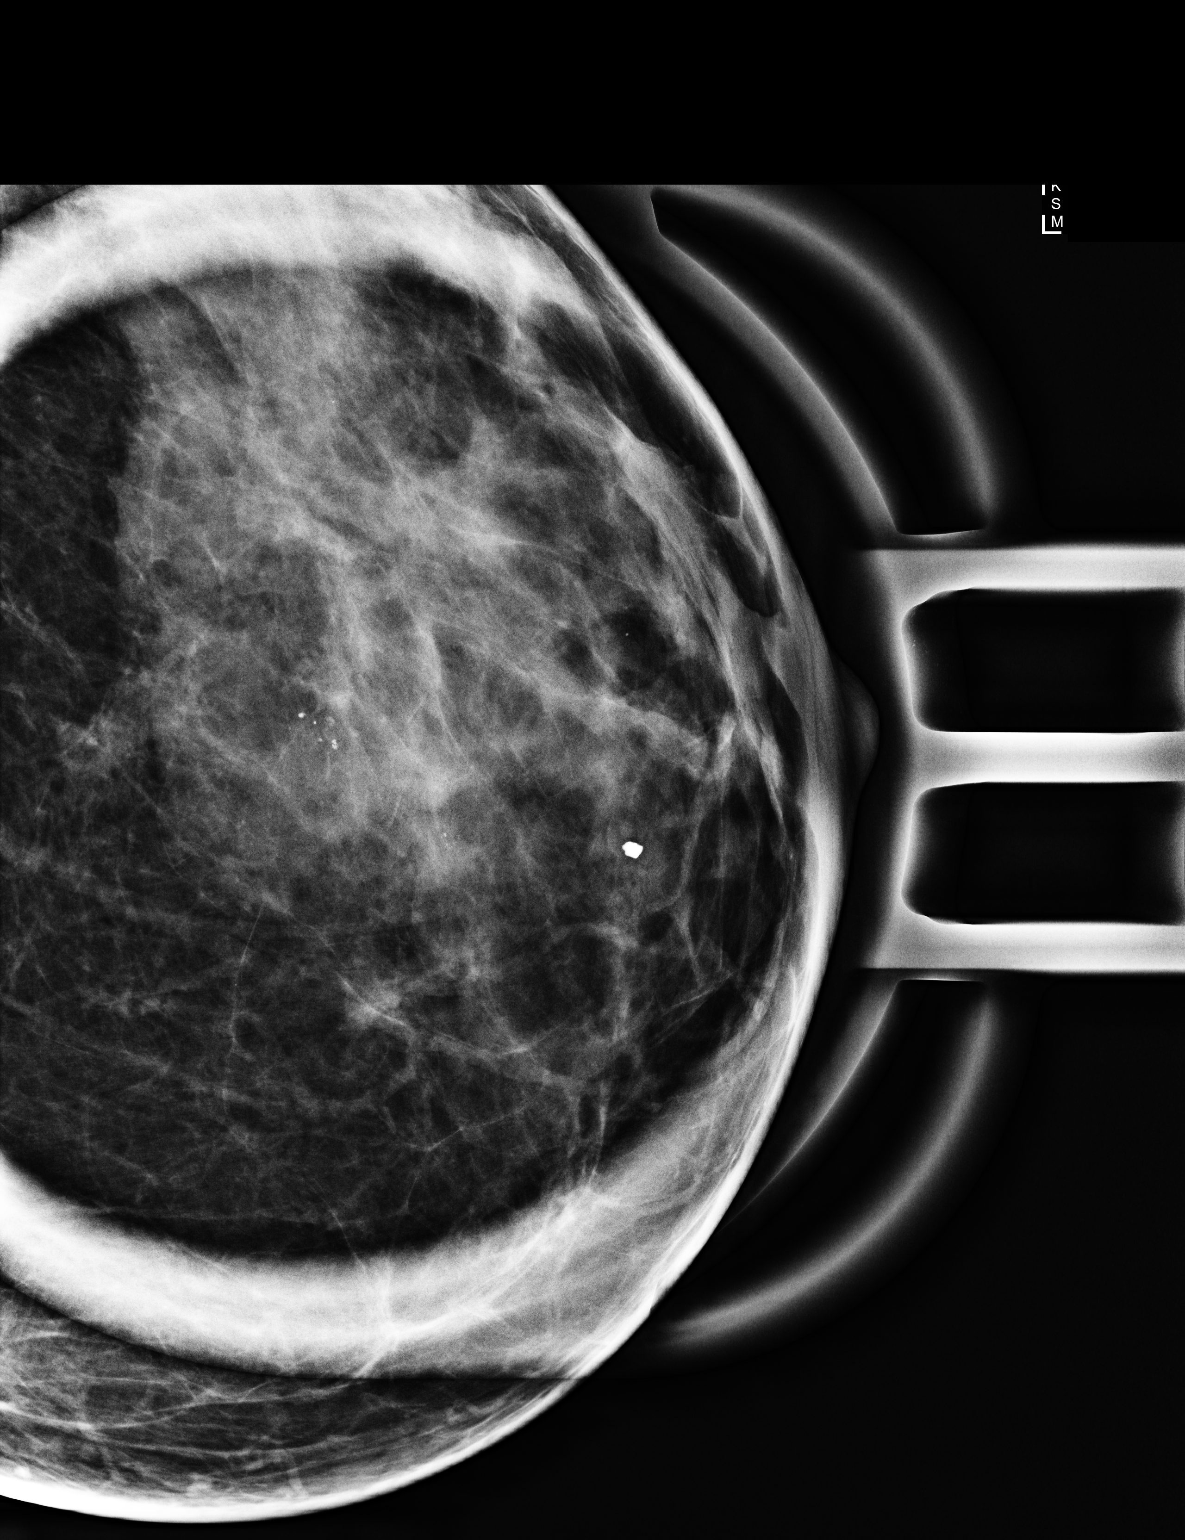

[L ML (1 of 2)]
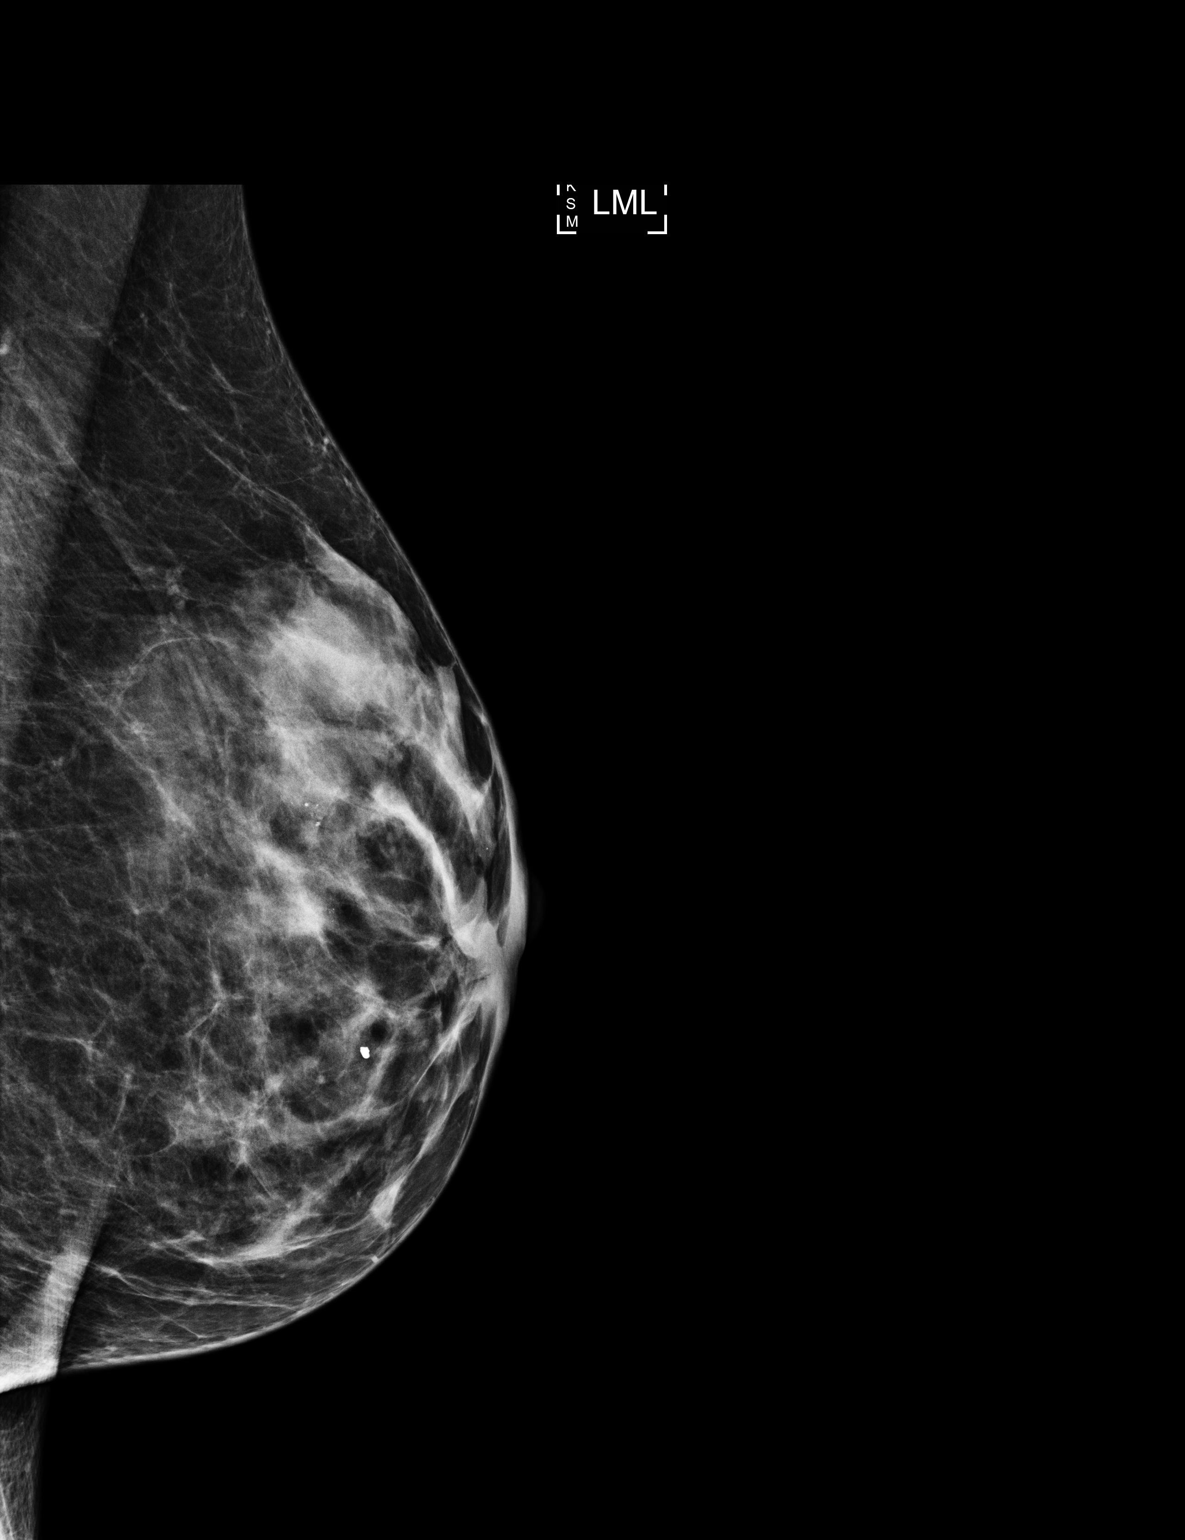

[L ML (2 of 2)]
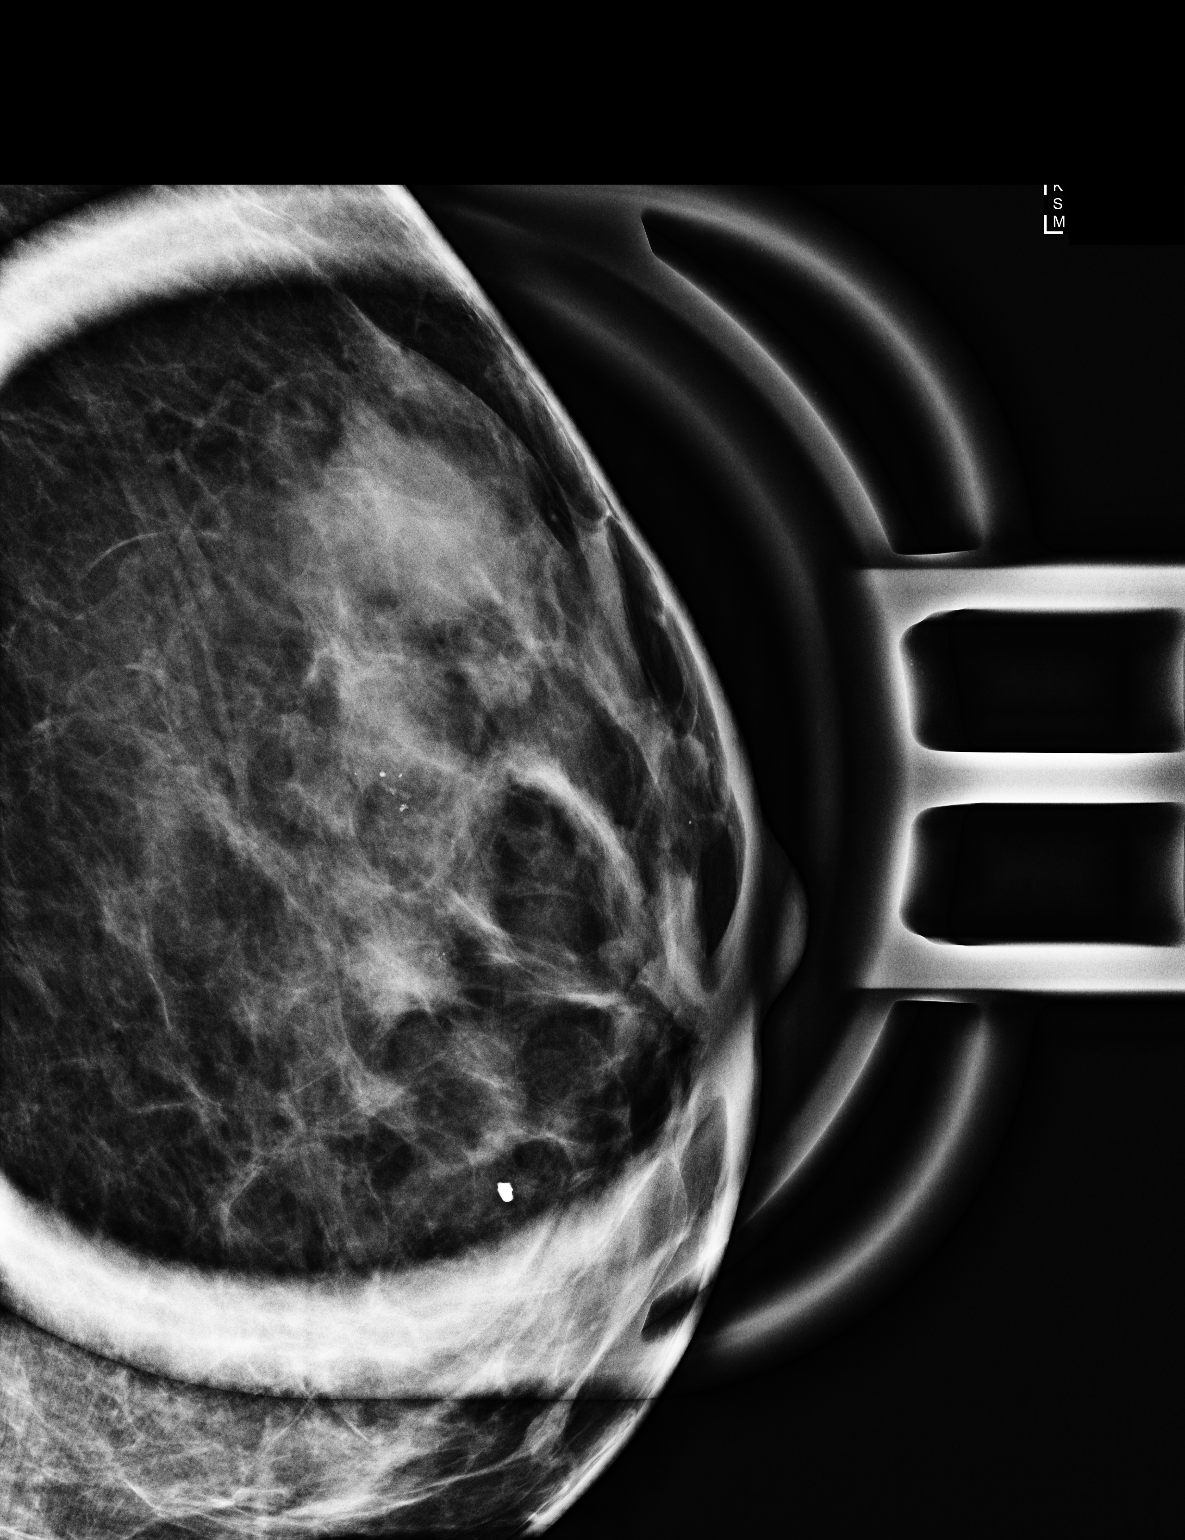

[3 of 3 positions shown; findings below may reference images not displayed]

ACR Breast Density Category c: The breast tissue is heterogeneously
dense, which may obscure small masses.
FINDINGS: In the superior left breast, middle depth there is a 5 mm group
punctate calcifications.
IMPRESSION: Indeterminate left breast calcifications.

RECOMMENDATION:
Stereotactic biopsy is recommended for the left breast
calcifications. The procedure has scheduled for [DATE] at [DATE]
a.m.

I have discussed the findings and recommendations with the patient.
If applicable, a reminder letter will be sent to the patient
regarding the next appointment.

BI-RADS CATEGORY  4: Suspicious.

## 2021-07-18 ENCOUNTER — Other Ambulatory Visit: Payer: Self-pay | Admitting: Family Medicine

## 2021-07-18 ENCOUNTER — Ambulatory Visit
Admission: RE | Admit: 2021-07-18 | Discharge: 2021-07-18 | Disposition: A | Discharge status: Home / Self Care | Payer: BC Managed Care – PPO | Source: Ambulatory Visit | Attending: Family Medicine | Admitting: Family Medicine

## 2021-07-18 DIAGNOSIS — R921 Mammographic calcification found on diagnostic imaging of breast: Secondary | ICD-10-CM | POA: Diagnosis not present

## 2021-07-18 DIAGNOSIS — N6012 Diffuse cystic mastopathy of left breast: Secondary | ICD-10-CM | POA: Diagnosis not present

## 2021-07-18 DIAGNOSIS — R928 Other abnormal and inconclusive findings on diagnostic imaging of breast: Secondary | ICD-10-CM

## 2021-07-18 IMAGING — MG MM BREAST LOCALIZATION CLIP
4 series · 4 of 12 positions shown · non-contrast
Comparison: Previous exam(s).
COMPARISON: Previous exam(s).

Addendum:
CLINICAL DATA: Status post stereotactic guided biopsy left breast
calcifications.

EXAM:
3D DIAGNOSTIC LEFT MAMMOGRAM POST STEREOTACTIC BIOPSY

[L ML synth-2D]
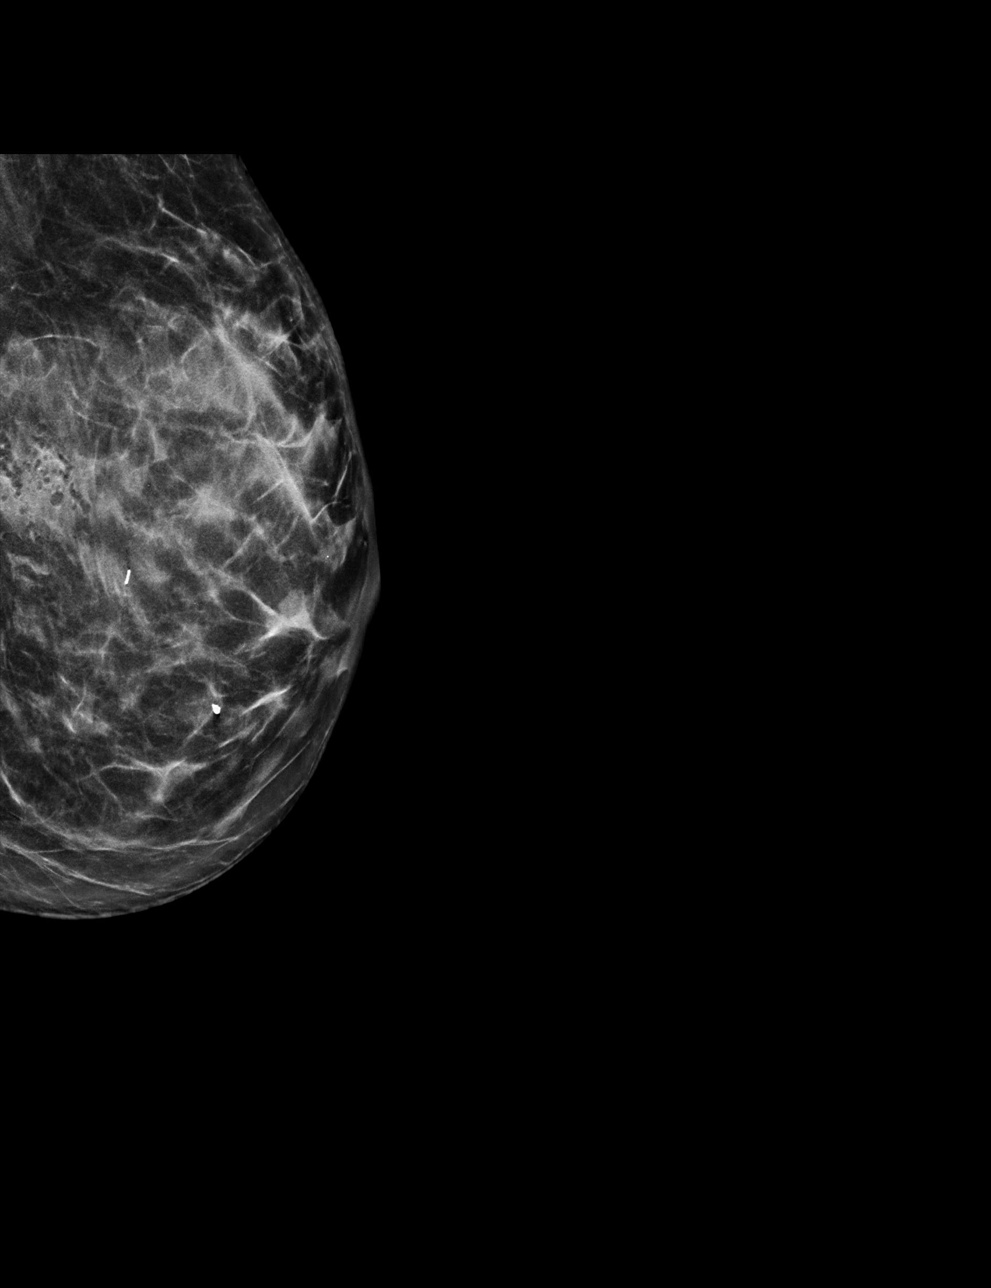

[L CC synth-2D]
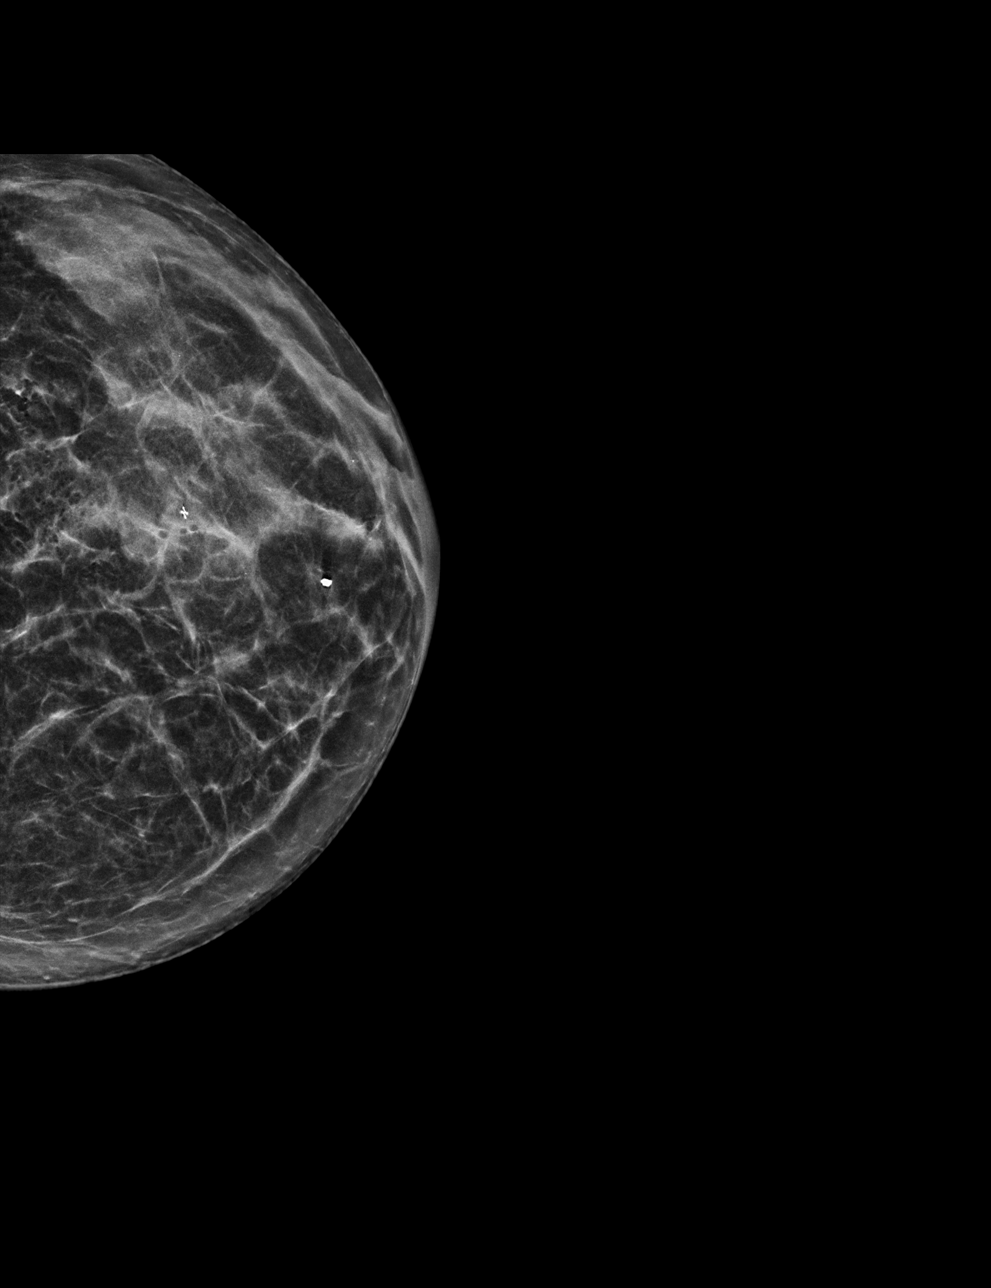

[L CC tomo · tomo slice 24/47.0]
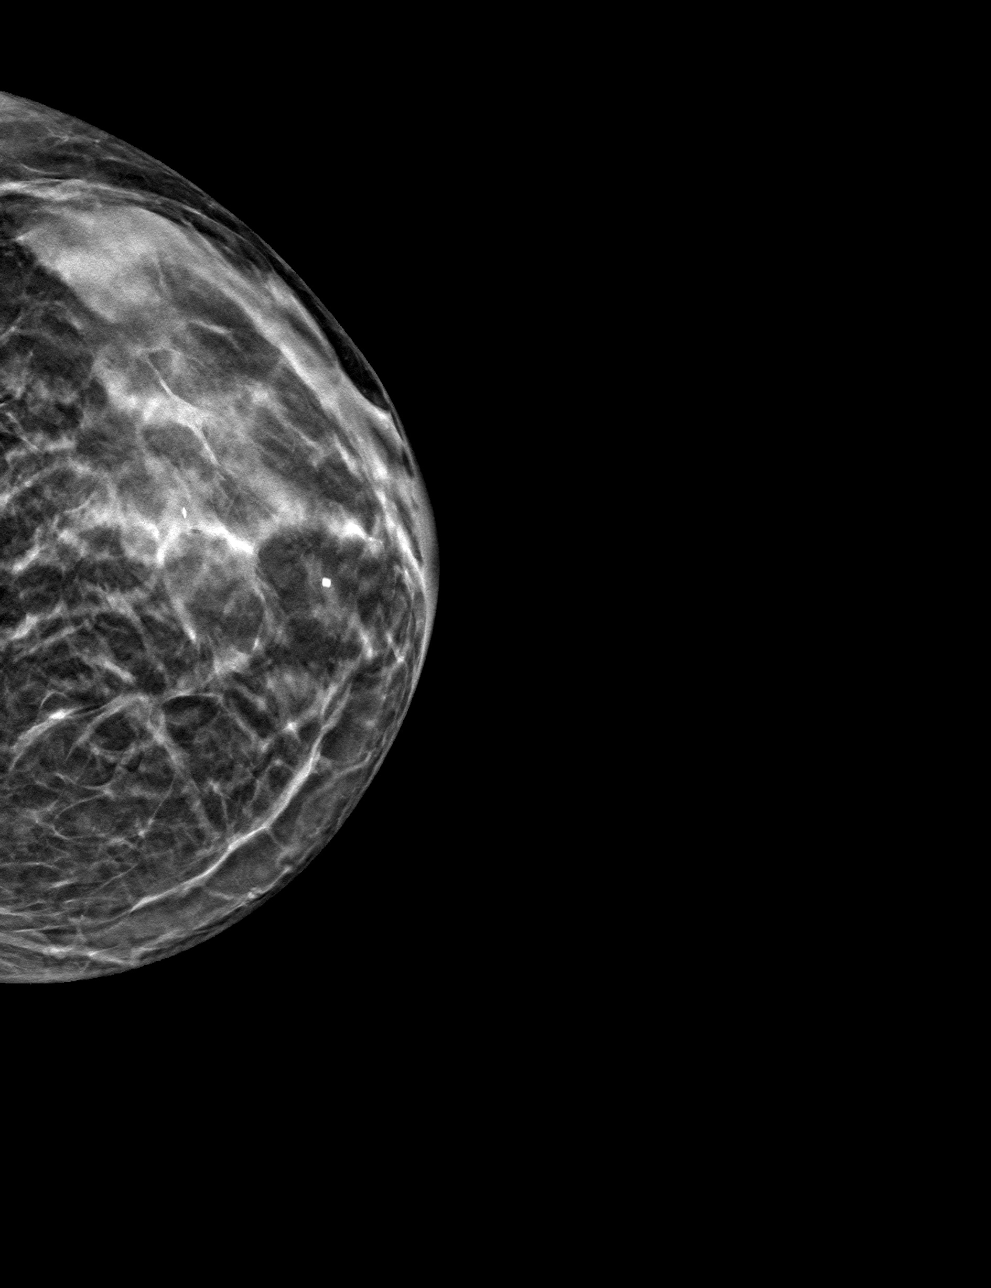

[L ML tomo · tomo slice 25/50.0]
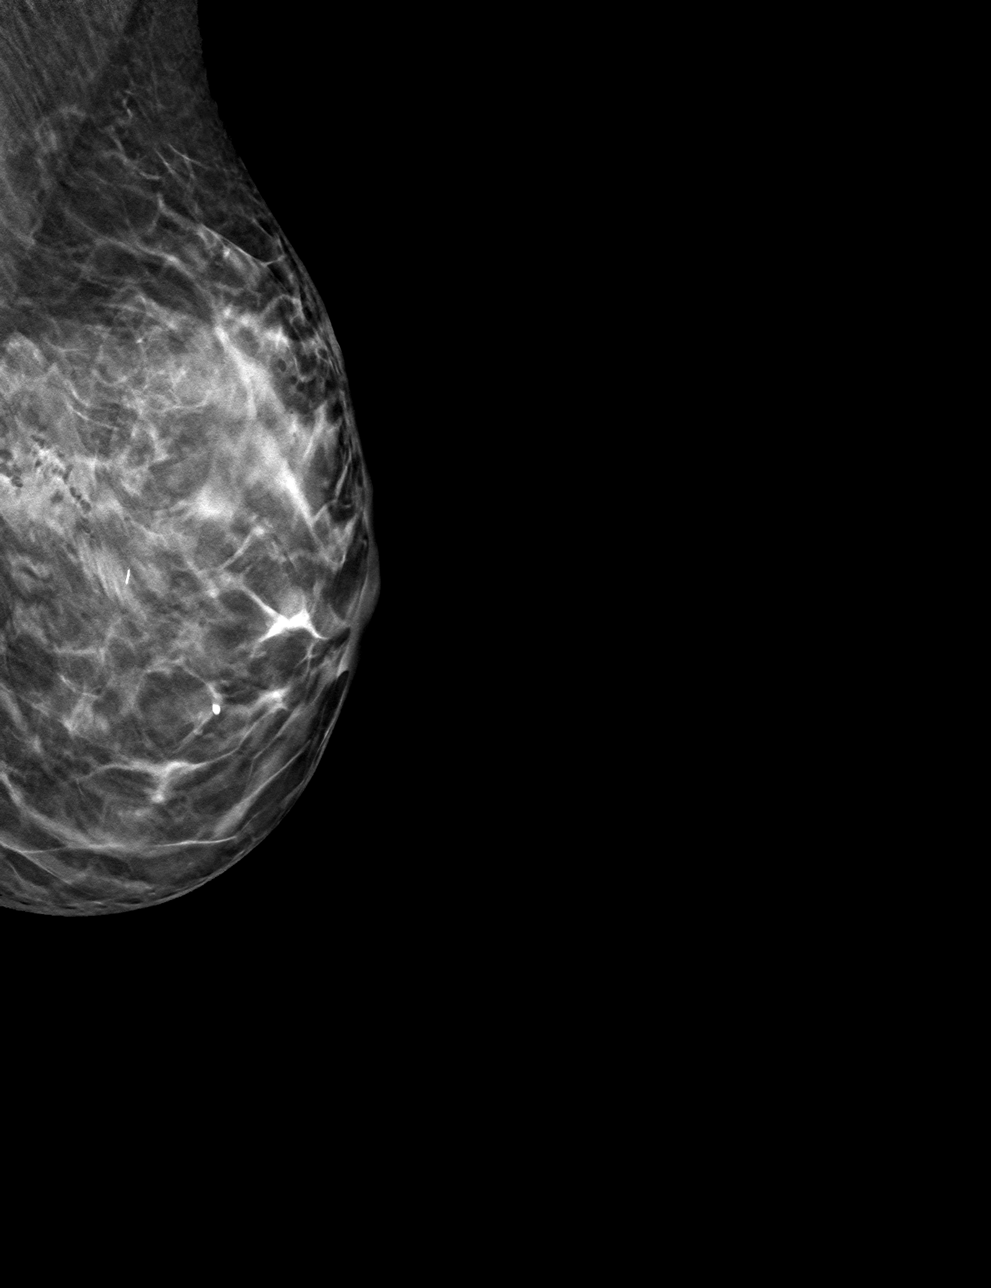

[4 of 12 positions shown; findings below may reference images not displayed]

FINDINGS: 3D Mammographic images were obtained following stereotactic guided
biopsy of left breast calcifications. The biopsy marking clip is in
expected position at the site of biopsy.
IMPRESSION: Appropriate positioning of the X shaped biopsy marking clip at the
site of biopsy in the upper-outer left breast.

Final Assessment: Post Procedure Mammograms for Marker Placement

ADDENDUM:
Addendum for correction of clip placement.

The X shaped marking clip is located approximately 1 cm inferior to
the site of biopsy.

*** End of Addendum ***
FINDINGS: 3D Mammographic images were obtained following stereotactic guided
biopsy of left breast calcifications. The biopsy marking clip is in
expected position at the site of biopsy.
IMPRESSION: Appropriate positioning of the X shaped biopsy marking clip at the
site of biopsy in the upper-outer left breast.

Final Assessment: Post Procedure Mammograms for Marker Placement

## 2021-07-18 IMAGING — MG MM BREAST BX W LOC DEV 1ST LESION IMAGE BX SPEC STEREO GUIDE*L*
8 of 10 series · 8 of 18 positions shown · non-contrast
Comparison: Previous exams.
COMPARISON: Previous exams.

Addendum:
CLINICAL DATA: Patient with indeterminate left breast
calcifications.

EXAM:
LEFT BREAST STEREOTACTIC CORE NEEDLE BIOPSY

[L (1 of 7)]
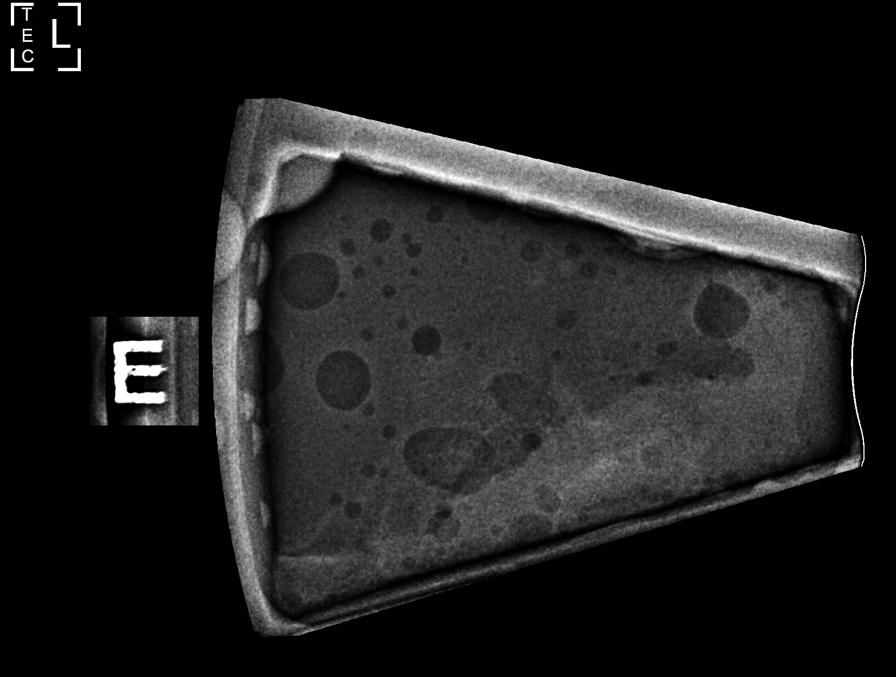

[L (2 of 7)]
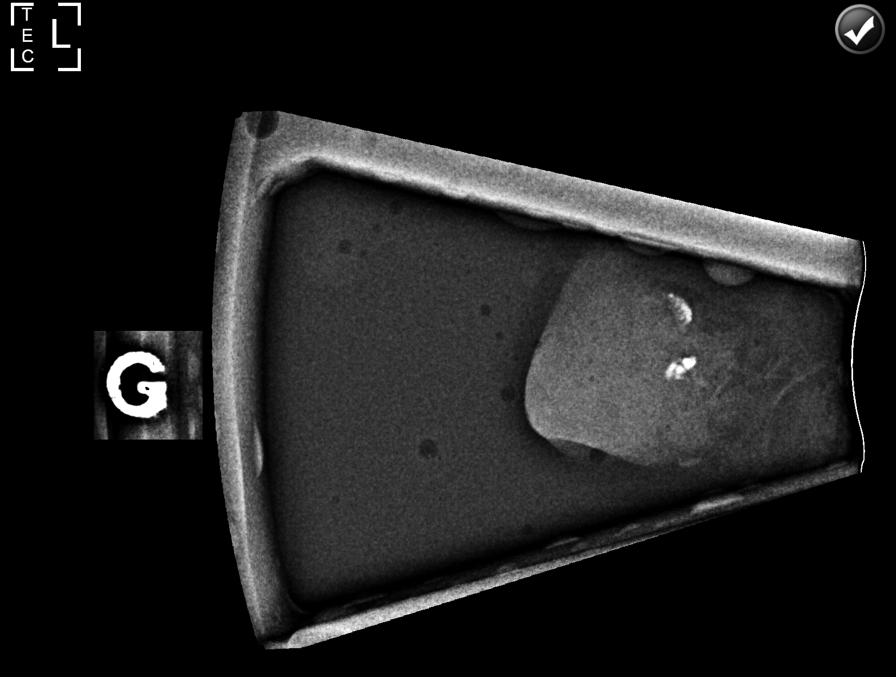

[L (3 of 7)]
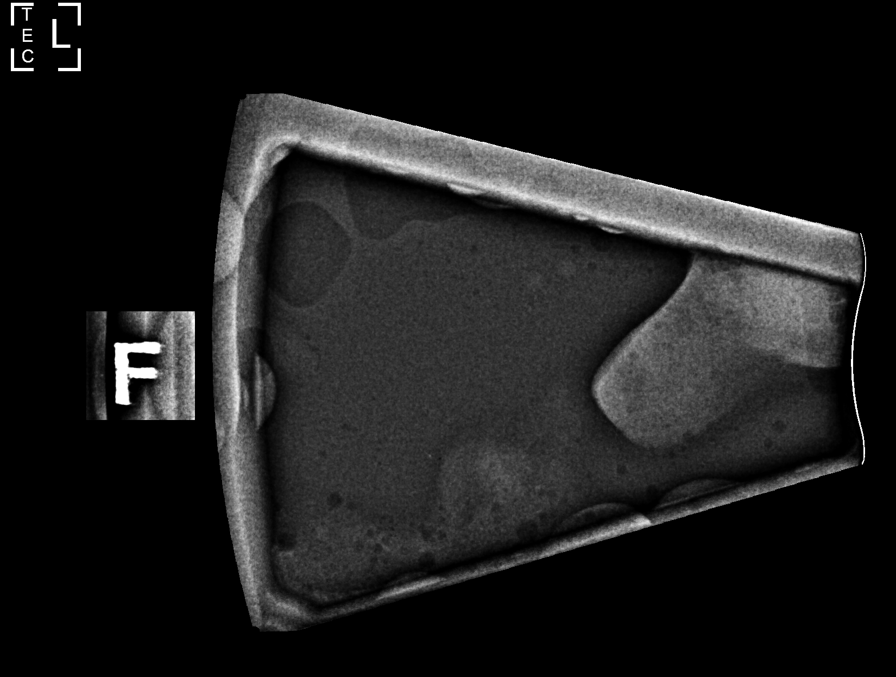

[L (4 of 7)]
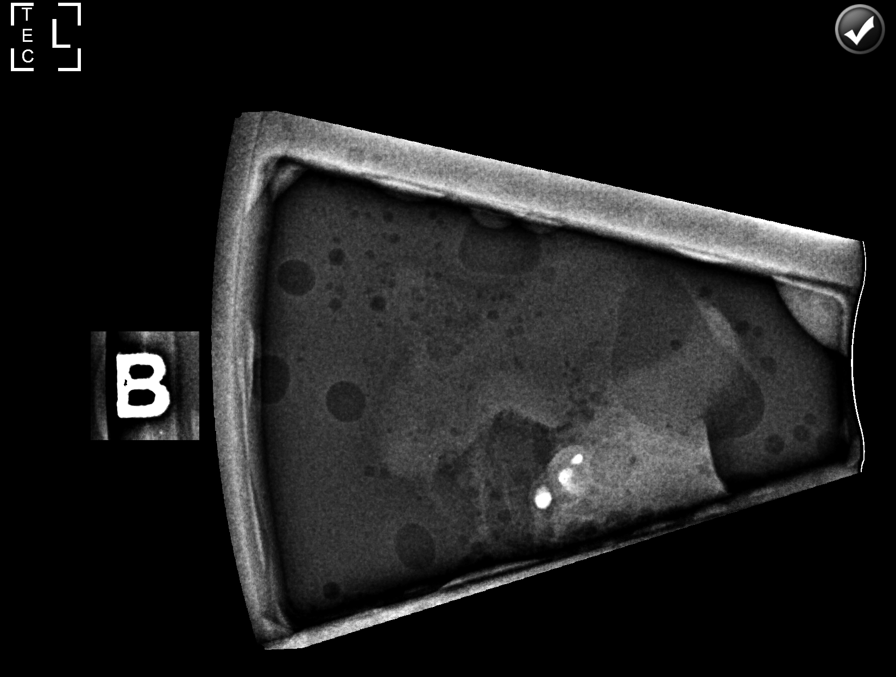

[L (5 of 7)]
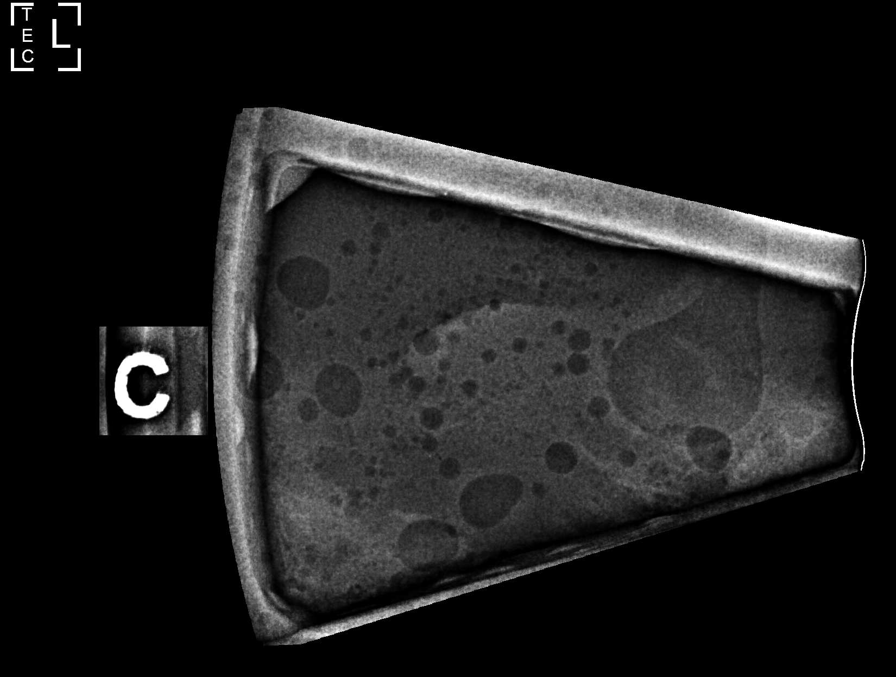

[L (6 of 7)]
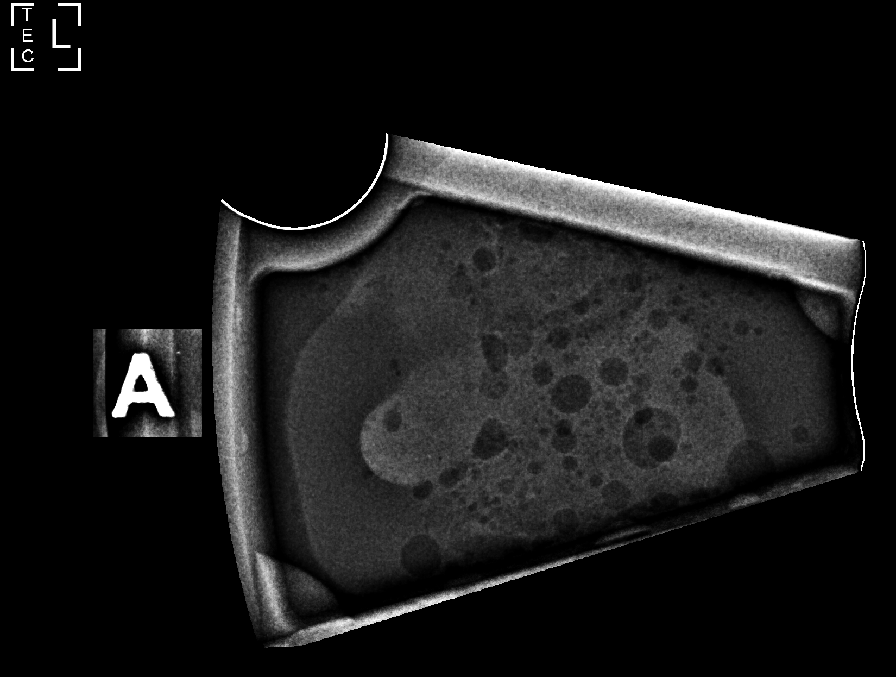

[L (7 of 7)]
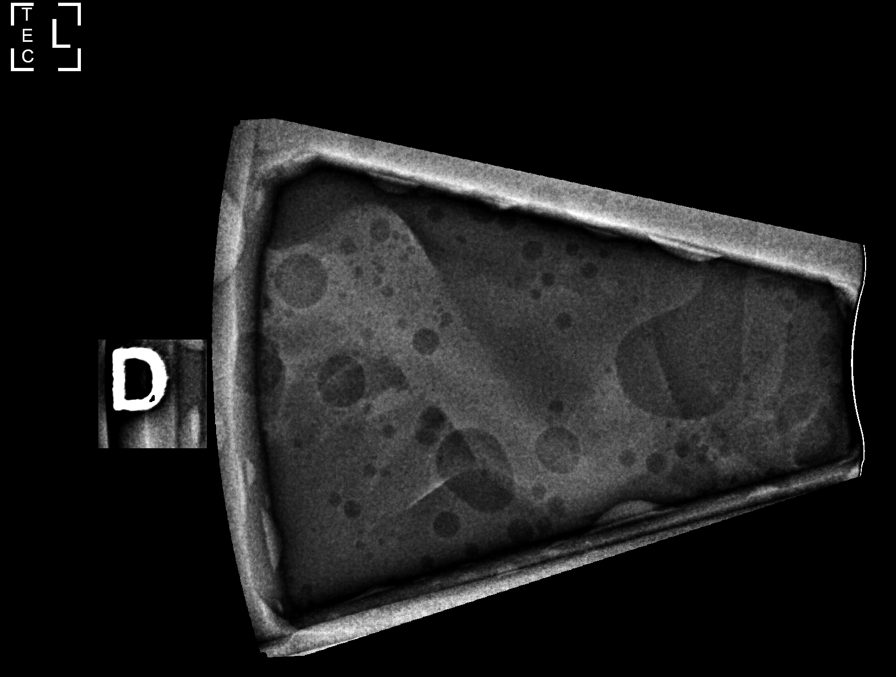

[L CC]
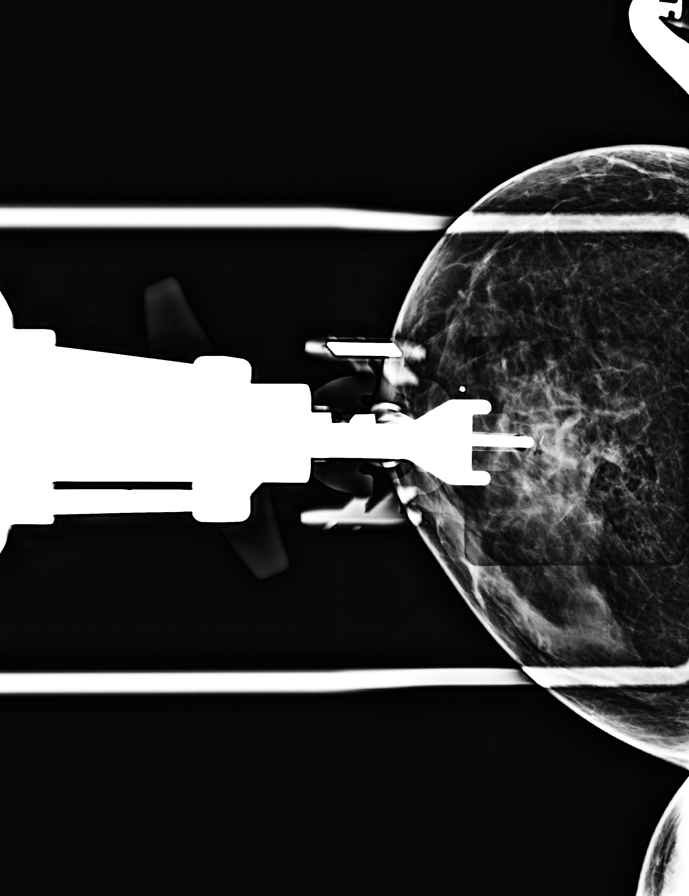

[8 of 18 positions shown; findings below may reference images not displayed]



Using sterile technique and 1% Lidocaine as local anesthetic, under
stereotactic guidance, a 9 gauge vacuum assisted device was used to
perform core needle biopsy of calcifications superior left breast
using a cranial approach. Specimen radiograph was performed showing
calcifications. Specimens with calcifications are identified for
pathology.

Lesion quadrant: Upper outer quadrant

At the conclusion of the procedure, X shaped tissue marker clip was
deployed into the biopsy cavity. Follow-up 2-view mammogram was
performed and dictated separately.
IMPRESSION: Stereotactic-guided biopsy of left breast calcifications. No
apparent complications.

ADDENDUM:
Pathology revealed BENIGN BREAST WITH MILDLY PROLIFERATIVE
FIBROCYSTIC CHANGES INCLUDING STROMAL FIBROSIS, CYSTIC DILATATION OF
DUCTS, ADENOSIS AND FOCAL EPITHELIAL HYPERPLASIA WITHOUT ATYPIA,
MICROCALCIFICATIONS PRESENT WITHIN DILATED DUCTS AND ADENOSIS of the
LEFT breast, superior, (x clip). This was found to be concordant by
Dr. SMLATIC.

Pathology results were discussed with the patient by telephone. The
patient reported doing well after the biopsy with bleeding and
tenderness at the site. Post biopsy instructions and care were
reviewed and questions were answered. The patient was encouraged to
call The [REDACTED] for any additional
concerns. My direct phone number was provided.

The patient was instructed to return for annual screening
mammography in [DATE].

Pathology results reported by SMLATIC, RN on [DATE].



Using sterile technique and 1% Lidocaine as local anesthetic, under
stereotactic guidance, a 9 gauge vacuum assisted device was used to
perform core needle biopsy of calcifications superior left breast
using a cranial approach. Specimen radiograph was performed showing
calcifications. Specimens with calcifications are identified for
pathology.

Lesion quadrant: Upper outer quadrant

At the conclusion of the procedure, X shaped tissue marker clip was
deployed into the biopsy cavity. Follow-up 2-view mammogram was
performed and dictated separately.
IMPRESSION: Stereotactic-guided biopsy of left breast calcifications. No
apparent complications.

## 2021-08-02 ENCOUNTER — Other Ambulatory Visit: Payer: Self-pay | Admitting: Family Medicine

## 2021-08-02 DIAGNOSIS — N611 Abscess of the breast and nipple: Secondary | ICD-10-CM

## 2021-08-05 ENCOUNTER — Other Ambulatory Visit: Payer: Self-pay

## 2021-08-05 ENCOUNTER — Ambulatory Visit
Admission: RE | Admit: 2021-08-05 | Discharge: 2021-08-05 | Disposition: A | Discharge status: Home / Self Care | Payer: BC Managed Care – PPO | Source: Ambulatory Visit | Attending: Family Medicine | Admitting: Family Medicine

## 2021-08-05 DIAGNOSIS — N611 Abscess of the breast and nipple: Secondary | ICD-10-CM

## 2021-08-05 DIAGNOSIS — N644 Mastodynia: Secondary | ICD-10-CM | POA: Diagnosis not present

## 2021-08-05 IMAGING — US US BREAST*L* LIMITED INC AXILLA
1 series · 5 of 5 positions shown · non-contrast
Comparison: Previous exam(s).

CLINICAL DATA: Firm area of palpable thickening with tenderness and
bruising in the 12 o'clock position of the left breast beginning 2
days after stereotactic guided core needle biopsy on [DATE].

EXAM:
ULTRASOUND OF THE LEFT BREAST

[Series 1: us breast*left* limited inc axilla · 0.07mm/px · 5 of 5 slices shown]
[im 1/5]
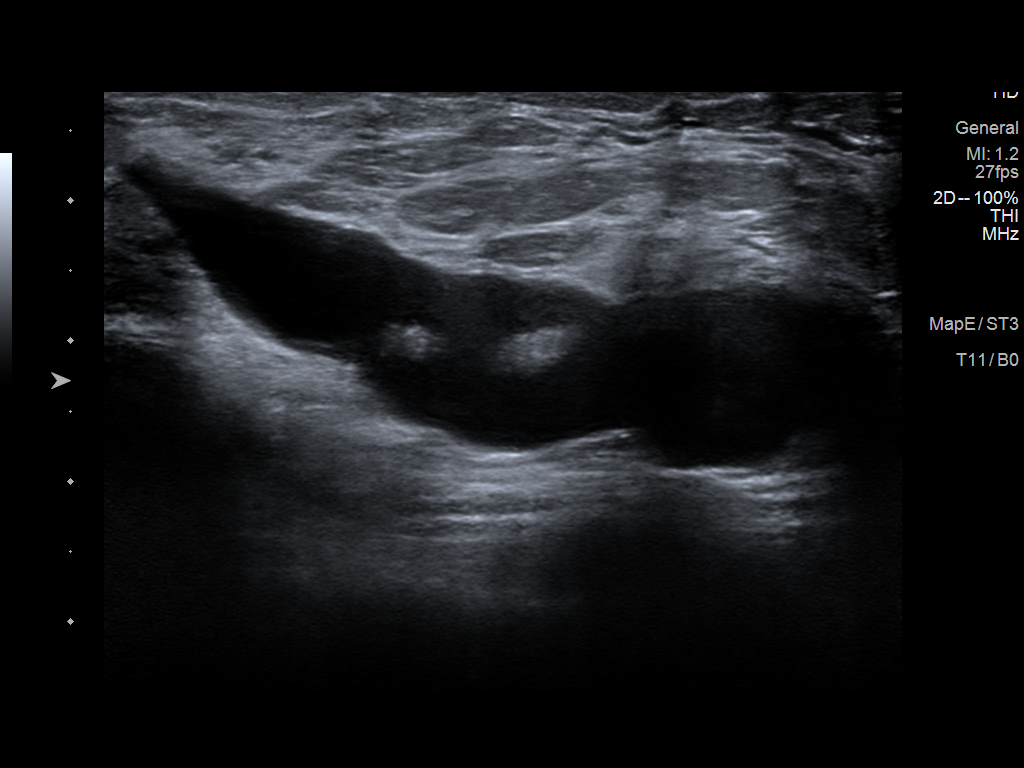
[im 2/5]
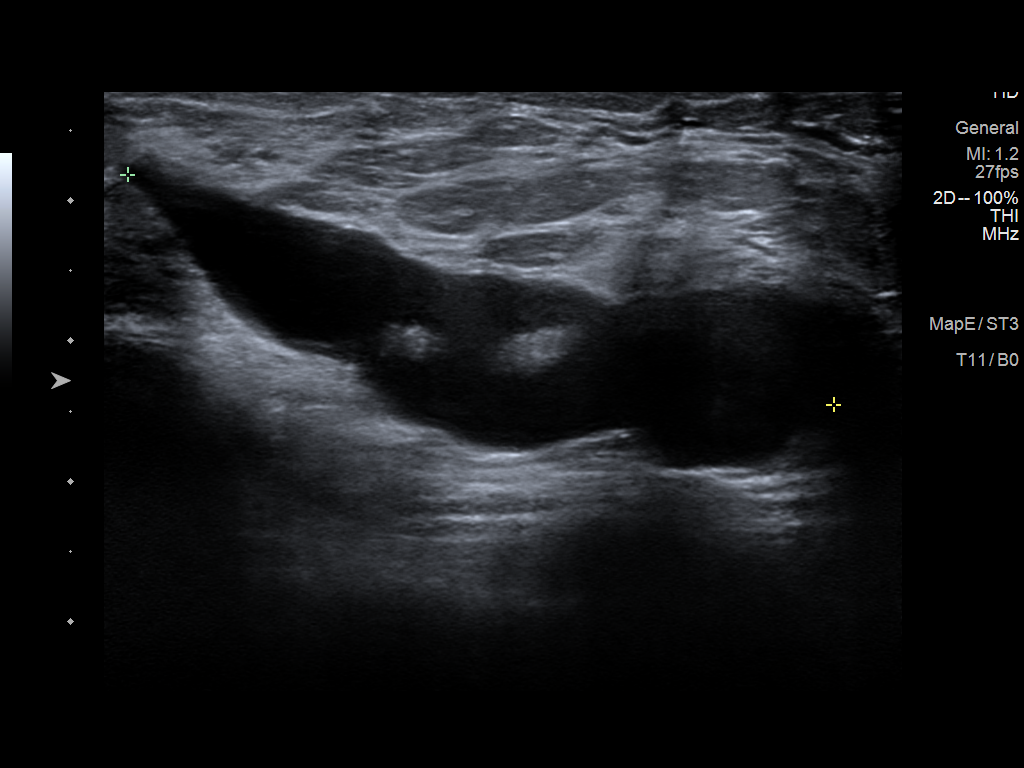
[im 3/5]
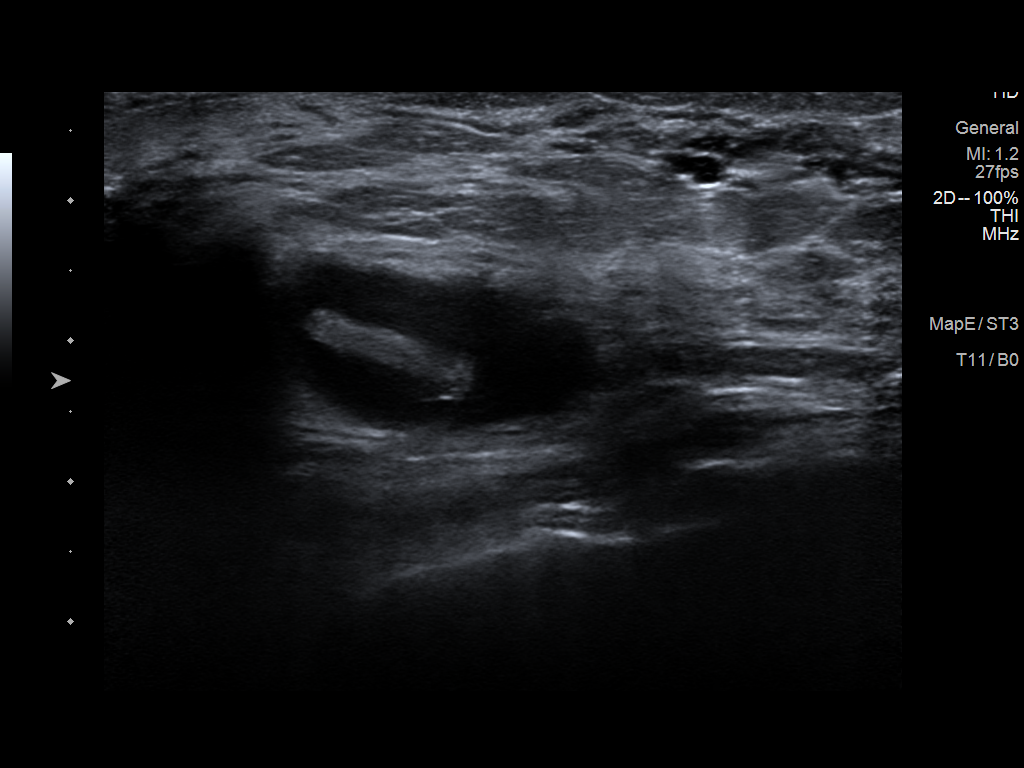
[im 4/5]
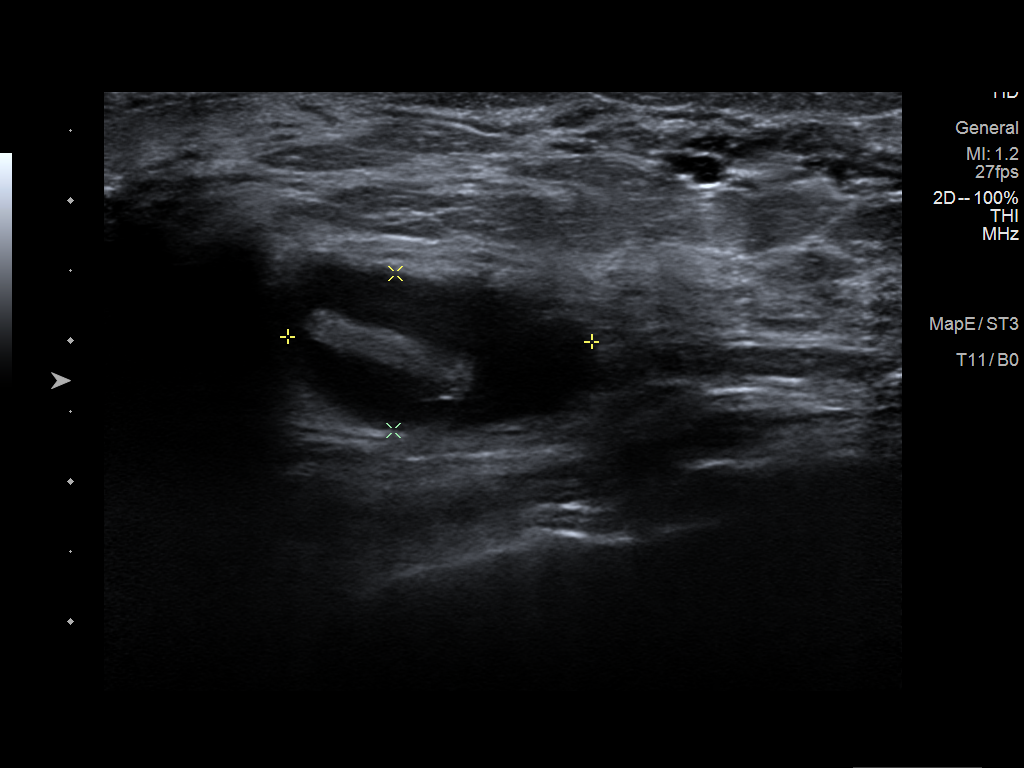
[im 5/5]
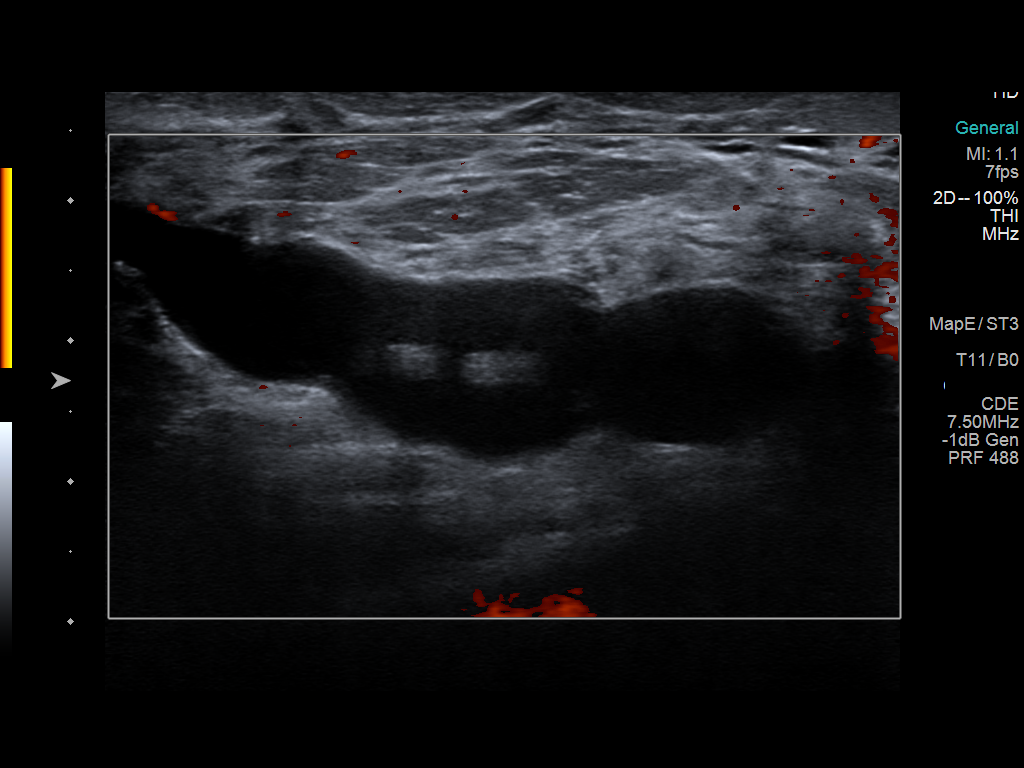

[5 of 5 positions shown; findings below may reference images not displayed]

FINDINGS: On physical exam, the patient has an approximately 5 x 2 cm
vertically oriented palpable mass extending from the needle entry
site in the 12 o'clock position of the left breast to the 6 o'clock
position. She also has inferomedial breast bruising. There is no
redness or increased warmth and no significant tenderness to
palpation.

Targeted ultrasound is performed, showing an elongated fluid
collection extending from the 12 o'clock to the 6 o'clock position
of the left breast, measuring 5.3 x 2.2 x 1.1 cm and containing a
small amount of internal clot.
IMPRESSION: 5.3 x 2.2 x 1.1 cm post biopsy hematoma in the left breast, as
described above.

RECOMMENDATION:
1. Symptomatic management of the left breast with options discussed
with the patient including oral Aleve, topical analgesic and
anti-inflammatory creams and continued use of ice PACS as needed.
2. Bilateral screening mammogram in [DATE] when due.

I have discussed the findings and recommendations with the patient.
If applicable, a reminder letter will be sent to the patient
regarding the next appointment.

BI-RADS CATEGORY  2: Benign.

## 2021-08-19 DIAGNOSIS — G47419 Narcolepsy without cataplexy: Secondary | ICD-10-CM | POA: Diagnosis not present

## 2021-08-19 DIAGNOSIS — Z5181 Encounter for therapeutic drug level monitoring: Secondary | ICD-10-CM | POA: Diagnosis not present

## 2021-08-19 DIAGNOSIS — G4719 Other hypersomnia: Secondary | ICD-10-CM | POA: Diagnosis not present

## 2021-08-19 DIAGNOSIS — G43009 Migraine without aura, not intractable, without status migrainosus: Secondary | ICD-10-CM | POA: Diagnosis not present

## 2021-08-21 DIAGNOSIS — G245 Blepharospasm: Secondary | ICD-10-CM | POA: Diagnosis not present

## 2021-09-09 DIAGNOSIS — H35363 Drusen (degenerative) of macula, bilateral: Secondary | ICD-10-CM | POA: Diagnosis not present

## 2021-09-09 DIAGNOSIS — Z79899 Other long term (current) drug therapy: Secondary | ICD-10-CM | POA: Diagnosis not present

## 2021-09-09 DIAGNOSIS — H25813 Combined forms of age-related cataract, bilateral: Secondary | ICD-10-CM | POA: Diagnosis not present

## 2021-09-23 ENCOUNTER — Ambulatory Visit
Admission: RE | Admit: 2021-09-23 | Discharge: 2021-09-23 | Disposition: A | Discharge status: Home / Self Care | Payer: BC Managed Care – PPO | Source: Ambulatory Visit | Attending: Family Medicine | Admitting: Family Medicine

## 2021-09-23 ENCOUNTER — Other Ambulatory Visit: Payer: Self-pay | Admitting: Family Medicine

## 2021-09-23 DIAGNOSIS — S6991XA Unspecified injury of right wrist, hand and finger(s), initial encounter: Secondary | ICD-10-CM

## 2021-09-23 DIAGNOSIS — M79644 Pain in right finger(s): Secondary | ICD-10-CM | POA: Diagnosis not present

## 2021-09-23 DIAGNOSIS — S63610A Unspecified sprain of right index finger, initial encounter: Secondary | ICD-10-CM | POA: Diagnosis not present

## 2021-09-23 IMAGING — CR DG FINGER INDEX 2+V*R*
3 series · 3 of 3 positions shown · non-contrast
Comparison: None available

CLINICAL DATA: Right index finger and metacarpal pain and injury.
Injury 11 days ago.

EXAM:
RIGHT INDEX FINGER 2+V

[x finger pa right]
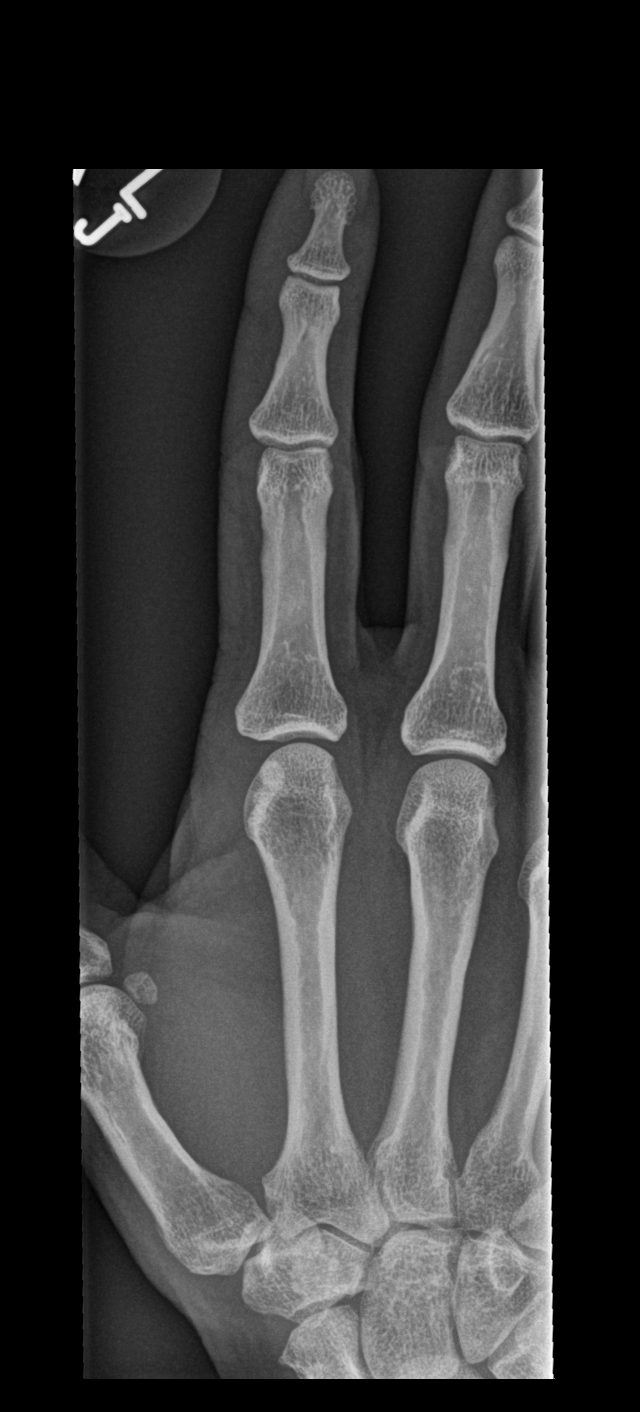

[x finger obl right]
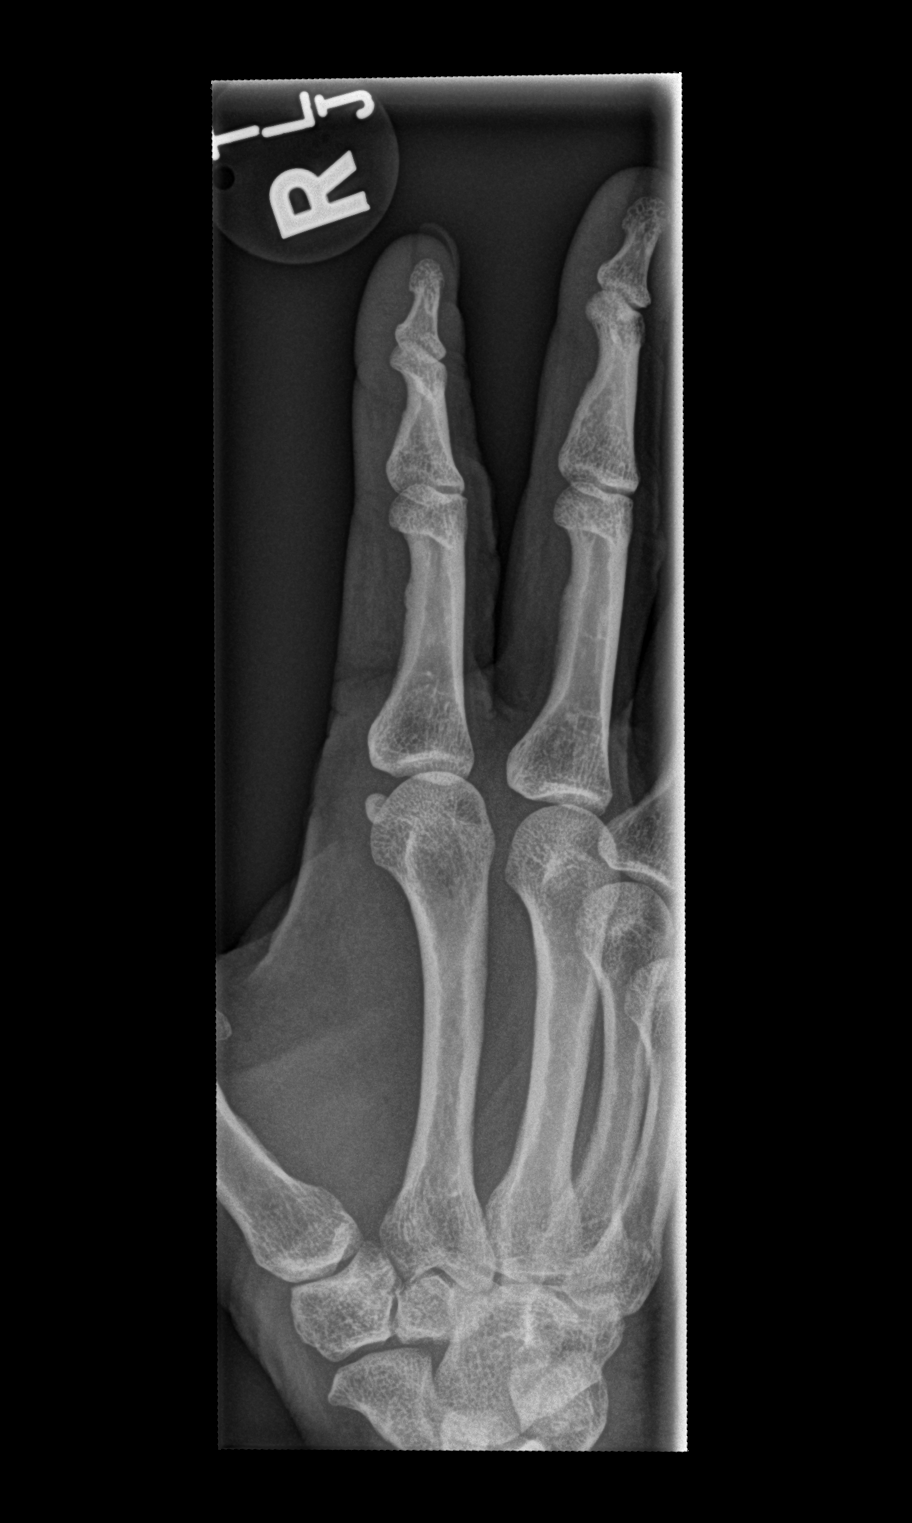

[x finger lat right]
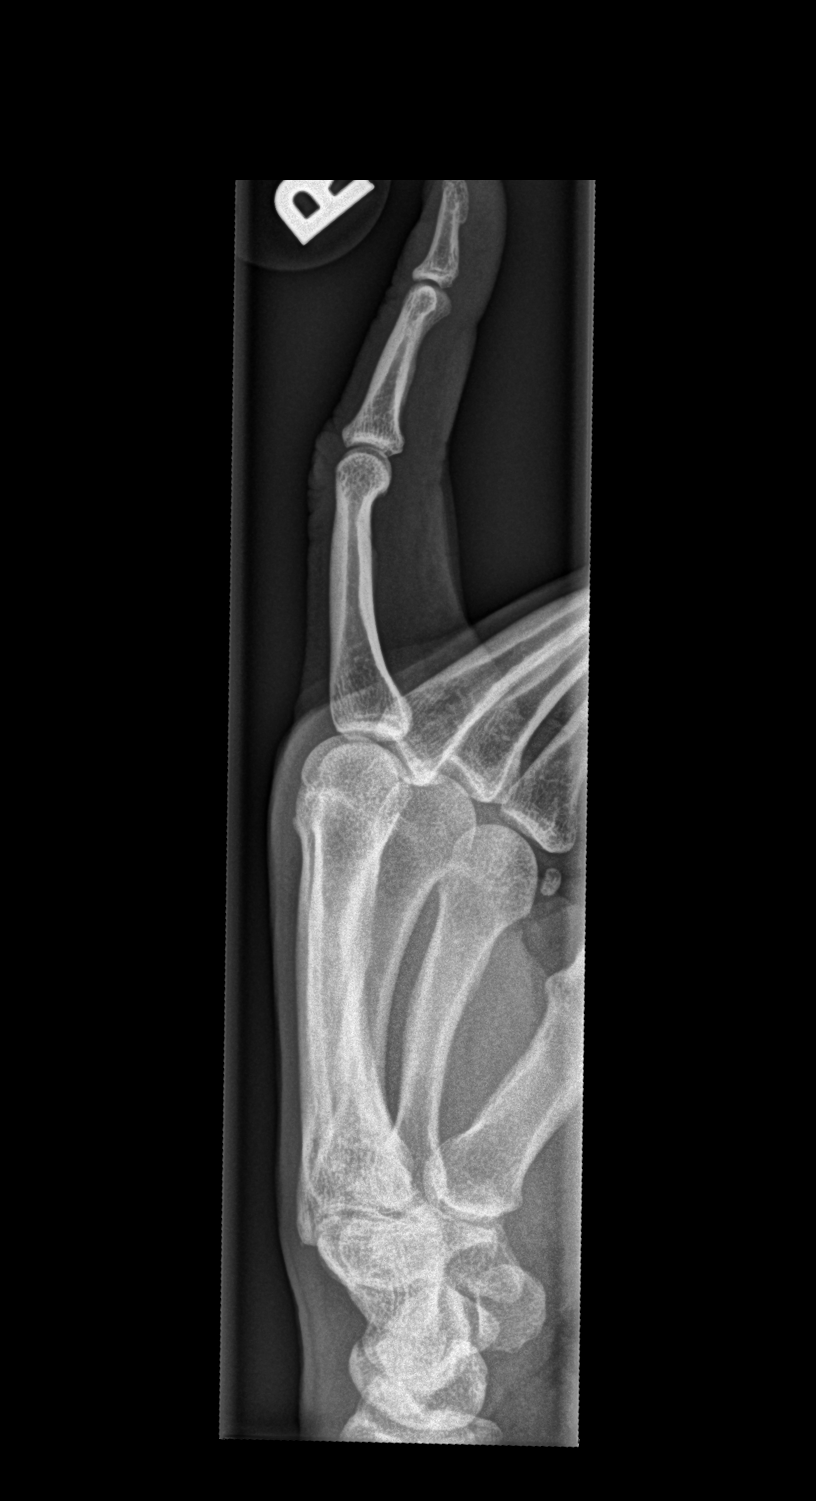

[3 of 3 positions shown; findings below may reference images not displayed]

FINDINGS: Normal bone mineralization. Index finger joint spaces are preserved.
No acute fracture is seen. No dislocation.

Mild triscaphe and thumb carpometacarpal joint space narrowing and
peripheral osteophytosis degenerative changes.
IMPRESSION: No acute fracture.

## 2021-09-26 DIAGNOSIS — M359 Systemic involvement of connective tissue, unspecified: Secondary | ICD-10-CM | POA: Diagnosis not present

## 2021-09-26 DIAGNOSIS — D72818 Other decreased white blood cell count: Secondary | ICD-10-CM | POA: Diagnosis not present

## 2021-09-26 DIAGNOSIS — I7300 Raynaud's syndrome without gangrene: Secondary | ICD-10-CM | POA: Diagnosis not present

## 2021-09-26 DIAGNOSIS — R040 Epistaxis: Secondary | ICD-10-CM | POA: Diagnosis not present

## 2021-11-13 DIAGNOSIS — G47419 Narcolepsy without cataplexy: Secondary | ICD-10-CM | POA: Diagnosis not present

## 2021-11-25 DIAGNOSIS — S63651A Sprain of metacarpophalangeal joint of left index finger, initial encounter: Secondary | ICD-10-CM | POA: Diagnosis not present

## 2021-11-27 DIAGNOSIS — S63659A Sprain of metacarpophalangeal joint of unspecified finger, initial encounter: Secondary | ICD-10-CM | POA: Diagnosis not present

## 2021-11-28 DIAGNOSIS — N898 Other specified noninflammatory disorders of vagina: Secondary | ICD-10-CM | POA: Diagnosis not present

## 2021-12-03 DIAGNOSIS — E559 Vitamin D deficiency, unspecified: Secondary | ICD-10-CM | POA: Diagnosis not present

## 2021-12-03 DIAGNOSIS — E7800 Pure hypercholesterolemia, unspecified: Secondary | ICD-10-CM | POA: Diagnosis not present

## 2021-12-03 DIAGNOSIS — Z0000 Encounter for general adult medical examination without abnormal findings: Secondary | ICD-10-CM | POA: Diagnosis not present

## 2021-12-03 DIAGNOSIS — E039 Hypothyroidism, unspecified: Secondary | ICD-10-CM | POA: Diagnosis not present

## 2021-12-03 DIAGNOSIS — Z79899 Other long term (current) drug therapy: Secondary | ICD-10-CM | POA: Diagnosis not present

## 2021-12-18 DIAGNOSIS — S63659A Sprain of metacarpophalangeal joint of unspecified finger, initial encounter: Secondary | ICD-10-CM | POA: Diagnosis not present

## 2021-12-30 DIAGNOSIS — Z01419 Encounter for gynecological examination (general) (routine) without abnormal findings: Secondary | ICD-10-CM | POA: Diagnosis not present

## 2021-12-30 DIAGNOSIS — Z681 Body mass index (BMI) 19 or less, adult: Secondary | ICD-10-CM | POA: Diagnosis not present

## 2021-12-30 DIAGNOSIS — N898 Other specified noninflammatory disorders of vagina: Secondary | ICD-10-CM | POA: Diagnosis not present

## 2022-01-15 DIAGNOSIS — S63659A Sprain of metacarpophalangeal joint of unspecified finger, initial encounter: Secondary | ICD-10-CM | POA: Diagnosis not present

## 2022-02-07 DIAGNOSIS — G4710 Hypersomnia, unspecified: Secondary | ICD-10-CM | POA: Diagnosis not present

## 2022-02-10 DIAGNOSIS — U071 COVID-19: Secondary | ICD-10-CM | POA: Diagnosis not present

## 2022-02-25 DIAGNOSIS — S63659A Sprain of metacarpophalangeal joint of unspecified finger, initial encounter: Secondary | ICD-10-CM | POA: Diagnosis not present

## 2022-02-28 ENCOUNTER — Other Ambulatory Visit (HOSPITAL_BASED_OUTPATIENT_CLINIC_OR_DEPARTMENT_OTHER): Payer: Self-pay | Admitting: Family Medicine

## 2022-02-28 ENCOUNTER — Ambulatory Visit (HOSPITAL_BASED_OUTPATIENT_CLINIC_OR_DEPARTMENT_OTHER)
Admission: RE | Admit: 2022-02-28 | Discharge: 2022-02-28 | Disposition: A | Discharge status: Home / Self Care | Payer: BC Managed Care – PPO | Source: Ambulatory Visit | Attending: Family Medicine | Admitting: Family Medicine

## 2022-02-28 DIAGNOSIS — J209 Acute bronchitis, unspecified: Secondary | ICD-10-CM | POA: Insufficient documentation

## 2022-04-07 DIAGNOSIS — R040 Epistaxis: Secondary | ICD-10-CM | POA: Diagnosis not present

## 2022-04-07 DIAGNOSIS — M359 Systemic involvement of connective tissue, unspecified: Secondary | ICD-10-CM | POA: Diagnosis not present

## 2022-04-07 DIAGNOSIS — I7300 Raynaud's syndrome without gangrene: Secondary | ICD-10-CM | POA: Diagnosis not present

## 2022-04-07 DIAGNOSIS — D72818 Other decreased white blood cell count: Secondary | ICD-10-CM | POA: Diagnosis not present

## 2022-04-30 DIAGNOSIS — M25561 Pain in right knee: Secondary | ICD-10-CM | POA: Diagnosis not present

## 2022-05-01 DIAGNOSIS — M25561 Pain in right knee: Secondary | ICD-10-CM | POA: Diagnosis not present

## 2022-05-16 DIAGNOSIS — G4711 Idiopathic hypersomnia with long sleep time: Secondary | ICD-10-CM | POA: Diagnosis not present

## 2022-05-16 DIAGNOSIS — G43009 Migraine without aura, not intractable, without status migrainosus: Secondary | ICD-10-CM | POA: Diagnosis not present

## 2022-07-01 DIAGNOSIS — G245 Blepharospasm: Secondary | ICD-10-CM | POA: Diagnosis not present

## 2022-07-11 ENCOUNTER — Other Ambulatory Visit: Payer: Self-pay | Admitting: Obstetrics

## 2022-07-11 DIAGNOSIS — Z1231 Encounter for screening mammogram for malignant neoplasm of breast: Secondary | ICD-10-CM

## 2022-08-22 ENCOUNTER — Ambulatory Visit
Admission: RE | Admit: 2022-08-22 | Discharge: 2022-08-22 | Disposition: A | Discharge status: Home / Self Care | Payer: BC Managed Care – PPO | Source: Ambulatory Visit | Attending: Obstetrics | Admitting: Obstetrics

## 2022-08-22 DIAGNOSIS — Z1231 Encounter for screening mammogram for malignant neoplasm of breast: Secondary | ICD-10-CM

## 2022-10-06 DIAGNOSIS — M25561 Pain in right knee: Secondary | ICD-10-CM | POA: Diagnosis not present

## 2022-10-06 DIAGNOSIS — M359 Systemic involvement of connective tissue, unspecified: Secondary | ICD-10-CM | POA: Diagnosis not present

## 2022-10-06 DIAGNOSIS — R040 Epistaxis: Secondary | ICD-10-CM | POA: Diagnosis not present

## 2022-10-06 DIAGNOSIS — D72818 Other decreased white blood cell count: Secondary | ICD-10-CM | POA: Diagnosis not present

## 2022-10-06 DIAGNOSIS — I7300 Raynaud's syndrome without gangrene: Secondary | ICD-10-CM | POA: Diagnosis not present

## 2022-10-07 DIAGNOSIS — H5203 Hypermetropia, bilateral: Secondary | ICD-10-CM | POA: Diagnosis not present

## 2022-11-07 DIAGNOSIS — H35363 Drusen (degenerative) of macula, bilateral: Secondary | ICD-10-CM | POA: Diagnosis not present

## 2022-11-07 DIAGNOSIS — H25813 Combined forms of age-related cataract, bilateral: Secondary | ICD-10-CM | POA: Diagnosis not present

## 2022-11-07 DIAGNOSIS — Z79899 Other long term (current) drug therapy: Secondary | ICD-10-CM | POA: Diagnosis not present

## 2022-11-10 DIAGNOSIS — G4711 Idiopathic hypersomnia with long sleep time: Secondary | ICD-10-CM | POA: Diagnosis not present

## 2022-11-10 DIAGNOSIS — G43009 Migraine without aura, not intractable, without status migrainosus: Secondary | ICD-10-CM | POA: Diagnosis not present

## 2022-12-16 ENCOUNTER — Other Ambulatory Visit (HOSPITAL_BASED_OUTPATIENT_CLINIC_OR_DEPARTMENT_OTHER): Payer: Self-pay

## 2022-12-16 DIAGNOSIS — Z79899 Other long term (current) drug therapy: Secondary | ICD-10-CM | POA: Diagnosis not present

## 2022-12-16 DIAGNOSIS — E7800 Pure hypercholesterolemia, unspecified: Secondary | ICD-10-CM | POA: Diagnosis not present

## 2022-12-16 DIAGNOSIS — E559 Vitamin D deficiency, unspecified: Secondary | ICD-10-CM | POA: Diagnosis not present

## 2022-12-16 DIAGNOSIS — E039 Hypothyroidism, unspecified: Secondary | ICD-10-CM | POA: Diagnosis not present

## 2022-12-16 DIAGNOSIS — Z0000 Encounter for general adult medical examination without abnormal findings: Secondary | ICD-10-CM | POA: Diagnosis not present

## 2022-12-16 LAB — LAB REPORT - SCANNED: EGFR: 87

## 2022-12-16 MED ORDER — MOLNUPIRAVIR 200 MG PO CAPS
4.0000 | ORAL_CAPSULE | Freq: Two times a day (BID) | ORAL | 0 refills | Status: AC
  Filled 2022-12-16: qty 40, 5d supply, fill #0

## 2023-01-06 ENCOUNTER — Other Ambulatory Visit (HOSPITAL_BASED_OUTPATIENT_CLINIC_OR_DEPARTMENT_OTHER): Payer: Self-pay

## 2023-01-06 MED ORDER — TOPIRAMATE ER 200 MG PO CAP24
400.0000 mg | ORAL_CAPSULE | Freq: Every day | ORAL | 11 refills | Status: DC
  Filled 2023-01-06: qty 60, 30d supply, fill #0
  Filled 2023-01-31: qty 60, 30d supply, fill #1
  Filled 2023-03-05: qty 60, 30d supply, fill #2
  Filled 2023-04-03: qty 60, 30d supply, fill #3
  Filled 2023-05-03: qty 60, 30d supply, fill #4

## 2023-01-07 ENCOUNTER — Other Ambulatory Visit: Payer: Self-pay

## 2023-01-07 ENCOUNTER — Other Ambulatory Visit (HOSPITAL_BASED_OUTPATIENT_CLINIC_OR_DEPARTMENT_OTHER): Payer: Self-pay

## 2023-01-13 DIAGNOSIS — G245 Blepharospasm: Secondary | ICD-10-CM | POA: Diagnosis not present

## 2023-01-19 DIAGNOSIS — R300 Dysuria: Secondary | ICD-10-CM | POA: Diagnosis not present

## 2023-01-19 DIAGNOSIS — B3731 Acute candidiasis of vulva and vagina: Secondary | ICD-10-CM | POA: Diagnosis not present

## 2023-02-02 ENCOUNTER — Other Ambulatory Visit: Payer: Self-pay

## 2023-02-02 ENCOUNTER — Other Ambulatory Visit (HOSPITAL_BASED_OUTPATIENT_CLINIC_OR_DEPARTMENT_OTHER): Payer: Self-pay

## 2023-02-06 ENCOUNTER — Other Ambulatory Visit (HOSPITAL_BASED_OUTPATIENT_CLINIC_OR_DEPARTMENT_OTHER): Payer: Self-pay

## 2023-02-06 DIAGNOSIS — U071 COVID-19: Secondary | ICD-10-CM | POA: Diagnosis not present

## 2023-02-06 MED ORDER — PAXLOVID (150/100) 10 X 150 MG & 10 X 100MG PO TBPK
ORAL_TABLET | ORAL | 0 refills | Status: DC
  Filled 2023-02-06: qty 20, 5d supply, fill #0

## 2023-03-02 ENCOUNTER — Other Ambulatory Visit: Payer: Self-pay | Admitting: Obstetrics

## 2023-03-02 DIAGNOSIS — M8580 Other specified disorders of bone density and structure, unspecified site: Secondary | ICD-10-CM | POA: Diagnosis not present

## 2023-03-02 DIAGNOSIS — Z01411 Encounter for gynecological examination (general) (routine) with abnormal findings: Secondary | ICD-10-CM | POA: Diagnosis not present

## 2023-03-02 DIAGNOSIS — R350 Frequency of micturition: Secondary | ICD-10-CM | POA: Diagnosis not present

## 2023-03-02 DIAGNOSIS — N952 Postmenopausal atrophic vaginitis: Secondary | ICD-10-CM | POA: Diagnosis not present

## 2023-03-02 DIAGNOSIS — Z1331 Encounter for screening for depression: Secondary | ICD-10-CM | POA: Diagnosis not present

## 2023-03-02 DIAGNOSIS — B3731 Acute candidiasis of vulva and vagina: Secondary | ICD-10-CM | POA: Diagnosis not present

## 2023-03-06 ENCOUNTER — Other Ambulatory Visit: Payer: Self-pay

## 2023-03-06 ENCOUNTER — Other Ambulatory Visit (HOSPITAL_BASED_OUTPATIENT_CLINIC_OR_DEPARTMENT_OTHER): Payer: Self-pay

## 2023-03-09 ENCOUNTER — Other Ambulatory Visit (HOSPITAL_BASED_OUTPATIENT_CLINIC_OR_DEPARTMENT_OTHER): Payer: Self-pay

## 2023-03-09 DIAGNOSIS — Z1283 Encounter for screening for malignant neoplasm of skin: Secondary | ICD-10-CM | POA: Diagnosis not present

## 2023-03-09 DIAGNOSIS — L258 Unspecified contact dermatitis due to other agents: Secondary | ICD-10-CM | POA: Diagnosis not present

## 2023-03-09 DIAGNOSIS — D225 Melanocytic nevi of trunk: Secondary | ICD-10-CM | POA: Diagnosis not present

## 2023-03-09 MED ORDER — DESONIDE 0.05 % EX CREA
1.0000 | TOPICAL_CREAM | Freq: Two times a day (BID) | CUTANEOUS | 3 refills | Status: DC
  Filled 2023-03-09: qty 60, 30d supply, fill #0

## 2023-03-17 DIAGNOSIS — Z1330 Encounter for screening examination for mental health and behavioral disorders, unspecified: Secondary | ICD-10-CM | POA: Diagnosis not present

## 2023-03-17 DIAGNOSIS — G4711 Idiopathic hypersomnia with long sleep time: Secondary | ICD-10-CM | POA: Diagnosis not present

## 2023-03-25 DIAGNOSIS — B370 Candidal stomatitis: Secondary | ICD-10-CM | POA: Diagnosis not present

## 2023-04-03 ENCOUNTER — Other Ambulatory Visit: Payer: Self-pay

## 2023-04-03 ENCOUNTER — Other Ambulatory Visit (HOSPITAL_BASED_OUTPATIENT_CLINIC_OR_DEPARTMENT_OTHER): Payer: Self-pay

## 2023-04-06 DIAGNOSIS — I7300 Raynaud's syndrome without gangrene: Secondary | ICD-10-CM | POA: Diagnosis not present

## 2023-04-06 DIAGNOSIS — M359 Systemic involvement of connective tissue, unspecified: Secondary | ICD-10-CM | POA: Diagnosis not present

## 2023-04-06 DIAGNOSIS — D72818 Other decreased white blood cell count: Secondary | ICD-10-CM | POA: Diagnosis not present

## 2023-04-06 DIAGNOSIS — R040 Epistaxis: Secondary | ICD-10-CM | POA: Diagnosis not present

## 2023-05-03 ENCOUNTER — Encounter: Payer: Self-pay | Admitting: Infectious Disease

## 2023-05-03 DIAGNOSIS — B370 Candidal stomatitis: Secondary | ICD-10-CM | POA: Insufficient documentation

## 2023-05-03 HISTORY — DX: Candidal stomatitis: B37.0
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
## 2023-05-04 ENCOUNTER — Ambulatory Visit (INDEPENDENT_AMBULATORY_CARE_PROVIDER_SITE_OTHER): Payer: BC Managed Care – PPO | Admitting: Infectious Disease

## 2023-05-04 ENCOUNTER — Other Ambulatory Visit (HOSPITAL_BASED_OUTPATIENT_CLINIC_OR_DEPARTMENT_OTHER): Payer: Self-pay

## 2023-05-04 ENCOUNTER — Encounter: Payer: Self-pay | Admitting: Infectious Disease

## 2023-05-04 ENCOUNTER — Other Ambulatory Visit: Payer: Self-pay

## 2023-05-04 VITALS — BP 133/81 | HR 66 | Temp 97.9°F | Ht 65.0 in | Wt 116.0 lb

## 2023-05-04 DIAGNOSIS — K141 Geographic tongue: Secondary | ICD-10-CM

## 2023-05-04 DIAGNOSIS — M329 Systemic lupus erythematosus, unspecified: Secondary | ICD-10-CM | POA: Insufficient documentation

## 2023-05-04 DIAGNOSIS — K146 Glossodynia: Secondary | ICD-10-CM | POA: Insufficient documentation

## 2023-05-04 DIAGNOSIS — U099 Post covid-19 condition, unspecified: Secondary | ICD-10-CM

## 2023-05-04 DIAGNOSIS — K1370 Unspecified lesions of oral mucosa: Secondary | ICD-10-CM

## 2023-05-04 DIAGNOSIS — R1310 Dysphagia, unspecified: Secondary | ICD-10-CM | POA: Insufficient documentation

## 2023-05-04 DIAGNOSIS — G479 Sleep disorder, unspecified: Secondary | ICD-10-CM | POA: Diagnosis not present

## 2023-05-04 DIAGNOSIS — G43009 Migraine without aura, not intractable, without status migrainosus: Secondary | ICD-10-CM | POA: Diagnosis not present

## 2023-05-04 DIAGNOSIS — Z113 Encounter for screening for infections with a predominantly sexual mode of transmission: Secondary | ICD-10-CM | POA: Diagnosis not present

## 2023-05-04 DIAGNOSIS — B370 Candidal stomatitis: Secondary | ICD-10-CM | POA: Diagnosis not present

## 2023-05-04 DIAGNOSIS — Z1159 Encounter for screening for other viral diseases: Secondary | ICD-10-CM | POA: Insufficient documentation

## 2023-05-04 DIAGNOSIS — R208 Other disturbances of skin sensation: Secondary | ICD-10-CM | POA: Diagnosis not present

## 2023-05-04 DIAGNOSIS — M3219 Other organ or system involvement in systemic lupus erythematosus: Secondary | ICD-10-CM | POA: Diagnosis not present

## 2023-05-04 DIAGNOSIS — Z7185 Encounter for immunization safety counseling: Secondary | ICD-10-CM

## 2023-05-04 DIAGNOSIS — R1312 Dysphagia, oropharyngeal phase: Secondary | ICD-10-CM

## 2023-05-04 HISTORY — DX: Encounter for immunization safety counseling: Z71.85

## 2023-05-04 HISTORY — DX: Encounter for screening for other viral diseases: Z11.59

## 2023-05-04 HISTORY — DX: Post covid-19 condition, unspecified: U09.9

## 2023-05-04 HISTORY — DX: Dysphagia, unspecified: R13.10

## 2023-05-05 LAB — CBC WITH DIFFERENTIAL/PLATELET
Absolute Lymphocytes: 1255 {cells}/uL (ref 850–3900)
Absolute Monocytes: 180 {cells}/uL — ABNORMAL LOW (ref 200–950)
Basophils Absolute: 70 {cells}/uL (ref 0–200)
Basophils Relative: 2.8 %
Eosinophils Absolute: 80 {cells}/uL (ref 15–500)
Eosinophils Relative: 3.2 %
HCT: 41.6 % (ref 35.0–45.0)
Hemoglobin: 13.3 g/dL (ref 11.7–15.5)
MCH: 30.6 pg (ref 27.0–33.0)
MCHC: 32 g/dL (ref 32.0–36.0)
MCV: 95.9 fL (ref 80.0–100.0)
MPV: 12.6 fL — ABNORMAL HIGH (ref 7.5–12.5)
Monocytes Relative: 7.2 %
Neutro Abs: 915 {cells}/uL — ABNORMAL LOW (ref 1500–7800)
Neutrophils Relative %: 36.6 %
Platelets: 168 10*3/uL (ref 140–400)
RBC: 4.34 10*6/uL (ref 3.80–5.10)
RDW: 10.9 % — ABNORMAL LOW (ref 11.0–15.0)
Total Lymphocyte: 50.2 %
WBC: 2.5 10*3/uL — ABNORMAL LOW (ref 3.8–10.8)

## 2023-05-05 LAB — COMPLETE METABOLIC PANEL WITH GFR
AG Ratio: 2 (calc) (ref 1.0–2.5)
ALT: 18 U/L (ref 6–29)
AST: 22 U/L (ref 10–35)
Albumin: 4.5 g/dL (ref 3.6–5.1)
Alkaline phosphatase (APISO): 79 U/L (ref 37–153)
BUN: 11 mg/dL (ref 7–25)
CO2: 25 mmol/L (ref 20–32)
Calcium: 9.7 mg/dL (ref 8.6–10.4)
Chloride: 108 mmol/L (ref 98–110)
Creat: 0.78 mg/dL (ref 0.50–1.05)
Globulin: 2.2 g/dL (ref 1.9–3.7)
Glucose, Bld: 78 mg/dL (ref 65–99)
Potassium: 4.2 mmol/L (ref 3.5–5.3)
Sodium: 140 mmol/L (ref 135–146)
Total Bilirubin: 0.3 mg/dL (ref 0.2–1.2)
Total Protein: 6.7 g/dL (ref 6.1–8.1)
eGFR: 87 mL/min/{1.73_m2} (ref >=60)

## 2023-05-05 LAB — HIV ANTIBODY (ROUTINE TESTING W REFLEX): HIV 1&2 Ab, 4th Generation: NONREACTIVE

## 2023-05-05 LAB — HEPATITIS B SURFACE ANTIGEN: Hepatitis B Surface Ag: NONREACTIVE

## 2023-05-05 LAB — HEPATITIS A ANTIBODY, TOTAL: Hepatitis A AB,Total: NONREACTIVE

## 2023-05-05 LAB — HEPATITIS C AB W/RFL RNA, PCR + GENO: Hepatitis C Ab: NONREACTIVE

## 2023-05-05 LAB — HEPATITIS B SURFACE ANTIBODY, QUANTITATIVE: Hep B S AB Quant (Post): <5 m[IU]/mL — ABNORMAL LOW (ref >=10)

## 2023-05-08 ENCOUNTER — Encounter: Payer: Self-pay | Admitting: Infectious Disease

## 2023-06-16 DIAGNOSIS — K146 Glossodynia: Secondary | ICD-10-CM | POA: Diagnosis not present

## 2023-06-24 ENCOUNTER — Other Ambulatory Visit (HOSPITAL_BASED_OUTPATIENT_CLINIC_OR_DEPARTMENT_OTHER): Payer: Self-pay

## 2023-06-25 ENCOUNTER — Other Ambulatory Visit (HOSPITAL_BASED_OUTPATIENT_CLINIC_OR_DEPARTMENT_OTHER): Payer: Self-pay

## 2023-06-25 MED ORDER — TOPIRAMATE ER 200 MG PO CAP24
400.0000 mg | ORAL_CAPSULE | Freq: Every day | ORAL | 11 refills | Status: DC
  Filled 2023-06-25: qty 60, 30d supply, fill #0
  Filled 2023-07-09 – 2023-07-22 (×2): qty 60, 30d supply, fill #1
  Filled 2023-08-15: qty 60, 30d supply, fill #2
  Filled 2023-09-20: qty 60, 30d supply, fill #3
  Filled 2023-10-25: qty 60, 30d supply, fill #4
  Filled 2023-11-22: qty 60, 30d supply, fill #5
  Filled 2023-12-22 – 2024-01-02 (×2): qty 60, 30d supply, fill #6

## 2023-06-26 ENCOUNTER — Other Ambulatory Visit (HOSPITAL_BASED_OUTPATIENT_CLINIC_OR_DEPARTMENT_OTHER): Payer: Self-pay

## 2023-06-27 ENCOUNTER — Other Ambulatory Visit (HOSPITAL_BASED_OUTPATIENT_CLINIC_OR_DEPARTMENT_OTHER): Payer: Self-pay

## 2023-07-08 ENCOUNTER — Other Ambulatory Visit (HOSPITAL_BASED_OUTPATIENT_CLINIC_OR_DEPARTMENT_OTHER): Payer: Self-pay

## 2023-07-09 ENCOUNTER — Other Ambulatory Visit (HOSPITAL_BASED_OUTPATIENT_CLINIC_OR_DEPARTMENT_OTHER): Payer: Self-pay

## 2023-07-15 ENCOUNTER — Other Ambulatory Visit (HOSPITAL_BASED_OUTPATIENT_CLINIC_OR_DEPARTMENT_OTHER): Payer: Self-pay

## 2023-07-23 ENCOUNTER — Other Ambulatory Visit: Payer: Self-pay

## 2023-07-23 ENCOUNTER — Other Ambulatory Visit (HOSPITAL_BASED_OUTPATIENT_CLINIC_OR_DEPARTMENT_OTHER): Payer: Self-pay

## 2023-08-17 ENCOUNTER — Other Ambulatory Visit (HOSPITAL_BASED_OUTPATIENT_CLINIC_OR_DEPARTMENT_OTHER): Payer: Self-pay

## 2023-08-17 ENCOUNTER — Other Ambulatory Visit: Payer: Self-pay

## 2023-08-19 DIAGNOSIS — I7300 Raynaud's syndrome without gangrene: Secondary | ICD-10-CM | POA: Diagnosis not present

## 2023-08-19 DIAGNOSIS — M359 Systemic involvement of connective tissue, unspecified: Secondary | ICD-10-CM | POA: Diagnosis not present

## 2023-08-19 DIAGNOSIS — R040 Epistaxis: Secondary | ICD-10-CM | POA: Diagnosis not present

## 2023-08-19 DIAGNOSIS — D72818 Other decreased white blood cell count: Secondary | ICD-10-CM | POA: Diagnosis not present

## 2023-08-23 ENCOUNTER — Encounter (HOSPITAL_BASED_OUTPATIENT_CLINIC_OR_DEPARTMENT_OTHER): Admitting: Radiology

## 2023-08-25 ENCOUNTER — Other Ambulatory Visit (HOSPITAL_BASED_OUTPATIENT_CLINIC_OR_DEPARTMENT_OTHER): Payer: Self-pay | Admitting: Obstetrics

## 2023-08-25 DIAGNOSIS — Z1231 Encounter for screening mammogram for malignant neoplasm of breast: Secondary | ICD-10-CM

## 2023-09-07 ENCOUNTER — Encounter (HOSPITAL_BASED_OUTPATIENT_CLINIC_OR_DEPARTMENT_OTHER): Payer: Self-pay | Admitting: Radiology

## 2023-09-08 ENCOUNTER — Encounter (HOSPITAL_BASED_OUTPATIENT_CLINIC_OR_DEPARTMENT_OTHER): Payer: Self-pay | Admitting: Radiology

## 2023-09-08 ENCOUNTER — Ambulatory Visit (HOSPITAL_BASED_OUTPATIENT_CLINIC_OR_DEPARTMENT_OTHER)
Admission: RE | Admit: 2023-09-08 | Discharge: 2023-09-08 | Disposition: A | Discharge status: Home / Self Care | Payer: Self-pay | Source: Ambulatory Visit | Attending: Family Medicine | Admitting: Family Medicine

## 2023-09-08 DIAGNOSIS — Z1231 Encounter for screening mammogram for malignant neoplasm of breast: Secondary | ICD-10-CM | POA: Diagnosis not present

## 2023-09-21 ENCOUNTER — Other Ambulatory Visit (HOSPITAL_BASED_OUTPATIENT_CLINIC_OR_DEPARTMENT_OTHER): Payer: Self-pay

## 2023-09-22 ENCOUNTER — Other Ambulatory Visit: Payer: Self-pay

## 2023-09-22 DIAGNOSIS — G245 Blepharospasm: Secondary | ICD-10-CM | POA: Diagnosis not present

## 2023-09-29 ENCOUNTER — Ambulatory Visit
Admission: RE | Admit: 2023-09-29 | Discharge: 2023-09-29 | Disposition: A | Discharge status: Home / Self Care | Payer: BC Managed Care – PPO | Source: Ambulatory Visit | Attending: Obstetrics | Admitting: Obstetrics

## 2023-09-29 DIAGNOSIS — N958 Other specified menopausal and perimenopausal disorders: Secondary | ICD-10-CM | POA: Diagnosis not present

## 2023-09-29 DIAGNOSIS — M8588 Other specified disorders of bone density and structure, other site: Secondary | ICD-10-CM | POA: Diagnosis not present

## 2023-09-29 DIAGNOSIS — M8580 Other specified disorders of bone density and structure, unspecified site: Secondary | ICD-10-CM

## 2023-10-01 DIAGNOSIS — M8580 Other specified disorders of bone density and structure, unspecified site: Secondary | ICD-10-CM | POA: Diagnosis not present

## 2023-10-02 DIAGNOSIS — H1010 Acute atopic conjunctivitis, unspecified eye: Secondary | ICD-10-CM | POA: Diagnosis not present

## 2023-10-05 DIAGNOSIS — D72818 Other decreased white blood cell count: Secondary | ICD-10-CM | POA: Diagnosis not present

## 2023-10-05 DIAGNOSIS — R040 Epistaxis: Secondary | ICD-10-CM | POA: Diagnosis not present

## 2023-10-05 DIAGNOSIS — I7300 Raynaud's syndrome without gangrene: Secondary | ICD-10-CM | POA: Diagnosis not present

## 2023-10-05 DIAGNOSIS — M359 Systemic involvement of connective tissue, unspecified: Secondary | ICD-10-CM | POA: Diagnosis not present

## 2023-10-12 DIAGNOSIS — H5203 Hypermetropia, bilateral: Secondary | ICD-10-CM | POA: Diagnosis not present

## 2023-10-15 DIAGNOSIS — M25521 Pain in right elbow: Secondary | ICD-10-CM | POA: Diagnosis not present

## 2023-10-26 ENCOUNTER — Other Ambulatory Visit: Payer: Self-pay

## 2023-10-26 ENCOUNTER — Other Ambulatory Visit (HOSPITAL_BASED_OUTPATIENT_CLINIC_OR_DEPARTMENT_OTHER): Payer: Self-pay

## 2023-10-26 DIAGNOSIS — M25521 Pain in right elbow: Secondary | ICD-10-CM | POA: Diagnosis not present

## 2023-10-28 ENCOUNTER — Other Ambulatory Visit (HOSPITAL_BASED_OUTPATIENT_CLINIC_OR_DEPARTMENT_OTHER): Payer: Self-pay

## 2023-11-02 DIAGNOSIS — M25521 Pain in right elbow: Secondary | ICD-10-CM | POA: Diagnosis not present

## 2023-11-09 DIAGNOSIS — M25521 Pain in right elbow: Secondary | ICD-10-CM | POA: Diagnosis not present

## 2023-11-09 DIAGNOSIS — L438 Other lichen planus: Secondary | ICD-10-CM | POA: Diagnosis not present

## 2023-11-09 DIAGNOSIS — D1801 Hemangioma of skin and subcutaneous tissue: Secondary | ICD-10-CM | POA: Diagnosis not present

## 2023-11-10 DIAGNOSIS — G5621 Lesion of ulnar nerve, right upper limb: Secondary | ICD-10-CM | POA: Diagnosis not present

## 2023-11-20 DIAGNOSIS — M25521 Pain in right elbow: Secondary | ICD-10-CM | POA: Diagnosis not present

## 2023-11-23 ENCOUNTER — Other Ambulatory Visit: Payer: Self-pay

## 2023-11-25 DIAGNOSIS — L821 Other seborrheic keratosis: Secondary | ICD-10-CM | POA: Diagnosis not present

## 2023-11-25 DIAGNOSIS — L819 Disorder of pigmentation, unspecified: Secondary | ICD-10-CM | POA: Diagnosis not present

## 2023-11-25 DIAGNOSIS — D1801 Hemangioma of skin and subcutaneous tissue: Secondary | ICD-10-CM | POA: Diagnosis not present

## 2023-12-16 DIAGNOSIS — Z0000 Encounter for general adult medical examination without abnormal findings: Secondary | ICD-10-CM | POA: Diagnosis not present

## 2023-12-16 DIAGNOSIS — E039 Hypothyroidism, unspecified: Secondary | ICD-10-CM | POA: Diagnosis not present

## 2023-12-16 DIAGNOSIS — E559 Vitamin D deficiency, unspecified: Secondary | ICD-10-CM | POA: Diagnosis not present

## 2023-12-16 DIAGNOSIS — Z79899 Other long term (current) drug therapy: Secondary | ICD-10-CM | POA: Diagnosis not present

## 2023-12-16 DIAGNOSIS — E7800 Pure hypercholesterolemia, unspecified: Secondary | ICD-10-CM | POA: Diagnosis not present

## 2023-12-22 ENCOUNTER — Other Ambulatory Visit (HOSPITAL_BASED_OUTPATIENT_CLINIC_OR_DEPARTMENT_OTHER): Payer: Self-pay

## 2023-12-23 ENCOUNTER — Other Ambulatory Visit (HOSPITAL_BASED_OUTPATIENT_CLINIC_OR_DEPARTMENT_OTHER): Payer: Self-pay

## 2023-12-24 DIAGNOSIS — G5621 Lesion of ulnar nerve, right upper limb: Secondary | ICD-10-CM | POA: Diagnosis not present

## 2023-12-25 ENCOUNTER — Other Ambulatory Visit (HOSPITAL_BASED_OUTPATIENT_CLINIC_OR_DEPARTMENT_OTHER): Payer: Self-pay

## 2023-12-25 DIAGNOSIS — H35363 Drusen (degenerative) of macula, bilateral: Secondary | ICD-10-CM | POA: Diagnosis not present

## 2023-12-25 DIAGNOSIS — H25813 Combined forms of age-related cataract, bilateral: Secondary | ICD-10-CM | POA: Diagnosis not present

## 2023-12-25 DIAGNOSIS — Z79899 Other long term (current) drug therapy: Secondary | ICD-10-CM | POA: Diagnosis not present

## 2023-12-31 ENCOUNTER — Encounter (HOSPITAL_BASED_OUTPATIENT_CLINIC_OR_DEPARTMENT_OTHER): Payer: Self-pay

## 2023-12-31 DIAGNOSIS — G5621 Lesion of ulnar nerve, right upper limb: Secondary | ICD-10-CM | POA: Diagnosis not present

## 2024-01-01 ENCOUNTER — Other Ambulatory Visit (HOSPITAL_BASED_OUTPATIENT_CLINIC_OR_DEPARTMENT_OTHER): Payer: Self-pay

## 2024-01-02 ENCOUNTER — Other Ambulatory Visit (HOSPITAL_BASED_OUTPATIENT_CLINIC_OR_DEPARTMENT_OTHER): Payer: Self-pay

## 2024-01-04 ENCOUNTER — Other Ambulatory Visit (HOSPITAL_BASED_OUTPATIENT_CLINIC_OR_DEPARTMENT_OTHER): Payer: Self-pay

## 2024-01-05 ENCOUNTER — Other Ambulatory Visit (HOSPITAL_BASED_OUTPATIENT_CLINIC_OR_DEPARTMENT_OTHER): Payer: Self-pay

## 2024-01-06 ENCOUNTER — Other Ambulatory Visit (HOSPITAL_BASED_OUTPATIENT_CLINIC_OR_DEPARTMENT_OTHER): Payer: Self-pay

## 2024-01-13 ENCOUNTER — Other Ambulatory Visit (HOSPITAL_BASED_OUTPATIENT_CLINIC_OR_DEPARTMENT_OTHER): Payer: Self-pay

## 2024-01-15 ENCOUNTER — Other Ambulatory Visit (HOSPITAL_BASED_OUTPATIENT_CLINIC_OR_DEPARTMENT_OTHER): Payer: Self-pay

## 2024-01-16 ENCOUNTER — Other Ambulatory Visit (HOSPITAL_BASED_OUTPATIENT_CLINIC_OR_DEPARTMENT_OTHER): Payer: Self-pay

## 2024-01-18 ENCOUNTER — Other Ambulatory Visit (HOSPITAL_BASED_OUTPATIENT_CLINIC_OR_DEPARTMENT_OTHER): Payer: Self-pay

## 2024-01-20 ENCOUNTER — Other Ambulatory Visit (HOSPITAL_BASED_OUTPATIENT_CLINIC_OR_DEPARTMENT_OTHER): Payer: Self-pay

## 2024-01-21 ENCOUNTER — Other Ambulatory Visit (HOSPITAL_BASED_OUTPATIENT_CLINIC_OR_DEPARTMENT_OTHER): Payer: Self-pay

## 2024-01-27 ENCOUNTER — Other Ambulatory Visit (HOSPITAL_BASED_OUTPATIENT_CLINIC_OR_DEPARTMENT_OTHER): Payer: Self-pay

## 2024-01-27 ENCOUNTER — Other Ambulatory Visit: Payer: Self-pay

## 2024-01-28 ENCOUNTER — Other Ambulatory Visit (HOSPITAL_BASED_OUTPATIENT_CLINIC_OR_DEPARTMENT_OTHER): Payer: Self-pay

## 2024-01-28 MED ORDER — TOPIRAMATE 100 MG PO TABS
200.0000 mg | ORAL_TABLET | Freq: Two times a day (BID) | ORAL | 2 refills | Status: DC
  Filled 2024-01-28: qty 120, 30d supply, fill #0

## 2024-02-03 ENCOUNTER — Other Ambulatory Visit (HOSPITAL_BASED_OUTPATIENT_CLINIC_OR_DEPARTMENT_OTHER): Payer: Self-pay

## 2024-03-16 DIAGNOSIS — G4711 Idiopathic hypersomnia with long sleep time: Secondary | ICD-10-CM | POA: Diagnosis not present

## 2024-03-16 DIAGNOSIS — Z1330 Encounter for screening examination for mental health and behavioral disorders, unspecified: Secondary | ICD-10-CM | POA: Diagnosis not present

## 2024-03-21 DIAGNOSIS — G5621 Lesion of ulnar nerve, right upper limb: Secondary | ICD-10-CM | POA: Diagnosis not present

## 2024-03-31 DIAGNOSIS — M25521 Pain in right elbow: Secondary | ICD-10-CM | POA: Diagnosis not present

## 2024-04-06 ENCOUNTER — Other Ambulatory Visit (HOSPITAL_COMMUNITY): Payer: Self-pay

## 2024-04-08 DIAGNOSIS — M25521 Pain in right elbow: Secondary | ICD-10-CM | POA: Diagnosis not present

## 2024-04-11 ENCOUNTER — Emergency Department (HOSPITAL_BASED_OUTPATIENT_CLINIC_OR_DEPARTMENT_OTHER): Admitting: Radiology

## 2024-04-11 ENCOUNTER — Emergency Department (HOSPITAL_BASED_OUTPATIENT_CLINIC_OR_DEPARTMENT_OTHER)
Admission: EM | Admit: 2024-04-11 | Discharge: 2024-04-11 | Disposition: A | Discharge status: Home / Self Care | Source: Ambulatory Visit | Attending: Emergency Medicine | Admitting: Emergency Medicine

## 2024-04-11 ENCOUNTER — Other Ambulatory Visit: Payer: Self-pay

## 2024-04-11 DIAGNOSIS — R072 Precordial pain: Secondary | ICD-10-CM | POA: Insufficient documentation

## 2024-04-11 DIAGNOSIS — R079 Chest pain, unspecified: Secondary | ICD-10-CM | POA: Diagnosis not present

## 2024-04-11 DIAGNOSIS — I10 Essential (primary) hypertension: Secondary | ICD-10-CM | POA: Diagnosis not present

## 2024-04-11 DIAGNOSIS — R0789 Other chest pain: Secondary | ICD-10-CM | POA: Diagnosis not present

## 2024-04-11 LAB — CBC
HCT: 40.6 % (ref 36.0–46.0)
Hemoglobin: 13.1 g/dL (ref 12.0–15.0)
MCH: 30.3 pg (ref 26.0–34.0)
MCHC: 32.3 g/dL (ref 30.0–36.0)
MCV: 93.8 fL (ref 80.0–100.0)
Platelets: 227 K/uL (ref 150–400)
RBC: 4.33 MIL/uL (ref 3.87–5.11)
RDW: 12.5 % (ref 11.5–15.5)
WBC: 5 K/uL (ref 4.0–10.5)
nRBC: 0 % (ref 0.0–0.2)

## 2024-04-11 LAB — BASIC METABOLIC PANEL WITH GFR
Anion gap: 11 (ref 5–15)
BUN: 12 mg/dL (ref 8–23)
CO2: 27 mmol/L (ref 22–32)
Calcium: 9.7 mg/dL (ref 8.9–10.3)
Chloride: 101 mmol/L (ref 98–111)
Creatinine, Ser: 0.66 mg/dL (ref 0.44–1.00)
GFR, Estimated: >60 mL/min (ref >60)
Glucose, Bld: 148 mg/dL — ABNORMAL HIGH (ref 70–99)
Potassium: 3.7 mmol/L (ref 3.5–5.1)
Sodium: 139 mmol/L (ref 135–145)

## 2024-04-11 LAB — TROPONIN T, HIGH SENSITIVITY: Troponin T High Sensitivity: <15 ng/L (ref 0–19)

## 2024-04-11 LAB — D-DIMER, QUANTITATIVE: D-Dimer, Quant: <0.27 ug{FEU}/mL (ref 0.00–0.50)

## 2024-04-11 MED ORDER — ACETAMINOPHEN 500 MG PO TABS
1000.0000 mg | ORAL_TABLET | Freq: Once | ORAL | Status: AC
  Administered 2024-04-11: 1000 mg via ORAL
  Filled 2024-04-11: qty 2

## 2024-04-11 MED ORDER — IBUPROFEN 400 MG PO TABS
400.0000 mg | ORAL_TABLET | Freq: Once | ORAL | Status: AC
  Administered 2024-04-11: 400 mg via ORAL
  Filled 2024-04-11: qty 1

## 2024-04-11 NOTE — ED Triage Notes (Addendum)
 Chest pain starting Thursday night. Painful breathing. Ulnar nerve surg 27th Oct- right wrist brace still in place.  Right arm still has some residual changes such as swelling and itching distal from incision site.

## 2024-04-11 NOTE — Discharge Instructions (Addendum)
 It was our pleasure to provide your ER care today - we hope that you feel better. Overall, your ER heart tests look good.   You may take acetaminophen  or ibuprofen  as need.   For recent chest pain, follow up closely with cardiologist in the next 1-2 weeks - we made referral, and they should be contacting you with an appointment in the next few days.   Return to ER right away if worse, new symptoms, fevers, recurrent/persistent chest pain, increased trouble breathing, or other concern.

## 2024-04-11 NOTE — ED Provider Notes (Signed)
 [place] EMERGENCY DEPARTMENT AT [facility] Provider Note   CSN: [id number] Arrival date & time: 04/11/24  1611     Patient presents with: Chest Pain   Cindy Fitzgerald is a 63 y.o. female.   Pt with c/o lower chest pain, mid to left, for past 4 days. Constant, dull, mild non radiating, without specific exacerbating or alleviating factors. No pleuritic pain. ?mild sob. No nv or diaphoresis. Indicates had right elbow surgery last month, and has been using left arm more to pick up things/lift as cannot use right. No new extremity swelling or pain. No hx dvt or pe. No exertional chest pain or discomfort. No heartburn. No hx cad, prior coronary ca score = 0. Parents w hx cva, no hx cad. No cough or uri symptoms. No fever or chills. No abd pain or nv.   The history is provided by the patient and medical records.  Chest Pain Associated symptoms: no abdominal pain, no back pain, no cough, no fever, no headache, no nausea, no palpitations and no vomiting        Prior to Admission medications   Medication Sig Start Date End Date Taking? Authorizing Provider  aspirin 81 MG tablet Take 81 mg by mouth daily.    [provider]  botulinum toxin Type A (BOTOX) 100 units SOLR injection Inject as directed See admin instructions.    [provider]  cetirizine (ZYRTEC) 10 MG tablet Take 10 mg by mouth at bedtime.  Patient not taking: Reported on 05/04/2023    [provider]  Cholecalciferol (VITAMIN D3) 50 MCG (2000 UT) TABS Take 1 capsule by mouth daily.    [provider]  desonide  (DESOWEN ) 0.05 % cream Apply 1 Application to affected area topically up to 2 (two) times daily as needed. 03/09/23     Erenumab-aooe (AIMOVIG, 140 MG DOSE, [place]) Inject 140 mg into the skin every 30 (thirty) days.     [provider]  estradiol (VIVELLE-DOT) 0.025 MG/24HR Place 1 patch onto the skin 2 (two) times a week.    [provider]  fluconazole  (DIFLUCAN) 200 MG tablet Take 200 mg by mouth daily. 04/27/23   [provider]  fluticasone-salmeterol (ADVAIR  HFA) 230-21 MCG/ACT inhaler Inhale 2 puffs into the lungs 2 (two) times daily. Patient not taking: Reported on 05/04/2023 03/08/21   Kassie Acquanetta Bradley, MD  folic acid (FOLVITE) 1 MG tablet Take by mouth. 01/14/20   [provider]  hydroxychloroquine (PLAQUENIL) 200 MG tablet Take 200 mg by mouth at bedtime. Patient not taking: Reported on 05/04/2023    [provider]  ibuprofen  (ADVIL ) 800 MG tablet Take 1 tablet (800 mg total) by mouth every 8 (eight) hours as needed for moderate pain (call if pain is not controlled with ibuprofen ). 02/11/19   Cross, Melissa D, NP  levocetirizine (XYZAL) 5 MG tablet Take 5 mg by mouth every evening.    [provider]  levothyroxine (SYNTHROID) 25 MCG tablet Take 25 mcg by mouth daily before breakfast.     [provider]  magnesium oxide (MAG-OX) 400 MG tablet Take 400 mg by mouth daily.    [provider]  modafinil  (PROVIGIL ) 200 MG tablet Take 200 mg by mouth daily.    [provider]  Multiple Vitamins-Minerals (PRESERVISION AREDS 2) CAPS Take 2 capsules by mouth daily. Early macular degeneration    [provider]  nirmatrelvir  & ritonavir  (PAXLOVID , 150/100,) 10 x 150 MG & 10 x  100MG  TBPK Take 2 tablets by mouth twice daily for 5 days Patient not taking: Reported on 05/04/2023 02/06/23     omeprazole  (PRILOSEC) 40 MG capsule Take 1 capsule (40 mg total) by mouth daily. 03/08/21   Kassie Acquanetta Bradley, MD  Potassium Citrate 15 MEQ (1620 MG) TBCR Take 2 tablets by mouth 2 (two) times daily.  02/23/17   [provider]  Probiotic Product (PROBIOTIC DAILY) CAPS Take 1 capsule by mouth at bedtime.    [provider]  rizatriptan (MAXALT-MLT) 10 MG disintegrating tablet Take 10 mg by mouth as needed for migraine. May repeat in 2 hours if needed    [provider]   topiramate  (TOPAMAX ) 100 MG tablet Take 2 tablets (200 mg total) by mouth 2 (two) times daily. 01/06/24     Topiramate  (TROKENDI  XR PO) Take 400 mg by mouth at bedtime.     [provider]  Topiramate  ER (TROKENDI  XR) 200 MG CP24 Take 2 capsules (400 mg total) by mouth at bedtime. 06/25/23       Allergies: Lamisil [terbinafine], Codeine, Latex, Other, and Penicillins cross reactors    Review of Systems  Constitutional:  Negative for chills and fever.  HENT:  Negative for sore throat.   Eyes:  Negative for redness.  Respiratory:  Negative for cough.   Cardiovascular:  Positive for chest pain. Negative for palpitations and leg swelling.  Gastrointestinal:  Negative for abdominal pain, nausea and vomiting.  Genitourinary:  Negative for flank pain.  Musculoskeletal:  Negative for back pain and neck pain.  Skin:  Negative for rash.  Neurological:  Negative for headaches.    Updated Vital Signs BP (!) 165/73 (BP Location: Left Arm)   Pulse 80   Temp 98.5 F (36.9 C)   Resp 16   SpO2 100%   Physical Exam Vitals and nursing note reviewed.  Constitutional:      Appearance: Normal appearance. She is well-developed.  HENT:     Head: Atraumatic.     Nose: Nose normal.     Mouth/Throat:     Mouth: Mucous membranes are moist.  Eyes:     General: No scleral icterus.    Conjunctiva/sclera: Conjunctivae normal.  Neck:     Trachea: No tracheal deviation.  Cardiovascular:     Rate and Rhythm: Normal rate and regular rhythm.     Pulses: Normal pulses.     Heart sounds: Normal heart sounds. No murmur heard.    No friction rub. No gallop.  Pulmonary:     Effort: Pulmonary effort is normal. No respiratory distress.     Breath sounds: Normal breath sounds.  Chest:     Chest wall: No tenderness.  Abdominal:     General: Bowel sounds are normal. There is no distension.     Palpations: Abdomen is soft.     Tenderness: There is no abdominal tenderness. There is no guarding.   Genitourinary:    Comments: No cva tenderness.  Musculoskeletal:        General: No swelling or tenderness.     Cervical back: Neck supple. No muscular tenderness.     Right lower leg: No edema.     Left lower leg: No edema.     Comments: No RUE edema, no infection to area of prior surgery.   Skin:    General: Skin is warm and dry.     Findings: No rash.  Neurological:     Mental Status: She is alert.  Comments: Alert, speech normal.   Psychiatric:        Mood and Affect: Mood normal.     (all labs ordered are listed, but only abnormal results are displayed) Results for orders placed or performed during the hospital encounter of 04/11/24  CBC   Collection Time: 04/11/24  4:35 PM  Result Value Ref Range   WBC 5.0 4.0 - 10.5 K/uL   RBC 4.33 3.87 - 5.11 MIL/uL   Hemoglobin 13.1 12.0 - 15.0 g/dL   HCT 59.3 63.9 - 53.9 %   MCV 93.8 80.0 - 100.0 fL   MCH 30.3 26.0 - 34.0 pg   MCHC 32.3 30.0 - 36.0 g/dL   RDW 87.4 88.4 - 84.4 %   Platelets 227 150 - 400 K/uL   nRBC 0.0 0.0 - 0.2 %     EKG: EKG Interpretation Date/Time:  Monday April 11 2024 16:19:34 EST Ventricular Rate:  75 PR Interval:  128 QRS Duration:  82 QT Interval:  390 QTC Calculation: 435 R Axis:   72  Text Interpretation: Normal sinus rhythm Nonspecific ST abnormality Confirmed by Bernard Drivers ([id number]) on 04/11/2024 4:22:36 PM  Radiology: No results found.   Procedures   Medications Ordered in the ED - No data to display                                  Medical Decision Making Problems Addressed: Precordial chest pain: acute illness or injury with systemic symptoms that poses a threat to life or bodily functions  Amount and/or Complexity of Data Reviewed External Data Reviewed: notes. Labs: ordered. Decision-making details documented in ED Course. Radiology: ordered and independent interpretation performed. Decision-making details documented in ED Course.  Risk OTC drugs. Prescription  drug management. Decision regarding hospitalization.   Iv ns. Continuous pulse ox and cardiac monitoring. Labs ordered/sent. Imaging ordered.   Differential diagnosis includes acsm msk cp, gi cp, etc. Dispo decision including potential need for admission considered - will get labs and imaging and reassess.   Reviewed nursing notes and prior charts for additional history. External reports reviewed. Coronary ca score = 0 in 2022.   Acetaminophen  po, ibuprofen  po.   Cardiac monitor: sinus rhythm, rate 77.  Labs reviewed/interpreted by me - wbc and hgb normal. Trop normal - after symptoms present/constant for 3-4 days, felt not c/w acs. Ddimer is normal.  Xrays reviewed/interpreted by me - no pna.   ED workup appears reassuring. ?whether possibly msk cp vs other cause. Currently breathing comfortably, rr 14, pulse ox 100%. No abd pain or tenderness. Chest cta.   Pt currently appears stable for ed d/c.   Rec close pcp/card f/u.  Return precautions provided.       Final diagnoses:  None    ED Discharge Orders     None          Bernard Drivers, MD 04/11/24 1751

## 2024-04-13 ENCOUNTER — Telehealth: Payer: Self-pay | Admitting: Emergency Medicine

## 2024-04-13 NOTE — Telephone Encounter (Signed)
 Encounter made in error.

## 2024-04-15 DIAGNOSIS — M25521 Pain in right elbow: Secondary | ICD-10-CM | POA: Diagnosis not present

## 2024-04-19 ENCOUNTER — Ambulatory Visit: Attending: Cardiovascular Disease | Admitting: Cardiovascular Disease

## 2024-04-19 ENCOUNTER — Encounter: Payer: Self-pay | Admitting: Cardiovascular Disease

## 2024-04-19 VITALS — BP 140/70 | HR 78 | Resp 17 | Ht 65.0 in | Wt 126.0 lb

## 2024-04-19 DIAGNOSIS — R072 Precordial pain: Secondary | ICD-10-CM

## 2024-04-19 MED ORDER — METOPROLOL TARTRATE 50 MG PO TABS: 50.0000 mg | ORAL_TABLET | Freq: Once | ORAL | 0 refills | Status: AC

## 2024-04-19 NOTE — Progress Notes (Unsigned)
 Cardiology Office Note   Date:  04/21/2024  ID:  Cindy Fitzgerald, DOB [birth year], MRN [id number] PCP: Verena Mems, MD  [facility] Providers Cardiologist:  None     History of Present Illness Cindy Fitzgerald is a 63 y.o. female with a history of migraine headaches and treated hypothyroidism but otherwise excellent health who was recently evaluated in the emergency room with complaints of chest pain.  She is accompanied by her husband.  She describes the chest discomfort as occurring just under her left breast immediately lateral to the sternum with a sensation of tightness, dull and localized.  It does not occur during physical activity but occurs repeatedly and unexpectedly throughout the day.  It lasts for a minute or so at a time.  It has not occurred before in the setting of physical activity and is not clear whether physical activity now makes it any worse.  It is not reproducible with palpation.  It seems to get better when she lies back, rather than when she is leaning forward.  It does not change with breathing or cough and does not appear to have any relationship with meals.  She was recently seen in the emergency room 04/11/2024 with complaints of this chest pain going on for about 4 days  ECG during that visit was normal, as were the high-sensitivity troponin assays.  D-dimer was undetectable and WBC were also normal (although interestingly she often appears to be leukopenic with a WBC in the 2.5-3 range).  Chest x-ray was unremarkable.  She is still having chest discomfort today, off and on during our exam, and another ECG performed during symptoms is once again normal.  In 2022 her coronary calcium score was 0.  She had a normal stress echo in 2015.  Her father, who is also my patient has a history of stroke and atrial fibrillation but does not have coronary disease.   ROS: Denies exertional dyspnea, orthopnea, PND, cough, hemoptysis, fever or chills, leg edema,  palpitations, dizziness, syncope or focal neurological complaints.  No GI symptoms.  Studies Reviewed EKG Interpretation Date/Time:  Tuesday April 19 2024 10:16:59 EST Ventricular Rate:  73 PR Interval:  106 QRS Duration:  84 QT Interval:  378 QTC Calculation: 416 R Axis:   57  Text Interpretation: Sinus rhythm with short PR When compared with ECG of 11-Apr-2024 16:19, No significant change was found Confirmed by Amiylah Anastos [phone]) on 04/19/2024 12:02:14 PM    Review of notes, ECG, chest x-ray, labs from recent ER visit Risk Assessment/Calculations           Physical Exam VS:  BP 136/68   Pulse 78   Resp 17   Ht 5' 5 (1.651 m)   Wt 126 lb (57.2 kg)   SpO2 96%   BMI 20.97 kg/m        Wt Readings from Last 3 Encounters:  04/19/24 126 lb (57.2 kg)  05/04/23 116 lb (52.6 kg)  03/08/21 115 lb 6.4 oz (52.3 kg)    GEN: Well nourished, well developed in no acute distress.  She is quite lean. NECK: No JVD; No carotid bruits CARDIAC: RRR, no murmurs, rubs, gallops RESPIRATORY:  Clear to auscultation without rales, wheezing or rhonchi  ABDOMEN: Soft, non-tender, non-distended EXTREMITIES:  No edema; No deformity   ASSESSMENT AND PLAN Her chest pain syndrome does not really fit with classic coronary disease, but conceivably it could be a symptom of coronary spasm in a patient with a longstanding history  of migraines.  The symptoms do not sound like pericarditis and generally do not have a pleuritic pattern.  Esophageal spasm is a possibility, but she does not have prominent GI symptoms.  Coronary calcium score of 0 generally portends good prognosis, but we discussed the fact that soft plaque may go undetected and still cause coronary events.  Will schedule for coronary CT angiography.  Thankfully, her workup does not suggest high risk for acute coronary syndrome.  Consider empirical treatment with a vasodilator calcium channel blocker such as verapamil or diltiazem to  prevent coronary spasm, which can also be used for migraine prevention in some patients.       Dispo: Check coronary CT angiography.    Signed, Jerel Balding, MD

## 2024-04-19 NOTE — Patient Instructions (Signed)
 Medication Instructions:  No changes *If you need a refill on your cardiac medications before your next appointment, please call your pharmacy*  Lab Work: None ordered If you have labs (blood work) drawn today and your tests are completely normal, you will receive your results only by: MyChart Message (if you have MyChart) OR A paper copy in the mail If you have any lab test that is abnormal or we need to change your treatment, we will call you to review the results.  Testing/Procedures:   Your cardiac CT will be scheduled at:   [facility] [address]  [place], [place] [address]  If scheduled at the [facility] at [address], please enter the parking lot using the [address] entrance and use the FREE valet service at the patient drop-off area. Enter the building and check-in with registration on the main floor.    Please follow these instructions carefully (unless otherwise directed):  An IV will be required for this test and Nitroglycerin will be given.    On the Night Before the Test: Be sure to Drink plenty of water. Do not consume any caffeinated/decaffeinated beverages or chocolate 12 hours prior to your test. Do not take any antihistamines 12 hours prior to your test.  On the Day of the Test: Drink plenty of water until 1 hour prior to the test. Do not eat any food 1 hour prior to test. You may take your regular medications prior to the test.  Take metoprolol  50 mg (Lopressor ) two hours prior to test. If you take Furosemide/Hydrochlorothiazide/Spironolactone/Chlorthalidone, please HOLD on the morning of the test. Patients who wear a continuous glucose monitor MUST remove the device prior to scanning. FEMALES- please wear underwire-free bra if available, avoid dresses & tight clothing   After the Test: Drink plenty of water. After receiving IV contrast, you may experience a mild flushed feeling. This is  normal. On occasion, you may experience a mild rash up to 24 hours after the test. This is not dangerous. If this occurs, you can take Benadryl  25 mg, Zyrtec, Claritin, or Allegra and increase your fluid intake. (Patients taking Tikosyn should avoid Benadryl , and may take Zyrtec, Claritin, or Allegra) If you experience trouble breathing, this can be serious. If it is severe call 911 IMMEDIATELY. If it is mild, please call our office.  We will call to schedule your test 2-4 weeks out understanding that some insurance companies will need an authorization prior to the service being performed.   For more information and frequently asked questions, please visit our website : [email or web address]  For non-scheduling related questions, please contact the cardiac imaging nurse navigator should you have any questions/concerns: Cardiac Imaging Nurse Navigators Direct Office Dial: [phone]   For scheduling needs, including cancellations and rescheduling, please call Brittany, [phone].   Follow-Up: At [facility], you and your health needs are our priority.  As part of our continuing mission to provide you with exceptional heart care, our providers are all part of one team.  This team includes your primary Cardiologist (physician) and Advanced Practice Providers or APPs (Physician Assistants and Nurse Practitioners) who all work together to provide you with the care you need, when you need it.  Your next appointment:   3 month(s)  Provider:   Dr Francyne  We recommend signing up for the patient portal called MyChart.  Sign up information is provided on this After Visit Summary.  MyChart is used to connect  with patients for Virtual Visits (Telemedicine).  Patients are able to view lab/test results, encounter notes, upcoming appointments, etc.  Non-urgent messages can be sent to your provider as well.   To learn more about what you can do with MyChart, go to  [email or web address].

## 2024-04-29 DIAGNOSIS — M25521 Pain in right elbow: Secondary | ICD-10-CM | POA: Diagnosis not present

## 2024-05-06 ENCOUNTER — Encounter (HOSPITAL_COMMUNITY): Payer: Self-pay

## 2024-05-06 DIAGNOSIS — M25521 Pain in right elbow: Secondary | ICD-10-CM | POA: Diagnosis not present

## 2024-05-10 ENCOUNTER — Ambulatory Visit: Payer: Self-pay | Admitting: Cardiovascular Disease

## 2024-05-10 ENCOUNTER — Ambulatory Visit (HOSPITAL_COMMUNITY)
Admission: RE | Admit: 2024-05-10 | Discharge: 2024-05-10 | Payer: Self-pay | Attending: Cardiovascular Disease | Admitting: Cardiovascular Disease

## 2024-05-10 DIAGNOSIS — R072 Precordial pain: Secondary | ICD-10-CM

## 2024-05-10 MED ORDER — NITROGLYCERIN 0.4 MG SL SUBL
0.8000 mg | SUBLINGUAL_TABLET | Freq: Once | SUBLINGUAL | Status: AC
  Administered 2024-05-10: 13:00:00 0.8 mg via SUBLINGUAL

## 2024-05-10 MED ORDER — IOHEXOL 350 MG/ML SOLN
100.0000 mL | Freq: Once | INTRAVENOUS | Status: AC | PRN
  Administered 2024-05-10: 13:00:00 100 mL via INTRAVENOUS

## 2024-05-11 DIAGNOSIS — M25521 Pain in right elbow: Secondary | ICD-10-CM | POA: Diagnosis not present

## 2024-07-20 ENCOUNTER — Ambulatory Visit: Payer: Self-pay | Admitting: Cardiovascular Disease
